# Patient Record
Sex: Female | Born: 1960 | Race: Black or African American | Hispanic: No | Marital: Married | Smoking: Never smoker
Health system: Southern US, Community
[De-identification: ages and names within clinical notes are randomized; demographics above are authoritative.]

## PROBLEM LIST (undated history)

## (undated) DIAGNOSIS — E559 Vitamin D deficiency, unspecified: Secondary | ICD-10-CM

## (undated) DIAGNOSIS — R0602 Shortness of breath: Secondary | ICD-10-CM

## (undated) DIAGNOSIS — K829 Disease of gallbladder, unspecified: Secondary | ICD-10-CM

## (undated) DIAGNOSIS — M549 Dorsalgia, unspecified: Secondary | ICD-10-CM

## (undated) DIAGNOSIS — R739 Hyperglycemia, unspecified: Secondary | ICD-10-CM

## (undated) DIAGNOSIS — E785 Hyperlipidemia, unspecified: Secondary | ICD-10-CM

## (undated) DIAGNOSIS — R7303 Prediabetes: Secondary | ICD-10-CM

## (undated) DIAGNOSIS — D259 Leiomyoma of uterus, unspecified: Secondary | ICD-10-CM

## (undated) DIAGNOSIS — D649 Anemia, unspecified: Secondary | ICD-10-CM

## (undated) DIAGNOSIS — D509 Iron deficiency anemia, unspecified: Secondary | ICD-10-CM

## (undated) DIAGNOSIS — N920 Excessive and frequent menstruation with regular cycle: Secondary | ICD-10-CM

## (undated) HISTORY — DX: Shortness of breath: R06.02

## (undated) HISTORY — DX: Excessive and frequent menstruation with regular cycle: N92.0

## (undated) HISTORY — DX: Disease of gallbladder, unspecified: K82.9

## (undated) HISTORY — DX: Leiomyoma of uterus, unspecified: D25.9

## (undated) HISTORY — DX: Dorsalgia, unspecified: M54.9

## (undated) HISTORY — DX: Anemia, unspecified: D64.9

## (undated) HISTORY — DX: Iron deficiency anemia, unspecified: D50.9

## (undated) HISTORY — PX: TUBAL LIGATION: SHX77

## (undated) HISTORY — DX: Hyperglycemia, unspecified: R73.9

---
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
## 1997-09-22 ENCOUNTER — Other Ambulatory Visit: Admission: RE | Admit: 1997-09-22 | Discharge: 1997-09-22 | Payer: Self-pay | Admitting: Obstetrics and Gynecology

## 1998-04-01 ENCOUNTER — Other Ambulatory Visit: Admission: RE | Admit: 1998-04-01 | Discharge: 1998-04-01 | Payer: Self-pay | Admitting: *Deleted

## 1998-06-16 HISTORY — PX: OTHER SURGICAL HISTORY: SHX169

## 1998-12-10 ENCOUNTER — Other Ambulatory Visit: Admission: RE | Admit: 1998-12-10 | Discharge: 1998-12-10 | Payer: Self-pay | Admitting: Obstetrics and Gynecology

## 1998-12-11 ENCOUNTER — Other Ambulatory Visit: Admission: RE | Admit: 1998-12-11 | Discharge: 1998-12-11 | Payer: Self-pay | Admitting: Obstetrics and Gynecology

## 1999-09-13 ENCOUNTER — Other Ambulatory Visit: Admission: RE | Admit: 1999-09-13 | Discharge: 1999-09-13 | Payer: Self-pay | Admitting: Family Medicine

## 1999-12-03 ENCOUNTER — Other Ambulatory Visit: Admission: RE | Admit: 1999-12-03 | Discharge: 1999-12-03 | Payer: Self-pay | Admitting: Obstetrics and Gynecology

## 2001-08-08 ENCOUNTER — Ambulatory Visit (HOSPITAL_COMMUNITY): Admission: RE | Admit: 2001-08-08 | Discharge: 2001-08-08 | Payer: Self-pay | Admitting: Obstetrics and Gynecology

## 2001-08-08 ENCOUNTER — Encounter: Payer: Self-pay | Admitting: Obstetrics and Gynecology

## 2001-09-03 ENCOUNTER — Other Ambulatory Visit: Admission: RE | Admit: 2001-09-03 | Discharge: 2001-09-03 | Payer: Self-pay | Admitting: Obstetrics and Gynecology

## 2003-01-14 ENCOUNTER — Other Ambulatory Visit: Admission: RE | Admit: 2003-01-14 | Discharge: 2003-01-14 | Payer: Self-pay | Admitting: Obstetrics and Gynecology

## 2003-01-21 ENCOUNTER — Ambulatory Visit (HOSPITAL_COMMUNITY): Admission: RE | Admit: 2003-01-21 | Discharge: 2003-01-21 | Payer: Self-pay | Admitting: Obstetrics and Gynecology

## 2003-01-21 ENCOUNTER — Encounter: Payer: Self-pay | Admitting: Obstetrics and Gynecology

## 2004-05-19 ENCOUNTER — Ambulatory Visit: Payer: Self-pay | Admitting: Family Medicine

## 2004-09-22 ENCOUNTER — Ambulatory Visit: Payer: Self-pay | Admitting: Family Medicine

## 2004-11-22 ENCOUNTER — Other Ambulatory Visit: Admission: RE | Admit: 2004-11-22 | Discharge: 2004-11-22 | Payer: Self-pay | Admitting: Family Medicine

## 2004-11-22 ENCOUNTER — Encounter: Payer: Self-pay | Admitting: Family Medicine

## 2004-11-22 ENCOUNTER — Ambulatory Visit: Payer: Self-pay | Admitting: Family Medicine

## 2005-07-19 ENCOUNTER — Ambulatory Visit (HOSPITAL_COMMUNITY): Admission: RE | Admit: 2005-07-19 | Discharge: 2005-07-19 | Payer: Self-pay | Admitting: *Deleted

## 2006-03-06 ENCOUNTER — Ambulatory Visit: Payer: Self-pay | Admitting: Family Medicine

## 2006-03-14 ENCOUNTER — Ambulatory Visit: Payer: Self-pay | Admitting: Family Medicine

## 2006-03-20 ENCOUNTER — Ambulatory Visit (HOSPITAL_COMMUNITY): Admission: RE | Admit: 2006-03-20 | Discharge: 2006-03-20 | Payer: Self-pay | Admitting: *Deleted

## 2007-02-27 ENCOUNTER — Encounter: Payer: Self-pay | Admitting: Family Medicine

## 2007-02-27 DIAGNOSIS — N920 Excessive and frequent menstruation with regular cycle: Secondary | ICD-10-CM

## 2007-02-27 DIAGNOSIS — J309 Allergic rhinitis, unspecified: Secondary | ICD-10-CM | POA: Insufficient documentation

## 2007-02-27 DIAGNOSIS — D509 Iron deficiency anemia, unspecified: Secondary | ICD-10-CM | POA: Insufficient documentation

## 2007-03-19 ENCOUNTER — Ambulatory Visit: Payer: Self-pay | Admitting: Family Medicine

## 2007-03-19 ENCOUNTER — Other Ambulatory Visit: Admission: RE | Admit: 2007-03-19 | Discharge: 2007-03-19 | Payer: Self-pay | Admitting: Family Medicine

## 2007-03-19 ENCOUNTER — Encounter (INDEPENDENT_AMBULATORY_CARE_PROVIDER_SITE_OTHER): Payer: Self-pay | Admitting: Internal Medicine

## 2007-03-19 DIAGNOSIS — E669 Obesity, unspecified: Secondary | ICD-10-CM

## 2007-03-19 DIAGNOSIS — N926 Irregular menstruation, unspecified: Secondary | ICD-10-CM

## 2007-03-21 LAB — CONVERTED CEMR LAB
ALT: 21 units/L (ref 0–35)
BUN: 5 mg/dL — ABNORMAL LOW (ref 6–23)
Basophils Absolute: 0 10*3/uL (ref 0.0–0.1)
CO2: 29 meq/L (ref 19–32)
Chloride: 103 meq/L (ref 96–112)
Eosinophils Absolute: 0.1 10*3/uL (ref 0.0–0.6)
Eosinophils Relative: 0.9 % (ref 0.0–5.0)
GFR calc Af Amer: 116 mL/min
Glucose, Bld: 104 mg/dL — ABNORMAL HIGH (ref 70–99)
HCT: 38.2 % (ref 36.0–46.0)
Hemoglobin: 13.2 g/dL (ref 12.0–15.0)
MCHC: 34.5 g/dL (ref 30.0–36.0)
MCV: 79.3 fL (ref 78.0–100.0)
Neutro Abs: 5.4 10*3/uL (ref 1.4–7.7)
Potassium: 3.7 meq/L (ref 3.5–5.1)
RBC: 4.82 M/uL (ref 3.87–5.11)
Sodium: 138 meq/L (ref 135–145)
Total CHOL/HDL Ratio: 4.8
Triglycerides: 71 mg/dL (ref 0–149)

## 2007-03-23 ENCOUNTER — Encounter (INDEPENDENT_AMBULATORY_CARE_PROVIDER_SITE_OTHER): Payer: Self-pay | Admitting: *Deleted

## 2007-03-26 ENCOUNTER — Encounter: Admission: RE | Admit: 2007-03-26 | Discharge: 2007-03-26 | Payer: Self-pay | Admitting: Family Medicine

## 2007-04-02 ENCOUNTER — Encounter: Admission: RE | Admit: 2007-04-02 | Discharge: 2007-04-02 | Payer: Self-pay | Admitting: Family Medicine

## 2007-04-04 ENCOUNTER — Encounter (INDEPENDENT_AMBULATORY_CARE_PROVIDER_SITE_OTHER): Payer: Self-pay | Admitting: *Deleted

## 2007-12-17 ENCOUNTER — Ambulatory Visit: Payer: Self-pay | Admitting: Family Medicine

## 2007-12-17 DIAGNOSIS — N926 Irregular menstruation, unspecified: Secondary | ICD-10-CM | POA: Insufficient documentation

## 2007-12-17 DIAGNOSIS — N939 Abnormal uterine and vaginal bleeding, unspecified: Secondary | ICD-10-CM

## 2007-12-18 LAB — CONVERTED CEMR LAB
Basophils Absolute: 0.1 10*3/uL (ref 0.0–0.1)
Eosinophils Absolute: 0.1 10*3/uL (ref 0.0–0.7)
Eosinophils Relative: 1.6 % (ref 0.0–5.0)
HCT: 32.2 % — ABNORMAL LOW (ref 36.0–46.0)
Lymphocytes Relative: 26.1 % (ref 12.0–46.0)
MCHC: 33.9 g/dL (ref 30.0–36.0)
MCV: 77.5 fL — ABNORMAL LOW (ref 78.0–100.0)
Monocytes Absolute: 0.4 10*3/uL (ref 0.1–1.0)
Neutrophils Relative %: 65.4 % (ref 43.0–77.0)
Platelets: 328 10*3/uL (ref 150–400)

## 2008-01-15 ENCOUNTER — Ambulatory Visit: Payer: Self-pay | Admitting: Family Medicine

## 2008-01-16 LAB — CONVERTED CEMR LAB
Basophils Relative: 1 % (ref 0.0–3.0)
Lymphocytes Relative: 29.7 % (ref 12.0–46.0)
Monocytes Absolute: 0.5 10*3/uL (ref 0.1–1.0)
Monocytes Relative: 6.8 % (ref 3.0–12.0)
Neutro Abs: 4.4 10*3/uL (ref 1.4–7.7)
Platelets: 354 10*3/uL (ref 150–400)
RDW: 14 % (ref 11.5–14.6)

## 2008-04-16 ENCOUNTER — Ambulatory Visit: Payer: Self-pay | Admitting: Internal Medicine

## 2008-04-16 LAB — CONVERTED CEMR LAB: Beta hcg, urine, semiquantitative: NEGATIVE

## 2008-05-16 HISTORY — PX: ENDOMETRIAL ABLATION: SHX621

## 2009-04-22 ENCOUNTER — Other Ambulatory Visit: Admission: RE | Admit: 2009-04-22 | Discharge: 2009-04-22 | Payer: Self-pay | Admitting: Family Medicine

## 2009-04-22 ENCOUNTER — Encounter (INDEPENDENT_AMBULATORY_CARE_PROVIDER_SITE_OTHER): Payer: Self-pay | Admitting: Internal Medicine

## 2009-04-22 ENCOUNTER — Ambulatory Visit: Payer: Self-pay | Admitting: Family Medicine

## 2009-04-22 DIAGNOSIS — N852 Hypertrophy of uterus: Secondary | ICD-10-CM | POA: Insufficient documentation

## 2009-04-22 LAB — HM PAP SMEAR

## 2009-04-27 ENCOUNTER — Encounter: Admission: RE | Admit: 2009-04-27 | Discharge: 2009-04-27 | Payer: Self-pay | Admitting: Family Medicine

## 2009-04-27 DIAGNOSIS — D259 Leiomyoma of uterus, unspecified: Secondary | ICD-10-CM | POA: Insufficient documentation

## 2009-04-27 HISTORY — PX: OTHER SURGICAL HISTORY: SHX169

## 2009-04-29 ENCOUNTER — Encounter (INDEPENDENT_AMBULATORY_CARE_PROVIDER_SITE_OTHER): Payer: Self-pay | Admitting: Internal Medicine

## 2009-04-29 ENCOUNTER — Telehealth (INDEPENDENT_AMBULATORY_CARE_PROVIDER_SITE_OTHER): Payer: Self-pay | Admitting: Internal Medicine

## 2009-04-29 ENCOUNTER — Encounter: Payer: Self-pay | Admitting: Family Medicine

## 2009-05-05 ENCOUNTER — Ambulatory Visit: Payer: Self-pay | Admitting: Family Medicine

## 2009-05-05 ENCOUNTER — Encounter (INDEPENDENT_AMBULATORY_CARE_PROVIDER_SITE_OTHER): Payer: Self-pay | Admitting: Internal Medicine

## 2009-05-06 ENCOUNTER — Telehealth (INDEPENDENT_AMBULATORY_CARE_PROVIDER_SITE_OTHER): Payer: Self-pay | Admitting: Internal Medicine

## 2009-05-06 LAB — CONVERTED CEMR LAB
Basophils Relative: 1 % (ref 0–1)
Chloride: 106 meq/L (ref 96–112)
Creatinine, Ser: 0.64 mg/dL (ref 0.40–1.20)
Eosinophils Relative: 2 % (ref 0–5)
HCT: 27.7 % — ABNORMAL LOW (ref 36.0–46.0)
HDL: 38 mg/dL — ABNORMAL LOW (ref >39)
MCHC: 31 g/dL (ref 30.0–36.0)
MCV: 72.9 fL — ABNORMAL LOW (ref 78.0–100.0)
Monocytes Relative: 6 % (ref 3–12)
Platelets: 327 10*3/uL (ref 150–400)
Potassium: 4 meq/L (ref 3.5–5.3)
TSH: 2.924 microintl units/mL (ref 0.350–4.500)
Total CHOL/HDL Ratio: 4
VLDL: 14 mg/dL (ref 0–40)
WBC: 7 10*3/uL (ref 4.0–10.5)

## 2009-05-07 ENCOUNTER — Encounter (INDEPENDENT_AMBULATORY_CARE_PROVIDER_SITE_OTHER): Payer: Self-pay | Admitting: Internal Medicine

## 2009-06-09 ENCOUNTER — Ambulatory Visit: Payer: Self-pay | Admitting: Family Medicine

## 2009-06-09 DIAGNOSIS — D6489 Other specified anemias: Secondary | ICD-10-CM | POA: Insufficient documentation

## 2009-06-12 ENCOUNTER — Telehealth: Payer: Self-pay | Admitting: Family Medicine

## 2009-06-12 LAB — CONVERTED CEMR LAB
Eosinophils Relative: 1.3 % (ref 0.0–5.0)
HCT: 34.1 % — ABNORMAL LOW (ref 36.0–46.0)
Hemoglobin: 10.8 g/dL — ABNORMAL LOW (ref 12.0–15.0)
Lymphs Abs: 1.7 10*3/uL (ref 0.7–4.0)
MCV: 78.7 fL (ref 78.0–100.0)
Neutro Abs: 4.3 10*3/uL (ref 1.4–7.7)
Platelets: 325 10*3/uL (ref 150.0–400.0)
RBC: 4.34 M/uL (ref 3.87–5.11)
WBC: 6.4 10*3/uL (ref 4.5–10.5)

## 2009-09-10 ENCOUNTER — Ambulatory Visit: Payer: Self-pay | Admitting: Family Medicine

## 2009-09-10 DIAGNOSIS — N390 Urinary tract infection, site not specified: Secondary | ICD-10-CM

## 2009-09-10 LAB — CONVERTED CEMR LAB
Casts: 0 /lpf
Nitrite: NEGATIVE
Specific Gravity, Urine: 1.01
Urine crystals, microscopic: 0 /hpf
Yeast, UA: 0

## 2009-09-14 LAB — CONVERTED CEMR LAB
Basophils Absolute: 0 10*3/uL (ref 0.0–0.1)
Basophils Relative: 1 % (ref 0–1)
Eosinophils Absolute: 0.1 10*3/uL (ref 0.0–0.7)
Hemoglobin: 10.8 g/dL — ABNORMAL LOW (ref 12.0–15.0)
Lymphs Abs: 1.8 10*3/uL (ref 0.7–4.0)
MCHC: 31.9 g/dL (ref 30.0–36.0)
Neutrophils Relative %: 63 % (ref 43–77)
RDW: 14.4 % (ref 11.5–15.5)
WBC: 6.3 10*3/uL (ref 4.0–10.5)

## 2009-12-30 ENCOUNTER — Ambulatory Visit: Payer: Self-pay | Admitting: Family Medicine

## 2009-12-30 DIAGNOSIS — R233 Spontaneous ecchymoses: Secondary | ICD-10-CM | POA: Insufficient documentation

## 2009-12-30 DIAGNOSIS — D179 Benign lipomatous neoplasm, unspecified: Secondary | ICD-10-CM | POA: Insufficient documentation

## 2009-12-31 ENCOUNTER — Encounter: Payer: Self-pay | Admitting: Family Medicine

## 2009-12-31 LAB — CONVERTED CEMR LAB
Basophils Absolute: 0 10*3/uL (ref 0.0–0.1)
Hemoglobin: 12.5 g/dL (ref 12.0–15.0)
INR: 1.01 (ref <1.50)
Lymphocytes Relative: 30 % (ref 12–46)
Lymphs Abs: 2.2 10*3/uL (ref 0.7–4.0)
MCHC: 32.6 g/dL (ref 30.0–36.0)
MCV: 79.1 fL (ref 78.0–100.0)
Monocytes Relative: 6 % (ref 3–12)
Neutro Abs: 4.6 10*3/uL (ref 1.7–7.7)
Platelets: 301 10*3/uL (ref 150–400)
RDW: 14.8 % (ref 11.5–15.5)
WBC: 7.3 10*3/uL (ref 4.0–10.5)
aPTT: 28 s (ref 24–37)

## 2010-01-12 ENCOUNTER — Ambulatory Visit: Payer: Self-pay | Admitting: Family Medicine

## 2010-02-08 ENCOUNTER — Ambulatory Visit (HOSPITAL_COMMUNITY): Admission: RE | Admit: 2010-02-08 | Discharge: 2010-02-08 | Payer: Self-pay | Admitting: Family Medicine

## 2010-02-08 ENCOUNTER — Ambulatory Visit: Payer: Self-pay | Admitting: Family Medicine

## 2010-02-24 ENCOUNTER — Ambulatory Visit: Payer: Self-pay | Admitting: Family Medicine

## 2010-03-23 ENCOUNTER — Ambulatory Visit: Payer: Self-pay | Admitting: Family Medicine

## 2010-04-28 ENCOUNTER — Encounter
Admission: RE | Admit: 2010-04-28 | Discharge: 2010-04-28 | Payer: Self-pay | Source: Home / Self Care | Attending: Family Medicine | Admitting: Family Medicine

## 2010-05-03 ENCOUNTER — Encounter: Payer: Self-pay | Admitting: Family Medicine

## 2010-05-06 ENCOUNTER — Emergency Department (HOSPITAL_COMMUNITY)
Admission: EM | Admit: 2010-05-06 | Discharge: 2010-05-06 | Payer: Self-pay | Source: Home / Self Care | Admitting: Family Medicine

## 2010-05-13 ENCOUNTER — Encounter: Payer: Self-pay | Admitting: Family Medicine

## 2010-05-31 ENCOUNTER — Ambulatory Visit
Admission: RE | Admit: 2010-05-31 | Discharge: 2010-05-31 | Payer: Self-pay | Source: Home / Self Care | Attending: Family Medicine | Admitting: Family Medicine

## 2010-05-31 ENCOUNTER — Encounter: Payer: Self-pay | Admitting: Family Medicine

## 2010-05-31 DIAGNOSIS — R21 Rash and other nonspecific skin eruption: Secondary | ICD-10-CM | POA: Insufficient documentation

## 2010-06-02 LAB — CONVERTED CEMR LAB
Hemoglobin: 13 g/dL (ref 12.0–15.0)
MCHC: 33.3 g/dL (ref 30.0–36.0)
Neutrophils Relative %: 67 % (ref 43–77)
RBC: 4.86 M/uL (ref 3.87–5.11)

## 2010-06-17 NOTE — Letter (Signed)
Summary: [facility]   Imported By: Lanelle Bal 05/18/2009 12:23:18  _____________________________________________________________________  External Attachment:    Type:   Image     Comment:   External Document

## 2010-06-17 NOTE — Assessment & Plan Note (Signed)
Summary: CHECK KNOT ON ARM,BRUISING/CLE   Vital Signs:  Patient profile:   50 year old female Height:      62.25 inches Weight:      202 pounds BMI:     36.78 Temp:     98.5 degrees F oral Pulse rate:   80 / minute Pulse rhythm:   regular BP sitting:   132 / 80  (left arm) Cuff size:   large  Vitals Entered By: Lewanda Rife LPN (December 30, 2009 3:43 PM) CC: ck knot on left upper arm and generalized bruising   History of Present Illness: has been finding bruises all over her in the past 2 weeks different sizes and ages   went to the beach a month ago  no asa  takes ibuprofen for her menses fairly frequently   after last visit went to gyn  loestrin helped for a while  but this last menses - lasted 2 weeks for the past 2 months  bleediing is lighter but longer and cramps are severe   bruising is just on arms   is not taking iron regularly -- due to abd pain and nausea when she takes it    has a knot on her L arm- does not remember hurting it     Allergies: 1)  ! * Oc 2)  ! * Mirina Iud 3)  Codeine 4)  * Progesterone  Past History:  Past Surgical History: Last updated: 04/29/2009 Caesarean section Tubal ligation Tonsillectomy Uterine US and endo biopsy- neg (06-1998) Endo biopsy- neg (03/1998) pelvic U/S and transvaginal --04/27/2009--fibroids x2  Family History: Last updated: 03/23/2007 Father:  died of MI age 70, DM, ETOH Mother:  living  at 2, DM, uterine cancer, HBP, elevated lipids Siblings: 4 brothers with alcohol issues, 1 with HBP               1 br died at 61mo--pneumonia               2 L&W          3 sisters             1 with HBP, boarderline DM             1--ETOH abuse, mental health issues             1--severe DM  DM-MGM,  MI-0 CVA- 0 Prostate Cancer-0 Breast Cancer-0 Ovarian Cancer-0 Uterine Cancer-0 Colon Cancer-0 Drug/ ETOH Abuse-PGF Depression- 0  Social History: Last updated: 02/27/2007 Marital Status:  Married Children: 3 Occupation: cosmotologist  Risk Factors: Alcohol Use: 0 (04/22/2009) Caffeine Use: 1 (04/22/2009) Exercise: yes (04/22/2009)  Risk Factors: Smoking Status: never (04/22/2009) Passive Smoke Exposure: no (03/23/07)  Past Medical History: Allergic rhinitis Anemia-iron deficiency heavy menses  uterine fibroids  lipoma on L arm   Review of Systems General:  Complains of fatigue; denies chills, fever, loss of appetite, and malaise. Eyes:  Denies blurring and eye irritation. CV:  Denies chest pain or discomfort, palpitations, and shortness of breath with exertion. Resp:  Denies cough, shortness of breath, and wheezing. GI:  Denies abdominal pain, nausea, and vomiting. GU:  Complains of abnormal vaginal bleeding; denies hematuria. Derm:  Denies itching, lesion(s), poor wound healing, and rash. Neuro:  Denies numbness and tingling. Endo:  Denies cold intolerance, excessive thirst, excessive urination, and heat intolerance. Heme:  Denies abnormal bruising and enlarge lymph nodes.  Physical Exam  General:  overweight but generally well appearing  Head:  normocephalic, atraumatic,  and no abnormalities observed.   Eyes:  vision grossly intact, pupils equal, pupils round, and pupils reactive to light.  no conjunctival pallor, injection or icterus  Mouth:  pharynx pink and moist.   Neck:  supple with full rom and no masses or thyromegally, no JVD or carotid bruit  Chest Wall:  No deformities, masses, or tenderness noted. Lungs:  Normal respiratory effort, chest expands symmetrically. Lungs are clear to auscultation, no crackles or wheezes. Heart:  Normal rate and regular rhythm. S1 and S2 normal without gallop, murmur, click, rub or other extra sounds. Abdomen:  mild suprapubic tenderness without rebound or gaurding Msk:  no CVA tenderness  Extremities:  No clubbing, cyanosis, edema, or deformity noted with normal full range of motion of all joints.   Skin:  soft  rubbery 2-3 cm mass on ext upper L arm- nontender  few old ecchymoses on upper arms of different ages no other bruising Cervical Nodes:  No lymphadenopathy noted Inguinal Nodes:  No significant adenopathy Psych:  normal affect, talkative and pleasant    Impression & Recommendations:  Problem # 1:  ECCHYMOSES, SPONTANEOUS (ICD-782.7) Assessment New this is most likely from ibuprofen use for dysmenorrhea check cbc and coags today  if worse or easy bleeding -inst to let me know  Orders: Venipuncture ([address]) Specimen Handling ([address]) T-CBC w/Diff (09811-91478) T-PTT (29562-13086) T-Protime, Auto (57846-96295)  Problem # 2:  OTHER SPECIFIED ANEMIAS (ICD-285.8) Assessment: Unchanged  from heavy menses and non tol to ferrous sulfate will try nu iron to see if better tolerated  ref to gyn for menses that are still difficult to control (even with OC) The following medications were removed from the medication list:    Ferrous Sulfate 325 (65 Fe) Mg Tabs (Ferrous sulfate) .Marland Kitchen... Take one tablet by mouth three times a day. Her updated medication list for this problem includes:    Nu-iron 150 Mg Caps (Polysaccharide iron complex) .Marland Kitchen... 1 by mouth two times a day - can take with meals  Orders: Venipuncture ([address]) Specimen Handling (244[address]) T-CBC w/Diff (02725-36644) T-PTT (03474-25956) T-Protime, Auto (38756-43329)  Hgb: 10.8 (09/10/2009)   Hct: 33.9 (09/10/2009)   Platelets: 292 (09/10/2009) RBC: 4.43 (09/10/2009)   RDW: 14.4 (09/10/2009)   WBC: 6.3 (09/10/2009) MCV: 76.5 (09/10/2009)   MCHC: 31.9 (09/10/2009) TSH: 2.924 (05/05/2009)  Problem # 3:  FIBROIDS, UTERUS (ICD-218.9) Assessment: Unchanged even with OC her menses are painful and heavy pt req ref to new gyn and that was done ? perhaps candidate for ablation or other proceedure? Orders: Gynecologic Referral (Gyn)  Problem # 4:  LIPOMA (ICD-214.9) Assessment: New on L arm -- is asymptomatic  will watch for growth or  pain or other problems   Complete Medication List: 1)  Bl One Daily Plus Iron Tabs (Multiple vitamins-iron) .... Take 1 tablet by mouth once a day 2)  Ibuprofen 200 Mg Tabs (Ibuprofen) .... Otc as directed. 3)  Lo Loestrin Fe 1 Mg-10 Mcg / 10 Mcg Tabs (Norethin-eth estrad-fe biphas) .... As directed 4)  Nu-iron 150 Mg Caps (Polysaccharide iron complex) .Marland Kitchen.. 1 by mouth two times a day - can take with meals  Other Orders: Prescription Created Electronically [phone])  Patient Instructions: 1)  lump on arm feels like a lipoma  2)  if it hurts or gets bigger- please update me 3)  switch to nu iron (px ) as directed  4)  labs today for bruising and anemia  5)  we will do gyn referral at check out  Prescriptions: NU-IRON 150 MG CAPS (POLYSACCHARIDE IRON COMPLEX) 1 by mouth two times a day - can take with meals  #60 x 3   Entered and Authorized by:   Judith Part MD   Signed by:   Judith Part MD on 12/30/2009   Method used:   [facility]        [facility] [facility]       [address]       [place], [place]  [address]       Ph: [phone] or [phone]       Fax: [phone]   RxID:   [id number]   Current Allergies (reviewed today): ! * OC ! * MIRINA IUD CODEINE * PROGESTERONE

## 2010-06-17 NOTE — Progress Notes (Signed)
Summary: follow up  Phone Note Outgoing Call   Summary of Call: Advised pt of lab results, to continue iron.  She said her last period was still quite heavy.  Follow up appt scheduled. Initial call taken by: Lowella Petties CMA,  June 12, 2009 9:48 AM

## 2010-06-17 NOTE — Assessment & Plan Note (Signed)
Summary: sore on foot/alc   Vital Signs:  Patient profile:   50 year old female Height:      62.25 inches Weight:      205.25 pounds BMI:     37.37 Temp:     98.6 degrees F oral Pulse rate:   84 / minute Pulse rhythm:   regular BP sitting:   124 / 80  (left arm) Cuff size:   large  Vitals Entered By: Delilah Shan CMA Gloriajean Okun Dull) (May 31, 2010 2:13 PM) CC: Sore on foot   History of Present Illness: Went to UC for R foot pain- that was likely due to an inflammed tendon and is in PT for this now.  L foot pain started 3 days ago.  Prev with irritation between the toes.  Ibuprofen helped the pain last night.  Temp 101 last night.  No other URI symptoms.  L foot is sore between the toes.   H/o fatigue.  H/o iron def anemia.  Due for labs.   Allergies: 1)  ! * Oc 2)  ! * Mirina Iud 3)  Codeine 4)  * Progesterone  Review of Systems       See HPI.  Otherwise negative.    Physical Exam  General:  no apparent distress normocephalic atraumatic mucous membranes moist regular rate and rhythm clear to auscultation bilaterally L foot with normal perfusion but macerated and irritated tissue in the medial 3 webspaces.  No ulceration that probes to bone- all irritation is superficial.  no fluctuant mass . toes w/o erythema distally but tender on manipulation.    Impression & Recommendations:  Problem # 1:  ANEMIA-IRON DEFICIENCY (ICD-280.9) See notes on labs.  Her updated medication list for this problem includes:    Nu-iron 150 Mg Caps (Polysaccharide iron complex) .Marland Kitchen... 1 by mouth once daily - can take with meals  Orders: Specimen Handling ([address]) Venipuncture ([address]) T-CBC w/Diff (28413-24401) T-Iron (02725-36644)  Problem # 2:  SKIN RASH (ICD-782.1) She wears close toes shoes and she likely has a superficial fungal infection that was superinfected.  I don't think this was the likely source of the fever as she doesn't have any sign of a sig spreading erythema, but given the  fever and exam will cover for skin flora/superinfection along with fungal source.  follow up as needed.  she agrees.  d/w patient about routine preventive measures.  Call back or follow up if not improving, certainly in worse.  She agrees.  Her updated medication list for this problem includes:    Nystatin 100000 Unit/gm Powd (Nystatin) .Marland Kitchen... Aaa two times a day  Orders: Prescription Created Electronically [phone])  Complete Medication List: 1)  Bl One Daily Plus Iron Tabs (Multiple vitamins-iron) .... Take 1 tablet by mouth once a day 2)  Ibuprofen 200 Mg Tabs (Ibuprofen) .... Otc as directed. 3)  Nu-iron 150 Mg Caps (Polysaccharide iron complex) .Marland Kitchen.. 1 by mouth once daily - can take with meals 4)  Nystatin 100000 Unit/gm Powd (Nystatin) .... Aaa two times a day 5)  Clindamycin Hcl 300 Mg Caps (Clindamycin hcl) .Marland Kitchen.. 1 by mouth three times a day  Patient Instructions: 1)  Use the antibiotics as directed and the powder between your toes.  Let me know if you aren't getting better.  I would try to change your socks in the middle of the day to keep your feet dry.  Take care.  2)  We'll contact you with your lab report.  Prescriptions: CLINDAMYCIN HCL 300  MG CAPS (CLINDAMYCIN HCL) 1 by mouth three times a day  #21 x 0   Entered and Authorized by:   Crawford Givens MD   Signed by:   Crawford Givens MD on 05/31/2010   Method used:   [facility]        [facility] ([facility]       [address]       [place], [place]  [address]       Ph: [phone] or [phone]       Fax: [phone]   RxID:   [id number] NYSTATIN 100000 UNIT/GM POWD (NYSTATIN) AAA two times a day  #15gm x 1   Entered and Authorized by:   Crawford Givens MD   Signed by:   Crawford Givens MD on 05/31/2010   Method used:   [facility]        [id number]       [address]       [place], [place]  [address]       Ph: [phone] or [phone]       Fax: [phone]    RxID:   [id number]    Orders Added: 1)  Est. Patient Level III [99213] 2)  Prescription Created Electronically [G8553] 3)  Specimen Handling [99000] 4)  Venipuncture [36415] 5)  T-CBC w/Diff [20254-27062] 6)  T-Iron [37628-31517]    Current Allergies (reviewed today): ! * OC ! * MIRINA IUD CODEINE * PROGESTERONE

## 2010-06-17 NOTE — Letter (Signed)
Summary: Results Follow up Letter  [place] [facility]  [address] [place], [place] [address]   Phone: [phone]  Fax: [phone]    05/03/2010 MRN: [id number]  [facility] [address] [place], [place]  [address]  Dear Victoria [name],  The following are the results of your recent test(s):  Test         Result    Pap Smear:        Normal _____  Not Normal _____ Comments: ______________________________________________________ Cholesterol: LDL(Bad cholesterol):         Your goal is less than:         HDL (Good cholesterol):       Your goal is more than: Comments:  ______________________________________________________ Mammogram:        Normal __X___  Not Normal _____ Comments: Repeat in one year.  ___________________________________________________________________ Hemoccult:        Normal _____  Not normal _______ Comments:    _____________________________________________________________________ Other Tests:    We routinely do not discuss normal results over the telephone.  If you desire a copy of the results, or you have any questions about this information we can discuss them at your next office visit.   Sincerely,

## 2010-06-17 NOTE — Assessment & Plan Note (Signed)
Summary: FOLLOW UP   Vital Signs:  Patient profile:   50 year old female Height:      62.25 inches Weight:      206.50 pounds BMI:     37.60 Temp:     98.2 degrees F oral Pulse rate:   68 / minute Pulse rhythm:   regular BP sitting:   104 / 70  (left arm) Cuff size:   large  Vitals Entered By: Lewanda Rife LPN (September 10, 2009 8:10 AM) CC: follow up and check labs also burning and pain when urinate with frequency   History of Present Illness: here for f/u of anemia and also possible uti  some frequency  some bladder pain-- but not burning to urinate ua dip is pos today    period was last saturday -- really painful and heavy  ? unsure if urine symptoms are related    tried to put a mirena iud in  could not dilate her cervix enough  then got uti from that -- took cipro 30 days ago - but could tell not totally cleared up -- had to take it back out -- cramps for 2 weeks   ? what to do  is perhaps considering hysterectomy   is still taking 800 mg for menstrual cramps- does not even help anymore   is taking iron pill  fem? something      Allergies: 1)  ! * Oc 2)  ! * Mirina Iud 3)  Codeine 4)  * Progesterone  Past History:  Past Surgical History: Last updated: 04/29/2009 Caesarean section Tubal ligation Tonsillectomy Uterine US and endo biopsy- neg (06-1998) Endo biopsy- neg (03/1998) pelvic U/S and transvaginal --04/27/2009--fibroids x2  Family History: Last updated: 04/05/07 Father:  died of MI age 69, DM, ETOH Mother:  living  at 78, DM, uterine cancer, HBP, elevated lipids Siblings: 4 brothers with alcohol issues, 1 with HBP               1 br died at 5mo--pneumonia               2 L&W          3 sisters             1 with HBP, boarderline DM             1--ETOH abuse, mental health issues             1--severe DM  DM-MGM,  MI-0 CVA- 0 Prostate Cancer-0 Breast Cancer-0 Ovarian Cancer-0 Uterine Cancer-0 Colon Cancer-0 Drug/ ETOH  Abuse-PGF Depression- 0  Social History: Last updated: 02/27/2007 Marital Status: Married Children: 3 Occupation: cosmotologist  Risk Factors: Alcohol Use: 0 (04/22/2009) Caffeine Use: 1 (04/22/2009) Exercise: yes (04/22/2009)  Risk Factors: Smoking Status: never (04/22/2009) Passive Smoke Exposure: no (04/05/2007)  Past Medical History: Allergic rhinitis Anemia-iron deficiency heavy menses  uterine fibroids   Physical Exam  General:  overweight but generally well appearing  Head:  normocephalic, atraumatic, and no abnormalities observed.   Eyes:  vision grossly intact, pupils equal, pupils round, and pupils reactive to light.  no conjunctival pallor, injection or icterus  Mouth:  pharynx pink and moist.   Neck:  supple with full rom and no masses or thyromegally, no JVD or carotid bruit  Chest Wall:  No deformities, masses, or tenderness noted. Lungs:  Normal respiratory effort, chest expands symmetrically. Lungs are clear to auscultation, no crackles or wheezes. Heart:  Normal rate and regular rhythm. S1  and S2 normal without gallop, murmur, click, rub or other extra sounds. Abdomen:  suprapubic tenderness without rebound or gaurding   Msk:  no CVA tenderness  Extremities:  No clubbing, cyanosis, edema, or deformity noted with normal full range of motion of all joints.   Skin:  Intact without suspicious lesions or rashes no skin pallor   Cervical Nodes:  No lymphadenopathy noted Inguinal Nodes:  No significant adenopathy Psych:  normal affect, talkative and pleasant    Impression & Recommendations:  Problem # 1:  UTI (ICD-599.0) Assessment New pt unsure if last uti was totally clear- so did cx tx with keflex and update  enc water intake pt advised to update me if symptoms worsen or do not improve  Her updated medication list for this problem includes:    Keflex 250 Mg Caps (Cephalexin) .Marland Kitchen... 1 by mouth two times a day for 7 days  Orders: T-Culture, Urine  ([id number]) Prescription Created Electronically [phone]) UA Dipstick W/ Micro (manual) ([address])  Problem # 2:  ANEMIA-IRON DEFICIENCY (ICD-280.9) Assessment: Unchanged  from menses on iron daily-- pt does not remember name or dose lab today may be down due to heavy period last week Orders: Venipuncture (010[address]) Prescription Created Electronically [phone])  Hgb: 10.8 (06/09/2009)   Hct: 34.1 (06/09/2009)   Platelets: 325.0 (06/09/2009) RBC: 4.34 (06/09/2009)   RDW: 18.9 H % (06/09/2009)   WBC: 6.4 (06/09/2009) MCV: 78.7 (06/09/2009)   MCHC: 31.7 (06/09/2009) TSH: 2.924 (05/05/2009)  Her updated medication list for this problem includes:    Ferrous Sulfate 325 (65 Fe) Mg Tabs (Ferrous sulfate) .Marland Kitchen... Take one tablet by mouth three times a day.  Problem # 3:  Hx of MENORRHAGIA (ICD-626.2) Assessment: Deteriorated with fibroids and pain  no luck with mirina iud, and into to oc  will ref back to gyn to disc other opt for tx  Orders: Venipuncture (440[address]) Specimen Handling (74259) T-CBC w/Diff ([id number]) Gynecologic Referral (Gyn) Prescription Created Electronically [phone])  Complete Medication List: 1)  Bl One Daily Plus Iron Tabs (Multiple vitamins-iron) .... Take 1 tablet by mouth once a day 2)  Keflex 250 Mg Caps (Cephalexin) .Marland Kitchen.. 1 by mouth two times a day for 7 days 3)  Ferrous Sulfate 325 (65 Fe) Mg Tabs (Ferrous sulfate) .... Take one tablet by mouth three times a day.  Patient Instructions: 1)  continue drinking lots of water 2)  call or seek care is symptoms don't improve in 2-3 days or if you develop back pain, nausea, or vomiting  3)  take keflex as directed -- I sent to your pharmacy  4)  we will do gyn ref at check out  5)  continue iron  Prescriptions: KEFLEX 250 MG CAPS (CEPHALEXIN) 1 by mouth two times a day for 7 days  #14 x 0   Entered and Authorized by:   Judith Part MD   Signed by:   Judith Part MD on 09/10/2009   Method used:   [facility]  [facility]        [id number]       [address]       [place], [place]  [address]       Ph: [phone] or [phone]       Fax: [phone]   RxID:   [id number]   Current Allergies (reviewed today): ! * OC ! * MIRINA IUD CODEINE * PROGESTERONE  Laboratory Results   Urine Tests  Date/Time Received: September 10, 2009 8:12  AM  Date/Time Reported: September 10, 2009 8:12 AM   Routine Urinalysis   Color: lt. yellow Appearance: Hazy Glucose: negative   (Normal Range: Negative) Bilirubin: negative   (Normal Range: Negative) Ketone: negative   (Normal Range: Negative) Spec. Gravity: 1.010   (Normal Range: 1.003-1.035) Blood: large   (Normal Range: Negative) pH: 6.0   (Normal Range: 5.0-8.0) Protein: trace   (Normal Range: Negative) Urobilinogen: 0.2   (Normal Range: 0-1) Nitrite: negative   (Normal Range: Negative) Leukocyte Esterace: small   (Normal Range: Negative)  Urine Microscopic WBC/HPF: 2-3 RBC/HPF: 3-4 Bacteria/HPF: mild Mucous/HPF: few Epithelial/HPF: 1-2 Crystals/HPF: 0 Casts/LPF: 0 Yeast/HPF: 0 Other: 0

## 2010-06-17 NOTE — Letter (Signed)
Summary: [facility]   Imported By: Lanelle Bal 05/25/2010 11:04:17  _____________________________________________________________________  External Attachment:    Type:   Image     Comment:   External Document

## 2010-06-17 NOTE — Miscellaneous (Signed)
Summary: med list updated  Clinical Lists Changes  Medications: Changed medication from NU-IRON 150 MG CAPS (POLYSACCHARIDE IRON COMPLEX) 1 by mouth two times a day - can take with meals to NU-IRON 150 MG CAPS (POLYSACCHARIDE IRON COMPLEX) 1 by mouth once daily - can take with meals     Current Allergies: ! * OC ! * MIRINA IUD CODEINE * PROGESTERONE

## 2010-07-29 LAB — CBC
HCT: 37.5 % (ref 36.0–46.0)
Hemoglobin: 12.6 g/dL (ref 12.0–15.0)
MCH: 27.2 pg (ref 26.0–34.0)
MCV: 80.6 fL (ref 78.0–100.0)
Platelets: 283 10*3/uL (ref 150–400)
RBC: 4.64 MIL/uL (ref 3.87–5.11)
RDW: 15.2 % (ref 11.5–15.5)
WBC: 6.8 10*3/uL (ref 4.0–10.5)

## 2010-07-29 LAB — PREGNANCY, URINE: Preg Test, Ur: NEGATIVE

## 2010-09-28 NOTE — Assessment & Plan Note (Signed)
[name], [name]                ACCOUNT NO.:  [id number]   MEDICAL RECORD NO.:  [id number]          PATIENT TYPE:  POB   LOCATION:  [facility]         FACILITY:  [facility]   PHYSICIAN:  Tinnie Gens, MD        DATE OF BIRTH:  [birth year]   DATE OF SERVICE:  01/12/2010                                  CLINIC NOTE   CHIEF COMPLAINT:  Heavy bleeding and pelvic pain.   HISTORY OF PRESENT ILLNESS:  The patient is a 50 year old G3, P3 who  comes in today after being referred from [facility].  She has history  of abnormal uterine bleeding and pelvic pain.  She has been seen  previously by [facility], where she had  an attempted Mirena been placed, but hurt so bad and then she had it  removed within a week.  Additionally, she is on birth control pills,  which have not controlled her bleeding and in fact, her bleeding is  worse, especially for the past 14 days.  She has had a low hemoglobin in  the past, it has required iron which has helped but she really wants to  stop hurting and have some more definitive therapy done.   PAST MEDICAL HISTORY:  Negative.   PAST SURGICAL HISTORY:  Tonsillectomy, C-section x3, and tubal ligation.   MEDICATIONS:  She is on Nu-Iron, oral contraceptive, ibuprofen as  needed.   ALLERGIES:  CODEINE which causes nausea, vomiting only.   OBSTETRICAL HISTORY:  G3, P3 with 3 cesarean sections.   GYNECOLOGIC HISTORY:  No history of abnormal Pap smear.   FAMILY HISTORY:  Coronary artery disease, diabetes, hypertension.   SOCIAL HISTORY:  The patient is married.  She has three children.  She  works as a Associate Professor.  She denies alcohol, tobacco, or other drug  use.   PHYSICAL EXAMINATION:  GENERAL:  The patient is a well-developed, well-  nourished female in no acute distress.  VITAL SIGNS:  Blood pressure is 126/78, weight is 202, height 5 feet 3  inches, pulse 78.  HEENT:  Normocephalic, atraumatic.  Sclerae  anicteric.  NECK:  Supple.  Normal thyroid.  ABDOMEN:  Soft, nontender, nondistended.   Results of previous findings include a pelvic sonogram that showed  endometrial stripe of less than 4 mm with 2 fibroids.  She has not had  endometrial biopsy that I can tell since 2000.  She had negative  Gonorrhea and Chlamydia in December of last year.  She has had a normal  Pap in January 2011.   IMPRESSION:  Abnormal uterine bleeding, pelvic pain.   PLAN:  After a full discussion of all options, given that she is self-  employed and she has failed 2 conservative therapies, the patient has  elected to proceed with D and C, hysteroscopy followed by hydrothermal  ablation.  Hopefully, this will help with pain and bleeding problems.           ______________________________  Tinnie Gens, MD     TP/MEDQ  D:  01/12/2010  T:  01/13/2010  Job:  [id number]

## 2010-09-28 NOTE — Assessment & Plan Note (Signed)
NAMEJAYMARIE, [name]                ACCOUNT NO.:  [id number]   MEDICAL RECORD NO.:  [id number]          PATIENT TYPE:  POB   LOCATION:  [facility] at [facility]         FACILITY:  [facility]   PHYSICIAN:  Tinnie Gens, MD        DATE OF BIRTH:  1961/02/05   DATE OF SERVICE:                                  CLINIC NOTE   CHIEF COMPLAINT:  Postop check.   HISTORY OF PRESENT ILLNESS:  The patient is a 50 year old gravida 3,  para 3 who returns after having a ThermaChoice endometrial ablation.  She had a D and C, hysteroscopy at that time which showed submucosal  fibroids x2 and endometrial polyp.  Endometrial curettings were sent to  Pathology.  The results showed benign weekly proliferative endometrium  with no hyperplasia or malignancy identified.  She reports bleeding a  day or two after the procedure and then having a watery discharge for  couple of weeks later that was fairly foul-smelling which is indicative  of loss of endometrial lining.  The patient had a bit of a cycle on last  week that was very light and with no cramping.  She also has no cramping  now, she was having daily prior to this procedure.   PHYSICAL EXAMINATION:  GENERAL/VITAL SIGNS:  On exam today, vitals are  as noted in the chart.  She is a well-developed well-nourished female in  no acute distress.  ABDOMEN:  Soft, nontender, and nondistended.   IMPRESSION:  Postop from a ThermaChoice endometrial ablation, doing  well.   PLAN:  Resume normal activities and she will call if she needs anything  further.           ______________________________  Tinnie Gens, MD     TP/MEDQ  D:  03/23/2010  T:  03/24/2010  Job:  [id number]

## 2010-10-27 ENCOUNTER — Ambulatory Visit (INDEPENDENT_AMBULATORY_CARE_PROVIDER_SITE_OTHER): Payer: Self-pay | Admitting: Family Medicine

## 2010-10-27 ENCOUNTER — Encounter: Payer: Self-pay | Admitting: Family Medicine

## 2010-10-27 VITALS — BP 120/74 | HR 80 | Temp 99.2°F | Wt 205.2 lb

## 2010-10-27 DIAGNOSIS — R300 Dysuria: Secondary | ICD-10-CM

## 2010-10-27 DIAGNOSIS — N390 Urinary tract infection, site not specified: Secondary | ICD-10-CM

## 2010-10-27 LAB — POCT URINALYSIS DIPSTICK
Bilirubin, UA: NEGATIVE
Glucose, UA: NEGATIVE
Protein, UA: NEGATIVE

## 2010-10-27 MED ORDER — SULFAMETHOXAZOLE-TRIMETHOPRIM 800-160 MG PO TABS: 1.0000 | ORAL_TABLET | Freq: Two times a day (BID) | ORAL | Status: AC

## 2010-10-27 NOTE — Progress Notes (Signed)
She had endometrial ablation for heavy periods.   Dysuria: yes- no burning but has some frequency and lower abd pain anteriorly duration of symptoms: 3 week, some days better than others, Inc in po fluids in meantime.   abdominal pain: cramping back pain: not with new symptoms.   Vomiting: no but occ nausea other concerns: prev with ST, now resolved   Meds, vitals, and allergies reviewed.   ROS: See HPI.  Otherwise negative.    GEN: nad, alert and oriented HEENT: mucous membranes moist NECK: supple CV: rrr.  PULM: ctab, no inc wob ABD: soft, +bs, suprapubic area tender EXT: no edema SKIN: no acute rash BACK: no CVA pain

## 2010-10-27 NOTE — Patient Instructions (Signed)
Drink plenty of water, start the antibiotics, and let us know if you don't get better.  We'll contact you with your lab report.

## 2010-10-27 NOTE — Assessment & Plan Note (Signed)
Start septra, check ucx and fu prn.  Okay for outpatient fu.  Nontoxic. D/w pt and she agrees.  Inc in PO fluids in meantime.

## 2011-03-22 ENCOUNTER — Other Ambulatory Visit: Payer: Self-pay | Admitting: Family Medicine

## 2011-03-22 DIAGNOSIS — Z1231 Encounter for screening mammogram for malignant neoplasm of breast: Secondary | ICD-10-CM

## 2011-05-02 ENCOUNTER — Ambulatory Visit
Admission: RE | Admit: 2011-05-02 | Discharge: 2011-05-02 | Disposition: A | Discharge status: Home / Self Care | Payer: No Typology Code available for payment source | Source: Ambulatory Visit | Attending: Family Medicine | Admitting: Family Medicine

## 2011-05-02 DIAGNOSIS — Z1231 Encounter for screening mammogram for malignant neoplasm of breast: Secondary | ICD-10-CM

## 2011-05-05 ENCOUNTER — Other Ambulatory Visit: Payer: Self-pay | Admitting: Family Medicine

## 2011-05-05 DIAGNOSIS — R928 Other abnormal and inconclusive findings on diagnostic imaging of breast: Secondary | ICD-10-CM

## 2011-05-19 ENCOUNTER — Ambulatory Visit
Admission: RE | Admit: 2011-05-19 | Discharge: 2011-05-19 | Disposition: A | Discharge status: Home / Self Care | Payer: No Typology Code available for payment source | Source: Ambulatory Visit | Attending: Family Medicine | Admitting: Family Medicine

## 2011-05-19 DIAGNOSIS — R928 Other abnormal and inconclusive findings on diagnostic imaging of breast: Secondary | ICD-10-CM

## 2011-05-26 ENCOUNTER — Other Ambulatory Visit: Payer: Self-pay | Admitting: Family Medicine

## 2011-05-26 DIAGNOSIS — N6009 Solitary cyst of unspecified breast: Secondary | ICD-10-CM

## 2011-06-01 ENCOUNTER — Telehealth: Payer: Self-pay | Admitting: Family Medicine

## 2011-06-01 DIAGNOSIS — Z0000 Encounter for general adult medical examination without abnormal findings: Secondary | ICD-10-CM | POA: Insufficient documentation

## 2011-06-01 DIAGNOSIS — Z0001 Encounter for general adult medical examination with abnormal findings: Secondary | ICD-10-CM | POA: Insufficient documentation

## 2011-06-01 NOTE — Telephone Encounter (Signed)
Message copied by Judy Pimple on Wed Jun 01, 2011  5:40 PM ------      Message from: Baldomero Lamy      Created: Wed May 25, 2011  8:26 AM      Regarding: cpx labs Thurs1/17/13       Please order  future cpx labs for pt's upcomming lab appt.      Thanks      Rodney Booze

## 2011-06-02 ENCOUNTER — Other Ambulatory Visit (INDEPENDENT_AMBULATORY_CARE_PROVIDER_SITE_OTHER): Payer: No Typology Code available for payment source

## 2011-06-02 DIAGNOSIS — Z0000 Encounter for general adult medical examination without abnormal findings: Secondary | ICD-10-CM

## 2011-06-02 LAB — CBC WITH DIFFERENTIAL/PLATELET
Basophils Absolute: 0 10*3/uL (ref 0.0–0.1)
HCT: 39.1 % (ref 36.0–46.0)
Lymphs Abs: 1.9 10*3/uL (ref 0.7–4.0)
MCV: 84.4 fl (ref 78.0–100.0)
Monocytes Absolute: 0.4 10*3/uL (ref 0.1–1.0)
Platelets: 261 10*3/uL (ref 150.0–400.0)
RDW: 13.5 % (ref 11.5–14.6)

## 2011-06-02 LAB — COMPREHENSIVE METABOLIC PANEL
ALT: 22 U/L (ref 0–35)
AST: 21 U/L (ref 0–37)
Alkaline Phosphatase: 55 U/L (ref 39–117)
Sodium: 135 mEq/L (ref 135–145)
Total Bilirubin: 0.6 mg/dL (ref 0.3–1.2)
Total Protein: 7 g/dL (ref 6.0–8.3)

## 2011-06-02 LAB — LIPID PANEL
Cholesterol: 199 mg/dL (ref 0–200)
LDL Cholesterol: 140 mg/dL — ABNORMAL HIGH (ref 0–99)
VLDL: 18 mg/dL (ref 0.0–40.0)

## 2011-06-02 LAB — TSH: TSH: 2.45 u[IU]/mL (ref 0.35–5.50)

## 2011-06-08 ENCOUNTER — Encounter: Payer: Self-pay | Admitting: Family Medicine

## 2011-06-08 ENCOUNTER — Other Ambulatory Visit (HOSPITAL_COMMUNITY)
Admission: RE | Admit: 2011-06-08 | Discharge: 2011-06-08 | Disposition: A | Discharge status: Home / Self Care | Payer: No Typology Code available for payment source | Source: Ambulatory Visit | Attending: Family Medicine | Admitting: Family Medicine

## 2011-06-08 ENCOUNTER — Encounter: Payer: Self-pay | Admitting: Gastroenterology

## 2011-06-08 ENCOUNTER — Ambulatory Visit (INDEPENDENT_AMBULATORY_CARE_PROVIDER_SITE_OTHER): Payer: No Typology Code available for payment source | Admitting: Family Medicine

## 2011-06-08 VITALS — BP 120/72 | HR 76 | Temp 97.9°F | Ht 63.0 in | Wt 203.0 lb

## 2011-06-08 DIAGNOSIS — E785 Hyperlipidemia, unspecified: Secondary | ICD-10-CM | POA: Insufficient documentation

## 2011-06-08 DIAGNOSIS — E669 Obesity, unspecified: Secondary | ICD-10-CM

## 2011-06-08 DIAGNOSIS — Z01419 Encounter for gynecological examination (general) (routine) without abnormal findings: Secondary | ICD-10-CM | POA: Insufficient documentation

## 2011-06-08 DIAGNOSIS — Z0000 Encounter for general adult medical examination without abnormal findings: Secondary | ICD-10-CM

## 2011-06-08 DIAGNOSIS — Z1159 Encounter for screening for other viral diseases: Secondary | ICD-10-CM | POA: Insufficient documentation

## 2011-06-08 DIAGNOSIS — Z1211 Encounter for screening for malignant neoplasm of colon: Secondary | ICD-10-CM

## 2011-06-08 DIAGNOSIS — Z23 Encounter for immunization: Secondary | ICD-10-CM

## 2011-06-08 DIAGNOSIS — D259 Leiomyoma of uterus, unspecified: Secondary | ICD-10-CM

## 2011-06-08 LAB — HM PAP SMEAR: HM Pap smear: NORMAL

## 2011-06-08 NOTE — Assessment & Plan Note (Signed)
Exam done with pap  No changes Known fibroids No menses (ablation and likely menopause)

## 2011-06-08 NOTE — Assessment & Plan Note (Signed)
Overall mild with LDL in 140s- but up significantly from last check Suspect from holiday eating  Rev high sat fat foods to avoid and handout given  Will re check in a year

## 2011-06-08 NOTE — Patient Instructions (Addendum)
Flu shot today Pap smear today  We will set up colonoscopy at check out  Try to eat a healthy diet and get good exercise for weight loss Avoid red meat/ fried foods/ egg yolks/ fatty breakfast meats/ butter, cheese and high fat dairy/ and shellfish  - for cholesterol

## 2011-06-08 NOTE — Assessment & Plan Note (Signed)
Disc need for wt loss for better health Rev low fat and reduced portion diet Exercise is good -inst to gradually inc to 5 d per week if possible

## 2011-06-08 NOTE — Assessment & Plan Note (Signed)
Set up screening colonosc since pt is 50

## 2011-06-08 NOTE — Progress Notes (Signed)
Subjective:    Patient ID: Victoria [name], female    DOB: 09-Aug-1960, 51 y.o.   MRN: [id number]  HPI Here for health maintenance exam and to review chronic medical problems   Is doing well  Feels fine -- nothing new going on   bp good 120/72 Wt is down 2 lb with bmi of 33 - obese Diet-- is always working on it  Exercise- is walking on the treadmill -- between 2-4 days 45 minutes   Lipids  Lab Results  Component Value Date   CHOL 199 06/02/2011   CHOL 153 05/05/2009   CHOL 200 03/19/2007   Lab Results  Component Value Date   HDL 41.40 06/02/2011   HDL 38* 05/05/2009   HDL 41.7 03/19/2007   Lab Results  Component Value Date   LDLCALC 140* 06/02/2011   LDLCALC 101* 05/05/2009   LDLCALC 144* 03/19/2007   Lab Results  Component Value Date   TRIG 90.0 06/02/2011   TRIG 69 05/05/2009   TRIG 71 03/19/2007   Lab Results  Component Value Date   CHOLHDL 5 06/02/2011   CHOLHDL 4.0 Ratio 05/05/2009   CHOLHDL 4.8 CALC 03/19/2007   No results found for this basename: LDLDIRECT   these are up  No red meat/ no fast food/ not a lot of fried foods A lot of cakes with butter over the holidays - too many sweets  occ sausage   Flu shot-did not get  Will get today   Colon cancer screen  Needs to schedule colonoscopy   Pap 12/10- normal  Has fibroids- can feel them when she bends over  Had the ablation  -- and menses stopped in august        Lab Results  Component Value Date   WBC 6.9 06/02/2011   HGB 13.4 06/02/2011   HCT 39.1 06/02/2011   MCV 84.4 06/02/2011   PLT 261.0 06/02/2011   no hot flashes at all  BTL in past  Has had pelvic US  Mother had uterine cancer   Patient Active Problem List  Diagnoses  . LIPOMA  . FIBROIDS, UTERUS  . OBESITY  . OTHER SPECIFIED ANEMIAS  . ALLERGIC RHINITIS  . UTERINE ENLARGEMENT  . MENORRHAGIA  . ECCHYMOSES, SPONTANEOUS  . Routine general medical examination at a health care facility  . Gynecological examination  . Special screening  for malignant neoplasms, colon  . Hyperlipidemia   Past Medical History  Diagnosis Date  . Allergic rhinitis   . Anemia, iron deficiency   . Heavy menses   . Uterine fibroid   . Lipoma     on L arm   Past Surgical History  Procedure Date  . Cesarean section   . Tubal ligation   . Tonsillectomy   . Uterine ultrasound 06/1998    and endo biopsy- neg  . Pelvic u/s and transvaginal 04/27/2009    fibroids x 2  . Endometrial ablation     fibroids   History  Substance Use Topics  . Smoking status: Never Smoker   . Smokeless tobacco: Never Used  . Alcohol Use: No   Family History  Problem Relation Age of Onset  . Diabetes Mother   . Hypertension Mother   . Hyperlipidemia Mother   . Cancer Mother     uterine cancer  . Diabetes Father   . Alcohol abuse Father   . Hypertension Sister   . Diabetes Sister     boarderline  . Alcohol abuse Brother   .  Hypertension Brother   . Diabetes Maternal Grandmother   . Alcohol abuse Paternal Grandfather   . Alcohol abuse Brother   . Alcohol abuse Brother   . Alcohol abuse Brother   . Alcohol abuse Sister   . Diabetes Sister     severe   Allergies  Allergen Reactions  . Codeine     REACTION: nausea and vomiting   Current Outpatient Prescriptions on File Prior to Visit  Medication Sig Dispense Refill  . ibuprofen (ADVIL,MOTRIN) 200 MG tablet Take 200 mg by mouth as needed.              Review of Systems Review of Systems  Constitutional: Negative for fever, appetite change, fatigue and unexpected weight change.  Eyes: Negative for pain and visual disturbance.  Respiratory: Negative for cough and shortness of breath.   Cardiovascular: Negative for cp or palpitations    Gastrointestinal: Negative for nausea, diarrhea and constipation.  Genitourinary: Negative for urgency and frequency. neg for vaginal bleeding Skin: Negative for pallor or rash   Neurological: Negative for weakness, light-headedness, numbness and  headaches.  Hematological: Negative for adenopathy. Does not bruise/bleed easily.  Psychiatric/Behavioral: Negative for dysphoric mood. The patient is not nervous/anxious.          Objective:   Physical Exam  Constitutional: She appears well-developed and well-nourished. No distress.       Obese and well appearing   HENT:  Head: Normocephalic and atraumatic.  Right Ear: External ear normal.  Left Ear: External ear normal.  Nose: Nose normal.  Mouth/Throat: Oropharynx is clear and moist.  Eyes: Conjunctivae and EOM are normal. Pupils are equal, round, and reactive to light. No scleral icterus.  Neck: Normal range of motion. Neck supple. No JVD present. Carotid bruit is not present. No thyromegaly present.  Cardiovascular: Normal rate, regular rhythm and normal heart sounds.   No murmur heard. Pulmonary/Chest: Effort normal and breath sounds normal. No respiratory distress. She has no wheezes.  Abdominal: Soft. Bowel sounds are normal. She exhibits no distension and no mass. There is no tenderness.  Genitourinary: Rectum normal and vagina normal. No breast swelling, tenderness, discharge or bleeding. There is no rash or lesion on the right labia. There is no rash or lesion on the left labia. Uterus is enlarged. Uterus is not tender. Cervix exhibits no motion tenderness, no discharge and no friability. Right adnexum displays no mass, no tenderness and no fullness. Left adnexum displays no mass and no fullness. No tenderness or bleeding around the vagina. No vaginal discharge found.       Breast exam: No mass, nodules, thickening, tenderness, bulging, retraction, inflamation, nipple discharge or skin changes noted.  No axillary or clavicular LA.  Chaperoned exam.  Cyst was not felt on exam   Uterus is enl from fibroids- no change from prev exam   Musculoskeletal: Normal range of motion. She exhibits no edema and no tenderness.  Lymphadenopathy:    She has no cervical adenopathy.    Neurological: She is alert. She has normal reflexes. No cranial nerve deficit. She exhibits normal muscle tone. Coordination normal.  Skin: Skin is warm and dry. No rash noted. No erythema. No pallor.  Psychiatric: She has a normal mood and affect.          Assessment & Plan:

## 2011-06-08 NOTE — Assessment & Plan Note (Signed)
Reviewed health habits including diet and exercise and skin cancer prevention Also reviewed health mt list, fam hx and immunizations  Rev wellness labs Flu shot today Gyn exam today sched screen colonosc  Will have 6 mo re check of breast cyst in 7/13

## 2011-06-08 NOTE — Assessment & Plan Note (Signed)
No change in exam today and no further menses with endom ablation and also menopause  Will continue to follow

## 2011-06-16 ENCOUNTER — Encounter: Payer: Self-pay | Admitting: *Deleted

## 2011-06-30 ENCOUNTER — Encounter (HOSPITAL_COMMUNITY): Payer: Self-pay | Admitting: Emergency Medicine

## 2011-06-30 ENCOUNTER — Emergency Department (HOSPITAL_COMMUNITY)
Admission: EM | Admit: 2011-06-30 | Discharge: 2011-06-30 | Disposition: A | Discharge status: Home / Self Care | Payer: No Typology Code available for payment source | Source: Home / Self Care | Attending: Emergency Medicine | Admitting: Emergency Medicine

## 2011-06-30 DIAGNOSIS — J209 Acute bronchitis, unspecified: Secondary | ICD-10-CM

## 2011-06-30 DIAGNOSIS — J069 Acute upper respiratory infection, unspecified: Secondary | ICD-10-CM

## 2011-06-30 MED ORDER — DOXYCYCLINE HYCLATE 100 MG PO CAPS: 100.0000 mg | ORAL_CAPSULE | Freq: Two times a day (BID) | ORAL | Status: AC

## 2011-06-30 MED ORDER — BENZONATATE 100 MG PO CAPS: ORAL_CAPSULE | ORAL | Status: AC

## 2011-06-30 NOTE — ED Provider Notes (Signed)
History     CSN: [id number]  Arrival date & time 06/30/11  1840   First MD Initiated Contact with Patient 06/30/11 1854      Chief Complaint  Patient presents with  . URI  . Cough    (Consider location/radiation/quality/duration/timing/severity/associated sxs/prior treatment) HPI Comments: Pt states she began 3 weeks ago with nasal congestion and cough. She had fever at onset of symptoms but has resolved. The nasal congestion is improving, but cough is worsening. The cough is productive with yellow/green mucus, and worsens at night when lying flat. No dyspnea or wheezing. She has tried various otc cough and cold medications without improvement.    Past Medical History  Diagnosis Date  . Allergic rhinitis   . Anemia, iron deficiency   . Heavy menses   . Uterine fibroid   . Lipoma     on L arm    Past Surgical History  Procedure Date  . Cesarean section   . Tubal ligation   . Tonsillectomy   . Uterine ultrasound 06/1998    and endo biopsy- neg  . Pelvic u/s and transvaginal 04/27/2009    fibroids x 2  . Endometrial ablation     fibroids    Family History  Problem Relation Age of Onset  . Diabetes Mother   . Hypertension Mother   . Hyperlipidemia Mother   . Cancer Mother     uterine cancer  . Diabetes Father   . Alcohol abuse Father   . Hypertension Sister   . Diabetes Sister     boarderline  . Alcohol abuse Brother   . Hypertension Brother   . Diabetes Maternal Grandmother   . Alcohol abuse Paternal Grandfather   . Alcohol abuse Brother   . Alcohol abuse Brother   . Alcohol abuse Brother   . Alcohol abuse Sister   . Diabetes Sister     severe    History  Substance Use Topics  . Smoking status: Never Smoker   . Smokeless tobacco: Never Used  . Alcohol Use: No    OB History    Grav Para Term Preterm Abortions TAB SAB Ect Mult Living                  Review of Systems  Constitutional: Negative for fever, chills and fatigue.  HENT: Positive  for congestion, rhinorrhea, postnasal drip and sinus pressure. Negative for ear pain, sore throat and sneezing.   Respiratory: Positive for cough. Negative for shortness of breath and wheezing.   Cardiovascular: Negative for chest pain.    Allergies  Codeine  Home Medications   Current Outpatient Rx  Name Route Sig Dispense Refill  . BENZONATATE 100 MG PO CAPS  1-2 caps every 8 hrs prn cough 30 capsule 0  . DOXYCYCLINE HYCLATE 100 MG PO CAPS Oral Take 1 capsule (100 mg total) by mouth 2 (two) times daily. 20 capsule 0  . IBUPROFEN 200 MG PO TABS Oral Take 200 mg by mouth as needed.      Marland Kitchen ONE-DAILY MULTI VITAMINS PO TABS Oral Take 1 tablet by mouth daily.      BP 121/79  Pulse 83  Temp(Src) 98.8 F (37.1 C) (Oral)  Resp 20  SpO2 96%  Physical Exam  Nursing note and vitals reviewed. Constitutional: She appears well-developed and well-nourished. No distress.  HENT:  Head: Normocephalic and atraumatic.  Right Ear: Tympanic membrane, external ear and ear canal normal.  Left Ear: Tympanic membrane, external ear and ear  canal normal.  Nose: Nose normal. Right sinus exhibits no maxillary sinus tenderness. Left sinus exhibits no maxillary sinus tenderness.  Mouth/Throat: Uvula is midline, oropharynx is clear and moist and mucous membranes are normal. No oropharyngeal exudate, posterior oropharyngeal edema or posterior oropharyngeal erythema.  Neck: Neck supple.  Cardiovascular: Normal rate, regular rhythm and normal heart sounds.   Pulmonary/Chest: Effort normal and breath sounds normal. No respiratory distress.  Lymphadenopathy:    She has no cervical adenopathy.  Neurological: She is alert.  Skin: Skin is warm and dry.  Psychiatric: She has a normal mood and affect.    ED Course  Procedures (including critical care time)  Labs Reviewed - No data to display No results found.   1. Acute URI   2. Acute bronchitis       MDM   Worsening productive cough x 3 weeks, with  nasal congestion.        Melody Comas, [place] 06/30/11 1931

## 2011-06-30 NOTE — ED Provider Notes (Signed)
Medical screening examination/treatment/procedure(s) were performed by non-physician practitioner and as supervising physician I was immediately available for consultation/collaboration.  Leslee Home, M.D.   Roque Lias, MD 06/30/11 2016

## 2011-06-30 NOTE — Discharge Instructions (Signed)
Run a vaporizer or humidifier in the bedroom while sleeping. Increase fluids and rest. Take the Doxycycline antibiotic with food. Follow up with Dr Milinda Antis if symptoms persist. Return if symptoms change or worsen.

## 2011-06-30 NOTE — ED Notes (Signed)
PT HERE WITH X 3WEEKS URI/COUGH WITH YELLOW SPUTUM AND NOW SORE THROAT AND DECREASE APPETITE.DENIES SOB.VSS

## 2011-07-11 ENCOUNTER — Ambulatory Visit (AMBULATORY_SURGERY_CENTER): Payer: No Typology Code available for payment source | Admitting: *Deleted

## 2011-07-11 VITALS — Ht 63.0 in | Wt 202.0 lb

## 2011-07-11 DIAGNOSIS — Z1211 Encounter for screening for malignant neoplasm of colon: Secondary | ICD-10-CM

## 2011-07-11 MED ORDER — PEG-KCL-NACL-NASULF-NA ASC-C 100 G PO SOLR: ORAL | Status: DC

## 2011-07-25 ENCOUNTER — Ambulatory Visit (AMBULATORY_SURGERY_CENTER): Payer: No Typology Code available for payment source | Admitting: Gastroenterology

## 2011-07-25 ENCOUNTER — Encounter: Payer: Self-pay | Admitting: Gastroenterology

## 2011-07-25 VITALS — BP 146/75 | HR 72 | Temp 96.6°F | Resp 13 | Ht 63.0 in | Wt 202.0 lb

## 2011-07-25 DIAGNOSIS — Z1211 Encounter for screening for malignant neoplasm of colon: Secondary | ICD-10-CM

## 2011-07-25 DIAGNOSIS — K644 Residual hemorrhoidal skin tags: Secondary | ICD-10-CM

## 2011-07-25 MED ORDER — SODIUM CHLORIDE 0.9 % IV SOLN: 500.0000 mL | INTRAVENOUS | Status: DC

## 2011-07-25 NOTE — Op Note (Signed)
[facility] [address] [place], [place]  [address]  COLONOSCOPY PROCEDURE REPORT  PATIENT:  Victoria, [name]  MR#:  [id number] BIRTHDATE:  [birth year], 50 yrs. old  GENDER:  female ENDOSCOPIST:  Rachael Fee, MD REF. BY:  Marne A. Milinda Antis, M.D. PROCEDURE DATE:  07/25/2011 PROCEDURE:  Colonoscopy 47829 ASA CLASS:  Class II INDICATIONS:  Routine Risk Screening MEDICATIONS:   Fentanyl 75 mcg IV, These medications were titrated to patient response per physician's verbal order, Versed 6 mg IV  DESCRIPTION OF PROCEDURE:   After the risks benefits and alternatives of the procedure were thoroughly explained, informed consent was obtained.  Digital rectal exam was performed and revealed no rectal masses.   The LB [id number] endoscope was introduced through the anus and advanced to the cecum, which was identified by both the appendix and ileocecal valve, without limitations.  The quality of the prep was good..  The instrument was then slowly withdrawn as the colon was fully examined. <<PROCEDUREIMAGES>> FINDINGS:  A normal appearing cecum, ileocecal valve, and appendiceal orifice were identified. The ascending, hepatic flexure, transverse, splenic flexure, descending, sigmoid colon, and rectum appeared unremarkable (see image1, image2, and image3). External Hemorrhoids were found.   Retroflexed views in the rectum revealed no abnormalities. COMPLICATIONS:  None  ENDOSCOPIC IMPRESSION: 1) Normal colon; no polyps or cancers 2) External hemorrhoids  RECOMMENDATIONS: 1) You should continue to follow colorectal cancer screening guidelines for "routine risk" patients with a repeat colonoscopy in 10 years. There is no need for FOBT (stool) testing for at least 5 years.  REPEAT EXAM:  10 years  ______________________________ Rachael Fee, MD  n. eSIGNED:   Rachael Fee at 07/25/2011 11:17 AM  Trena Platt, [phone]

## 2011-07-25 NOTE — Progress Notes (Signed)
Patient did not experience any of the following events: a burn prior to discharge; a fall within the facility; wrong site/side/patient/procedure/implant event; or a hospital transfer or hospital admission upon discharge from the facility. (G8907) Patient did not have preoperative order for IV antibiotic SSI prophylaxis. (G8918)  

## 2011-07-25 NOTE — Patient Instructions (Signed)
Discharge instructions given with verbal understanding. Handouts on hemorrhoids and a high fiber diet given. Resume previous medications. YOU HAD AN ENDOSCOPIC PROCEDURE TODAY AT THE [facility]: Refer to the procedure report that was given to you for any specific questions about what was found during the examination.  If the procedure report does not answer your questions, please call your gastroenterologist to clarify.  If you requested that your care partner not be given the details of your procedure findings, then the procedure report has been included in a sealed envelope for you to review at your convenience later.  YOU SHOULD EXPECT: Some feelings of bloating in the abdomen. Passage of more gas than usual.  Walking can help get rid of the air that was put into your GI tract during the procedure and reduce the bloating. If you had a lower endoscopy (such as a colonoscopy or flexible sigmoidoscopy) you may notice spotting of blood in your stool or on the toilet paper. If you underwent a bowel prep for your procedure, then you may not have a normal bowel movement for a few days.  DIET: Your first meal following the procedure should be a light meal and then it is ok to progress to your normal diet.  A half-sandwich or bowl of soup is an example of a good first meal.  Heavy or fried foods are harder to digest and may make you feel nauseous or bloated.  Likewise meals heavy in dairy and vegetables can cause extra gas to form and this can also increase the bloating.  Drink plenty of fluids but you should avoid alcoholic beverages for 24 hours.  ACTIVITY: Your care partner should take you home directly after the procedure.  You should plan to take it easy, moving slowly for the rest of the day.  You can resume normal activity the day after the procedure however you should NOT DRIVE or use heavy machinery for 24 hours (because of the sedation medicines used during the test).    SYMPTOMS TO  REPORT IMMEDIATELY: A gastroenterologist can be reached at any hour.  During normal business hours, 8:30 AM to 5:00 PM Monday through Friday, call [phone].  After hours and on weekends, please call the GI answering service at [phone] who will take a message and have the physician on call contact you.   Following lower endoscopy (colonoscopy or flexible sigmoidoscopy):  Excessive amounts of blood in the stool  Significant tenderness or worsening of abdominal pains  Swelling of the abdomen that is new, acute  Fever of 100F or higher FOLLOW UP: If any biopsies were taken you will be contacted by phone or by letter within the next 1-3 weeks.  Call your gastroenterologist if you have not heard about the biopsies in 3 weeks.  Our staff will call the home number listed on your records the next business day following your procedure to check on you and address any questions or concerns that you may have at that time regarding the information given to you following your procedure. This is a courtesy call and so if there is no answer at the home number and we have not heard from you through the emergency physician on call, we will assume that you have returned to your regular daily activities without incident.  SIGNATURES/CONFIDENTIALITY: You and/or your care partner have signed paperwork which will be entered into your electronic medical record.  These signatures attest to the fact that that the information above on your   After Visit Summary has been reviewed and is understood.  Full responsibility of the confidentiality of this discharge information lies with you and/or your care-partner.  

## 2011-07-26 ENCOUNTER — Telehealth: Payer: Self-pay | Admitting: *Deleted

## 2011-07-26 NOTE — Telephone Encounter (Signed)
No answer left message to call office if questions or concerns. 

## 2011-08-19 ENCOUNTER — Encounter: Payer: Self-pay | Admitting: Family Medicine

## 2011-08-19 ENCOUNTER — Ambulatory Visit (INDEPENDENT_AMBULATORY_CARE_PROVIDER_SITE_OTHER): Payer: No Typology Code available for payment source | Admitting: Family Medicine

## 2011-08-19 VITALS — BP 132/80 | HR 76 | Temp 97.6°F | Wt 206.0 lb

## 2011-08-19 DIAGNOSIS — R350 Frequency of micturition: Secondary | ICD-10-CM

## 2011-08-19 LAB — POCT URINALYSIS DIPSTICK
Bilirubin, UA: NEGATIVE
Nitrite, UA: NEGATIVE
pH, UA: 6.5

## 2011-08-19 MED ORDER — CIPROFLOXACIN HCL 500 MG PO TABS: 500.0000 mg | ORAL_TABLET | Freq: Two times a day (BID) | ORAL | Status: DC

## 2011-08-19 NOTE — Patient Instructions (Signed)
It was nice to see you. You likely have a urinary tract infection. Keep drinking plenty of fluids. Take Cipro as directed- 500 mg twice daily x 3 days. We are sending your urine for culture to see if bacteria grows- we will call you with those results next week.

## 2011-08-19 NOTE — Progress Notes (Signed)
SUBJECTIVE: Victoria [name] is a 51 y.o. female who complains of urinary frequency, urgency and dysuria x 7 days, without flank pain, fever, chills, or abnormal vaginal discharge or bleeding.   Patient Active Problem List  Diagnoses  . LIPOMA  . FIBROIDS, UTERUS  . OBESITY  . OTHER SPECIFIED ANEMIAS  . ALLERGIC RHINITIS  . UTERINE ENLARGEMENT  . MENORRHAGIA  . ECCHYMOSES, SPONTANEOUS  . Routine general medical examination at a health care facility  . Gynecological examination  . Special screening for malignant neoplasms, colon  . Hyperlipidemia   Past Medical History  Diagnosis Date  . Allergic rhinitis   . Anemia, iron deficiency   . Heavy menses   . Uterine fibroid   . Lipoma     on L arm   Past Surgical History  Procedure Date  . Cesarean section   . Tubal ligation   . Tonsillectomy   . Uterine ultrasound 06/1998    and endo biopsy- neg  . Pelvic u/s and transvaginal 04/27/2009    fibroids x 2  . Endometrial ablation     fibroids   History  Substance Use Topics  . Smoking status: Never Smoker   . Smokeless tobacco: Never Used  . Alcohol Use: No   Family History  Problem Relation Age of Onset  . Diabetes Mother   . Hypertension Mother   . Hyperlipidemia Mother   . Cancer Mother     uterine cancer  . Diabetes Father   . Alcohol abuse Father   . Hypertension Sister   . Diabetes Sister     boarderline  . Alcohol abuse Brother   . Hypertension Brother   . Diabetes Maternal Grandmother   . Alcohol abuse Paternal Grandfather   . Alcohol abuse Brother   . Alcohol abuse Brother   . Alcohol abuse Brother   . Alcohol abuse Sister   . Diabetes Sister     severe  . Colon cancer Neg Hx    Allergies  Allergen Reactions  . Codeine     REACTION: nausea and vomiting  . Doxycycline Nausea Only   Current Outpatient Prescriptions on File Prior to Visit  Medication Sig Dispense Refill  . ibuprofen (ADVIL,MOTRIN) 200 MG tablet Take 200 mg by mouth as needed.         . Multiple Vitamin (MULTIVITAMIN) tablet Take 1 tablet by mouth daily.       The PMH, PSH, Social History, Family History, Medications, and allergies have been reviewed in [facility], and have been updated if relevant.  OBJECTIVE:  BP 132/80  Pulse 76  Temp(Src) 97.6 F (36.4 C) (Oral)  Wt 206 lb (93.441 kg)  Appears well, in no apparent distress.  Vital signs are normal. The abdomen is soft without tenderness, guarding, mass, rebound or organomegaly. No CVA tenderness or inguinal adenopathy noted. Urine dipstick shows positive for RBC's.    ASSESSMENT: UTI uncomplicated without evidence of pyelonephritis- UA pos for blood only but symptoms classic for UTI.    PLAN: Treatment empirically with cipro 500 mg twice daily x 3 days, send urine for cx - also push fluids, may use Pyridium OTC prn. Call or return to clinic prn if these symptoms worsen or fail to improve as anticipated.

## 2011-08-22 ENCOUNTER — Other Ambulatory Visit: Payer: Self-pay | Admitting: Family Medicine

## 2011-08-22 LAB — URINE CULTURE: Colony Count: 30000

## 2011-08-22 MED ORDER — CIPROFLOXACIN HCL 500 MG PO TABS: 500.0000 mg | ORAL_TABLET | Freq: Two times a day (BID) | ORAL | Status: AC

## 2011-10-18 ENCOUNTER — Ambulatory Visit (INDEPENDENT_AMBULATORY_CARE_PROVIDER_SITE_OTHER): Payer: No Typology Code available for payment source | Admitting: Family Medicine

## 2011-10-18 ENCOUNTER — Encounter: Payer: Self-pay | Admitting: Family Medicine

## 2011-10-18 VITALS — BP 110/68 | HR 64 | Temp 98.0°F | Ht 63.0 in | Wt 197.2 lb

## 2011-10-18 DIAGNOSIS — R1011 Right upper quadrant pain: Secondary | ICD-10-CM

## 2011-10-18 NOTE — Progress Notes (Signed)
Subjective:    Patient ID: Victoria [name], female    DOB: 04-01-1961, 51 y.o.   MRN: [id number]  HPI Has had 2-3 weeks of worsening stomach pain and nausea Originally after she ate  Now is more all the time   Hurts in epigastric area and to the right  Still has a gallbladder  Some ? Rad to shoulder   No vomiting - just nausea Some loose stool -no color change   Feels hot at times / no fever   Tried some maalox - helps some  Zantac no help at all  prilosec - helps some but not for a while    Takes ibuprofen sometimes   Has never been lactose intolerant   Patient Active Problem List  Diagnosis  . LIPOMA  . FIBROIDS, UTERUS  . OBESITY  . OTHER SPECIFIED ANEMIAS  . ALLERGIC RHINITIS  . UTERINE ENLARGEMENT  . MENORRHAGIA  . ECCHYMOSES, SPONTANEOUS  . Routine general medical examination at a health care facility  . Gynecological examination  . Special screening for malignant neoplasms, colon  . Hyperlipidemia  . Abdominal pain, right upper quadrant  . Gallstones   Past Medical History  Diagnosis Date  . Allergic rhinitis   . Anemia, iron deficiency   . Heavy menses   . Uterine fibroid   . Lipoma     on L arm   Past Surgical History  Procedure Date  . Cesarean section   . Tubal ligation   . Tonsillectomy   . Uterine ultrasound 06/1998    and endo biopsy- neg  . Pelvic u/s and transvaginal 04/27/2009    fibroids x 2  . Endometrial ablation     fibroids   History  Substance Use Topics  . Smoking status: Never Smoker   . Smokeless tobacco: Never Used  . Alcohol Use: No   Family History  Problem Relation Age of Onset  . Diabetes Mother   . Hypertension Mother   . Hyperlipidemia Mother   . Cancer Mother     uterine cancer  . Diabetes Father   . Alcohol abuse Father   . Hypertension Sister   . Diabetes Sister     boarderline  . Alcohol abuse Brother   . Hypertension Brother   . Diabetes Maternal Grandmother   . Alcohol abuse Paternal  Grandfather   . Alcohol abuse Brother   . Alcohol abuse Brother   . Alcohol abuse Brother   . Alcohol abuse Sister   . Diabetes Sister     severe  . Colon cancer Neg Hx    Allergies  Allergen Reactions  . Codeine     REACTION: nausea and vomiting  . Doxycycline Nausea Only   Current Outpatient Prescriptions on File Prior to Visit  Medication Sig Dispense Refill  . ibuprofen (ADVIL,MOTRIN) 200 MG tablet Take 200 mg by mouth as needed.        . Multiple Vitamin (MULTIVITAMIN) tablet Take 1 tablet by mouth daily.          Review of Systems Review of Systems  Constitutional: Negative for fever, appetite change, fatigue and unexpected weight change.  Eyes: Negative for pain and visual disturbance.  Respiratory: Negative for cough and shortness of breath.   Cardiovascular: Negative for cp or palpitations    Gastrointestinal: Negative for constipation/ dark stool/ blood in stool .  Genitourinary: Negative for urgency and frequency.  Skin: Negative for pallor or rash  neg for jaundice  Neurological: Negative for weakness,  light-headedness, numbness and headaches.  Hematological: Negative for adenopathy. Does not bruise/bleed easily.  Psychiatric/Behavioral: Negative for dysphoric mood. The patient is not nervous/anxious.         Objective:   Physical Exam  Constitutional: She appears well-developed and well-nourished. No distress.  HENT:  Head: Normocephalic and atraumatic.  Mouth/Throat: Oropharynx is clear and moist.  Eyes: Conjunctivae and EOM are normal. Pupils are equal, round, and reactive to light. No scleral icterus.  Neck: Normal range of motion. Neck supple. No thyromegaly present.  Cardiovascular: Normal rate, regular rhythm and normal heart sounds.  Exam reveals no gallop.   Pulmonary/Chest: Effort normal and breath sounds normal. No respiratory distress. She has no wheezes.  Abdominal: Soft. Bowel sounds are normal. She exhibits no distension and no mass. There is  tenderness in the right upper quadrant and epigastric area. There is no rebound and no guarding.  Musculoskeletal: Normal range of motion.  Lymphadenopathy:    She has no cervical adenopathy.  Neurological: She is alert. She has normal reflexes. No cranial nerve deficit. She exhibits normal muscle tone. Coordination normal.  Skin: Skin is warm and dry. No rash noted. No erythema. No pallor.       No jaundice   Psychiatric: She has a normal mood and affect.          Assessment & Plan:

## 2011-10-18 NOTE — Assessment & Plan Note (Signed)
Worse with eating - both epigastric and RUQ Pos murphy sign  Suspect gallbladder etiology vs gastritis Told to stop nsaids Bland low fat diet prilosec otc daily sched abd [name] Update if worse or other symptoms

## 2011-10-18 NOTE — Patient Instructions (Addendum)
We will refer you for abdominal ultrasound at check out  Eat bland diet- low fat/ small portions - make sure to drink fluids Take otc prilosec one pill daily in the am  Will update you with a result

## 2011-10-19 ENCOUNTER — Telehealth: Payer: Self-pay | Admitting: Family Medicine

## 2011-10-19 ENCOUNTER — Ambulatory Visit
Admission: RE | Admit: 2011-10-19 | Discharge: 2011-10-19 | Disposition: A | Discharge status: Home / Self Care | Payer: No Typology Code available for payment source | Source: Ambulatory Visit | Attending: Family Medicine | Admitting: Family Medicine

## 2011-10-19 DIAGNOSIS — R1011 Right upper quadrant pain: Secondary | ICD-10-CM

## 2011-10-19 DIAGNOSIS — K8020 Calculus of gallbladder without cholecystitis without obstruction: Secondary | ICD-10-CM | POA: Insufficient documentation

## 2011-10-19 NOTE — Telephone Encounter (Signed)
Message copied by Judy Pimple on Wed Oct 19, 2011  2:10 PM ------      Message from: LAWS, REGINA C      Created: Wed Oct 19, 2011 11:40 AM       Patient notified as instructed by telephone. Patient stated that she would like to see a Careers adviser in [place]. Advised patient that Shirlee Limerick will be in touch with her to get this scheduled.

## 2011-10-25 ENCOUNTER — Telehealth: Payer: Self-pay

## 2011-10-25 DIAGNOSIS — R1011 Right upper quadrant pain: Secondary | ICD-10-CM

## 2011-10-25 DIAGNOSIS — K8020 Calculus of gallbladder without cholecystitis without obstruction: Secondary | ICD-10-CM

## 2011-10-25 NOTE — Telephone Encounter (Signed)
Pt left v/m requesting surgical referral to surgeon in [place]. Pt cannot see surgeon in [place] for 3 weeks and pt in pain.Please advise.

## 2011-10-25 NOTE — Telephone Encounter (Signed)
Lets do some labs on her -if abn liver function or other labs they may see her sooner Please sched lab appt I ordered labs

## 2011-10-26 NOTE — Telephone Encounter (Signed)
Please forward to correct office employee, as I am unaware of when I will return to your office. Thanks/SLS

## 2011-10-26 NOTE — Telephone Encounter (Signed)
Patient notified as instructed by telephone. Lab appointment scheduled. 

## 2011-10-26 NOTE — Telephone Encounter (Signed)
Left message on machine to call back  

## 2011-10-27 ENCOUNTER — Other Ambulatory Visit (INDEPENDENT_AMBULATORY_CARE_PROVIDER_SITE_OTHER): Payer: No Typology Code available for payment source

## 2011-10-27 DIAGNOSIS — R1011 Right upper quadrant pain: Secondary | ICD-10-CM

## 2011-10-27 DIAGNOSIS — K8020 Calculus of gallbladder without cholecystitis without obstruction: Secondary | ICD-10-CM

## 2011-10-27 LAB — HEPATIC FUNCTION PANEL
Alkaline Phosphatase: 60 U/L (ref 39–117)
Bilirubin, Direct: 0.1 mg/dL (ref 0.0–0.3)
Total Bilirubin: 0.6 mg/dL (ref 0.3–1.2)

## 2011-10-27 LAB — CBC WITH DIFFERENTIAL/PLATELET
Basophils Relative: 0.8 % (ref 0.0–3.0)
Eosinophils Absolute: 0.1 10*3/uL (ref 0.0–0.7)
Lymphocytes Relative: 35 % (ref 12.0–46.0)
MCHC: 33 g/dL (ref 30.0–36.0)
MCV: 85 fl (ref 78.0–100.0)
Monocytes Absolute: 0.4 10*3/uL (ref 0.1–1.0)
Neutrophils Relative %: 57 % (ref 43.0–77.0)
Platelets: 249 10*3/uL (ref 150.0–400.0)
RBC: 4.72 Mil/uL (ref 3.87–5.11)
WBC: 6.1 10*3/uL (ref 4.5–10.5)

## 2011-10-27 LAB — BASIC METABOLIC PANEL
BUN: 6 mg/dL (ref 6–23)
CO2: 26 mEq/L (ref 19–32)
Chloride: 107 mEq/L (ref 96–112)
Creatinine, Ser: 0.7 mg/dL (ref 0.4–1.2)
Glucose, Bld: 92 mg/dL (ref 70–99)
Potassium: 3.9 mEq/L (ref 3.5–5.1)

## 2011-10-27 LAB — LIPASE: Lipase: 30 U/L (ref 11.0–59.0)

## 2011-10-27 LAB — AMYLASE: Amylase: 62 U/L (ref 27–131)

## 2011-11-07 ENCOUNTER — Ambulatory Visit: Payer: Self-pay | Admitting: General Surgery

## 2011-11-07 HISTORY — PX: CHOLECYSTECTOMY: SHX55

## 2011-11-08 ENCOUNTER — Ambulatory Visit (INDEPENDENT_AMBULATORY_CARE_PROVIDER_SITE_OTHER): Payer: No Typology Code available for payment source | Admitting: Surgery

## 2011-11-19 ENCOUNTER — Observation Stay (HOSPITAL_COMMUNITY)
Admission: EM | Admit: 2011-11-19 | Discharge: 2011-11-20 | Disposition: A | Discharge status: Home / Self Care | Payer: No Typology Code available for payment source | Attending: Internal Medicine | Admitting: Internal Medicine

## 2011-11-19 ENCOUNTER — Encounter (HOSPITAL_COMMUNITY): Payer: Self-pay | Admitting: *Deleted

## 2011-11-19 ENCOUNTER — Emergency Department (INDEPENDENT_AMBULATORY_CARE_PROVIDER_SITE_OTHER)
Admission: EM | Admit: 2011-11-19 | Discharge: 2011-11-19 | Disposition: A | Discharge status: Nursing Home (Includes ICF) | Payer: No Typology Code available for payment source | Source: Home / Self Care | Attending: Emergency Medicine | Admitting: Emergency Medicine

## 2011-11-19 DIAGNOSIS — R079 Chest pain, unspecified: Secondary | ICD-10-CM

## 2011-11-19 DIAGNOSIS — Z9889 Other specified postprocedural states: Secondary | ICD-10-CM | POA: Insufficient documentation

## 2011-11-19 DIAGNOSIS — R1011 Right upper quadrant pain: Secondary | ICD-10-CM | POA: Diagnosis present

## 2011-11-19 DIAGNOSIS — R0789 Other chest pain: Principal | ICD-10-CM | POA: Insufficient documentation

## 2011-11-19 DIAGNOSIS — R0602 Shortness of breath: Secondary | ICD-10-CM | POA: Insufficient documentation

## 2011-11-19 MED ORDER — SODIUM CHLORIDE 0.9 % IV SOLN
INTRAVENOUS | Status: DC
  Administered 2011-11-20 (×2): via INTRAVENOUS

## 2011-11-19 MED ORDER — ACETAMINOPHEN 325 MG PO TABS
650.0000 mg | ORAL_TABLET | Freq: Once | ORAL | Status: AC
  Administered 2011-11-19: 650 mg via ORAL
  Filled 2011-11-19: qty 2

## 2011-11-19 NOTE — ED Provider Notes (Signed)
History     CSN: [id number]  Arrival date & time 11/19/11  2035   First MD Initiated Contact with Patient 11/19/11 2307      Chief Complaint  Patient presents with  . Chest Pain    (Consider location/radiation/quality/duration/timing/severity/associated sxs/prior treatment) HPI History provided by patient. Had gallbladder removed about 2 weeks ago. Has been having epigastric pain and told by her surgeon that she did have a lot of stones and may have a retained stone. She is presenting for evaluation and developed left-sided chest pain that is sharp in quality and does not seem to be related to her epigastric pain. No associated shortness of breath. No diaphoresis. No nausea or vomiting. She went to the urgent care and had an EKG performed and now presents here for further evaluation. In her chest as not radiating. It is not affected by movement or breathing. No leg pain or leg swelling. No history of DVT or PE. He feels like she's been having fever and chills for the last week but no recorded temperature. No family history of PE or blood clots. No history of diabetes, hypertension or hyperlipidemia. Father died in his 49s of heart related issues but was previously not diagnosed with coronary artery disease. Moderate severity. Aspirin given and patient declines any pain medications for her chest pain. Past Medical History  Diagnosis Date  . Allergic rhinitis   . Anemia, iron deficiency   . Heavy menses   . Uterine fibroid   . Lipoma     on L arm    Past Surgical History  Procedure Date  . Cesarean section   . Tubal ligation   . Tonsillectomy   . Uterine ultrasound 06/1998    and endo biopsy- neg  . Pelvic u/s and transvaginal 04/27/2009    fibroids x 2  . Endometrial ablation     fibroids  . Cholecystectomy     11/07/11    Family History  Problem Relation Age of Onset  . Diabetes Mother   . Hypertension Mother   . Hyperlipidemia Mother   . Cancer Mother     uterine cancer   . Diabetes Father   . Alcohol abuse Father   . Hypertension Sister   . Diabetes Sister     boarderline  . Alcohol abuse Brother   . Hypertension Brother   . Diabetes Maternal Grandmother   . Alcohol abuse Paternal Grandfather   . Alcohol abuse Brother   . Alcohol abuse Brother   . Alcohol abuse Brother   . Alcohol abuse Sister   . Diabetes Sister     severe  . Colon cancer Neg Hx     History  Substance Use Topics  . Smoking status: Never Smoker   . Smokeless tobacco: Never Used  . Alcohol Use: No    OB History    Grav Para Term Preterm Abortions TAB SAB Ect Mult Living                  Review of Systems  Constitutional: Negative for fever and chills.  HENT: Negative for neck pain and neck stiffness.   Eyes: Negative for pain.  Respiratory: Negative for shortness of breath.   Cardiovascular: Positive for chest pain.  Gastrointestinal: Positive for abdominal pain.  Genitourinary: Negative for dysuria.  Musculoskeletal: Negative for back pain.  Skin: Negative for rash.  Neurological: Negative for headaches.  All other systems reviewed and are negative.    Allergies  Codeine and Doxycycline  Home Medications  No current outpatient prescriptions on file.  BP 116/64  Pulse 78  Temp 98.6 F (37 C) (Oral)  Resp 16  SpO2 98%  Physical Exam  Constitutional: She is oriented to person, place, and time. She appears well-developed and well-nourished.  HENT:  Head: Normocephalic and atraumatic.  Eyes: Conjunctivae and EOM are normal. Pupils are equal, round, and reactive to light.  Neck: Trachea normal. Neck supple. No thyromegaly present.  Cardiovascular: Normal rate, regular rhythm, S1 normal, S2 normal and normal pulses.     No systolic murmur is present   No diastolic murmur is present  Pulses:      Radial pulses are 2+ on the right side, and 2+ on the left side.  Pulmonary/Chest: Effort normal and breath sounds normal. She has no wheezes. She has no  rhonchi. She has no rales. She exhibits no tenderness.  Abdominal: Soft. Normal appearance and bowel sounds are normal. There is no CVA tenderness and negative Murphy's sign.       Tender over epigastric region. Laparoscopy scars appear to be well healing and nontender without drainage or discharge  Musculoskeletal:       BLE:s Calves nontender, no cords or erythema, negative Homans sign  Neurological: She is alert and oriented to person, place, and time. She has normal strength. No cranial nerve deficit or sensory deficit. GCS eye subscore is 4. GCS verbal subscore is 5. GCS motor subscore is 6.  Skin: Skin is warm and dry. No rash noted. She is not diaphoretic.  Psychiatric: Her speech is normal.       Cooperative and appropriate    ED Course  Procedures (including critical care time)  Results for orders placed during the hospital encounter of 11/19/11  CBC      Component Value Range   WBC 9.4  4.0 - 10.5 K/uL   RBC 4.60  3.87 - 5.11 MIL/uL   Hemoglobin 13.0  12.0 - 15.0 g/dL   HCT 56.2  13.0 - 86.5 %   MCV 81.5  78.0 - 100.0 fL   MCH 28.3  26.0 - 34.0 pg   MCHC 34.7  30.0 - 36.0 g/dL   RDW 78.4  69.6 - 29.5 %   Platelets 287  150 - 400 K/uL  COMPREHENSIVE METABOLIC PANEL      Component Value Range   Sodium 137  135 - 145 mEq/L   Potassium 3.3 (*) 3.5 - 5.1 mEq/L   Chloride 100  96 - 112 mEq/L   CO2 26  19 - 32 mEq/L   Glucose, Bld 113 (*) 70 - 99 mg/dL   BUN 7  6 - 23 mg/dL   Creatinine, Ser 2.84  0.50 - 1.10 mg/dL   Calcium 9.5  8.4 - 13.2 mg/dL   Total Protein 7.9  6.0 - 8.3 g/dL   Albumin 4.0  3.5 - 5.2 g/dL   AST 19  0 - 37 U/L   ALT 21  0 - 35 U/L   Alkaline Phosphatase 81  39 - 117 U/L   Total Bilirubin 0.2 (*) 0.3 - 1.2 mg/dL   GFR calc non Af Amer >90  >90 mL/min   GFR calc Af Amer >90  >90 mL/min  LIPASE, BLOOD      Component Value Range   Lipase 44  11 - 59 U/L  URINALYSIS, ROUTINE W REFLEX MICROSCOPIC      Component Value Range   Color, Urine YELLOW   YELLOW  APPearance CLOUDY (*) CLEAR   Specific Gravity, Urine 1.019  1.005 - 1.030   pH 6.5  5.0 - 8.0   Glucose, UA NEGATIVE  NEGATIVE mg/dL   Hgb urine dipstick SMALL (*) NEGATIVE   Bilirubin Urine NEGATIVE  NEGATIVE   Ketones, ur NEGATIVE  NEGATIVE mg/dL   Protein, ur NEGATIVE  NEGATIVE mg/dL   Urobilinogen, UA 0.2  0.0 - 1.0 mg/dL   Nitrite NEGATIVE  NEGATIVE   Leukocytes, UA LARGE (*) NEGATIVE  POCT I-STAT TROPONIN I      Component Value Range   Troponin i, poc 0.00  0.00 - 0.08 ng/mL   Comment 3           URINE MICROSCOPIC-ADD ON      Component Value Range   Squamous Epithelial / LPF FEW (*) RARE   WBC, UA 11-20  <3 WBC/hpf   RBC / HPF 3-6  <3 RBC/hpf   Bacteria, UA FEW (*) RARE   Urine-Other MUCOUS PRESENT     Ct Angio Chest W/cm &/or Wo Cm  11/20/2011  *RADIOLOGY REPORT*  Clinical Data:  Short of breath, abdominal pain.  Concern pulmonary embolism.  Left chest pain.  CT ANGIOGRAPHY CHEST CT ABDOMEN AND PELVIS WITH CONTRAST  Technique:  Multidetector CT imaging of the chest was performed using the standard protocol during bolus administration of intravenous contrast.  Multiplanar CT image reconstructions including MIPs were obtained to evaluate the vascular anatomy. Multidetector CT imaging of the abdomen and pelvis was performed using the standard protocol during bolus administration of intravenous contrast.  Contrast: OMNIPAQUE IOHEXOL 350 MG/ML SOLN,  Comparison:   None.  CTA CHEST  Findings:  There are  no filling defects of the pulmonary suggest acute pulmonary embolism.  No acute findings of the aorta or great vessels.  No pericardial fluid.  Esophagus is normal.  No mediastinal or hilar lymphadenopathy.  Review of the lung parenchyma demonstrates no pleural fluid or pulmonary edema.  No pneumothorax.  There is no chest wall abnormality.   Review of the MIP images confirms the above findings.  IMPRESSION: No evidence of pulmonary embolism.   No chest wall abnormality.  CT  ABDOMEN AND PELVIS  Findings: Small low density lesion in the left hepatic lobe measures 8 mm has simple fluid attenuation consistent with a hepatic cyst.  Prior cholecystectomy.  The pancreas, spleen, adrenal glands, and kidneys are normal.  The stomach, small bowel, and cecum are normal.  Colon and rectosigmoid colon are normal.  Abdominal aorta normal caliber.  No retroperitoneal or periportal lymphadenopathy.  No peritoneal disease.  No free fluid the pelvis.  Bladder and ovaries are normal. Review of  bone windows demonstrates no aggressive osseous lesions.  Review of the MIP images confirms the above findings.  IMPRESSION:  1.  No acute abdominal or pelvic findings. 2.  Cholecystectomy.  Original Report Authenticated By: Genevive Bi, M.D.   Ct Abdomen Pelvis W Contrast  11/20/2011  *RADIOLOGY REPORT*  Clinical Data:  Short of breath, abdominal pain.  Concern pulmonary embolism.  Left chest pain.  CT ANGIOGRAPHY CHEST CT ABDOMEN AND PELVIS WITH CONTRAST  Technique:  Multidetector CT imaging of the chest was performed using the standard protocol during bolus administration of intravenous contrast.  Multiplanar CT image reconstructions including MIPs were obtained to evaluate the vascular anatomy. Multidetector CT imaging of the abdomen and pelvis was performed using the standard protocol during bolus administration of intravenous contrast.  Contrast: OMNIPAQUE IOHEXOL 350 MG/ML  SOLN,  Comparison:   None.  CTA CHEST  Findings:  There are  no filling defects of the pulmonary suggest acute pulmonary embolism.  No acute findings of the aorta or great vessels.  No pericardial fluid.  Esophagus is normal.  No mediastinal or hilar lymphadenopathy.  Review of the lung parenchyma demonstrates no pleural fluid or pulmonary edema.  No pneumothorax.  There is no chest wall abnormality.   Review of the MIP images confirms the above findings.  IMPRESSION: No evidence of pulmonary embolism.   No chest wall  abnormality.  CT ABDOMEN AND PELVIS  Findings: Small low density lesion in the left hepatic lobe measures 8 mm has simple fluid attenuation consistent with a hepatic cyst.  Prior cholecystectomy.  The pancreas, spleen, adrenal glands, and kidneys are normal.  The stomach, small bowel, and cecum are normal.  Colon and rectosigmoid colon are normal.  Abdominal aorta normal caliber.  No retroperitoneal or periportal lymphadenopathy.  No peritoneal disease.  No free fluid the pelvis.  Bladder and ovaries are normal. Review of  bone windows demonstrates no aggressive osseous lesions.  Review of the MIP images confirms the above findings.  IMPRESSION:  1.  No acute abdominal or pelvic findings. 2.  Cholecystectomy.  Original Report Authenticated By: Genevive Bi, M.D.     Date: 11/20/2011  Rate: 78  Rhythm: normal sinus rhythm  QRS Axis: normal  Intervals: normal  ST/T Wave abnormalities: nonspecific ST/T changes  Conduction Disutrbances:none  Narrative Interpretation:   Old EKG Reviewed: none available  Patient declines pain medications. Aspirin provided.  3:49 AM d/w triad hospitalist, will admit.   MDM   Chest pain status post gallbladder surgery 2 weeks ago. Evaluated with CT angiogram which was negative for PE. Also evaluated with CT abdomen which does not show any abscess or concerning postsurgical changes. Plan medical admission for further evaluation of chest pain.        Sunnie Nielsen, MD 11/20/11 [phone]

## 2011-11-19 NOTE — ED Notes (Addendum)
Reports left chest pains, worse with deep breathing and movement, radiating through to back since last night.  At times, has had episodes of sweating, nausea.  Denies any cough, congestion, fevers, but did have a rattling cough the week she got out of surgery which has since resolved.  "Feels like there's something choking me under the ribcage."  Pt had cholecystectomy 11/07/11 - spouse was told by surgeon that she "had a lot of stones, and that he may have missed one in the duct that could continue to cause problems".  Skin warm, dry.  Has taken Zantac for sxs without any change or relief.

## 2011-11-19 NOTE — ED Notes (Signed)
Pt sent from [facility]. Pt having CP. Pt states she had her gallbladder removed last Monday. Pt states she is having right sided CP under her right breast and last night she has a had sharp pain in the center of her chest. Pt denies SOB, N/V

## 2011-11-19 NOTE — ED Provider Notes (Signed)
History     CSN: [id number]  Arrival date & time 11/19/11  1901   First MD Initiated Contact with Patient 11/19/11 1914      Chief Complaint  Patient presents with  . Chest Pain    (Consider location/radiation/quality/duration/timing/severity/associated sxs/prior treatment) HPI Comments: Patient presents with 2 weeks of chest pain that started after having her gallbladder removed. She describes it as substernal pressure, which becomes worse with exertion, and better with rest and lying down. She states that it radiates through to her back. No radiation up her neck or down her arm. States it is becoming more intense and frequent, and yesterday she reported some diaphoresis and nausea when the pain was particularly severe. It is not affected with eating. She has been taking Zantac on a regular basis without improvement. She stopped all NSAIDs over a month ago. She reports sharp, pleuritic chest pain when taking a deep breath as well, and some sharp, shoulder pain, which is present with movement and use of her arm. No palpitations, presyncope, or syncope No abdominal pain, dyspepsia, increased belching, changes in bowel habit. Family history significant for father with MI at age 57.   ROS as noted in HPI. All other ROS negative.   Patient is a 51 y.o. female presenting with chest pain. No language interpreter was used.  Chest Pain The chest pain began 1 - 2 weeks ago. Chest pain occurs intermittently. The chest pain is worsening. The pain is associated with exertion and breathing. The quality of the pain is described as pressure-like and sharp. The pain radiates to the upper back and left shoulder. Chest pain is worsened by exertion and deep breathing. Primary symptoms include shortness of breath and nausea. Pertinent negatives for primary symptoms include no fever, no syncope, no cough, no wheezing, no palpitations, no abdominal pain and no vomiting.  Pertinent negatives for associated symptoms  include no near-syncope. She tried antacids and histamine-2 blockers for the symptoms. Risk factors include being elderly, obesity and post-menopausal.  Pertinent negatives for past medical history include no CAD, no diabetes, no DVT, no hyperlipidemia, no hypertension, no MI and no PE.  Her family medical history is significant for early MI in family.     Past Medical History  Diagnosis Date  . Allergic rhinitis   . Anemia, iron deficiency   . Heavy menses   . Uterine fibroid   . Lipoma     on L arm    Past Surgical History  Procedure Date  . Cesarean section   . Tubal ligation   . Tonsillectomy   . Uterine ultrasound 06/1998    and endo biopsy- neg  . Pelvic u/s and transvaginal 04/27/2009    fibroids x 2  . Endometrial ablation     fibroids  . Cholecystectomy     11/07/11    Family History  Problem Relation Age of Onset  . Diabetes Mother   . Hypertension Mother   . Hyperlipidemia Mother   . Cancer Mother     uterine cancer  . Diabetes Father   . Alcohol abuse Father   . Hypertension Sister   . Diabetes Sister     boarderline  . Alcohol abuse Brother   . Hypertension Brother   . Diabetes Maternal Grandmother   . Alcohol abuse Paternal Grandfather   . Alcohol abuse Brother   . Alcohol abuse Brother   . Alcohol abuse Brother   . Alcohol abuse Sister   . Diabetes Sister  severe  . Colon cancer Neg Hx     History  Substance Use Topics  . Smoking status: Never Smoker   . Smokeless tobacco: Never Used  . Alcohol Use: No    OB History    Grav Para Term Preterm Abortions TAB SAB Ect Mult Living                  Review of Systems  Constitutional: Negative for fever.  Respiratory: Positive for shortness of breath. Negative for cough and wheezing.   Cardiovascular: Positive for chest pain. Negative for palpitations, syncope and near-syncope.  Gastrointestinal: Positive for nausea. Negative for vomiting and abdominal pain.    Allergies  Codeine  and Doxycycline  Home Medications   Current Outpatient Rx  Name Route Sig Dispense Refill  . RANITIDINE HCL 300 MG PO TABS Oral Take 300 mg by mouth at bedtime.    . IBUPROFEN 200 MG PO TABS Oral Take 200 mg by mouth as needed.      Marland Kitchen ONE-DAILY MULTI VITAMINS PO TABS Oral Take 1 tablet by mouth daily.      BP 110/65  Pulse 90  Temp 99.2 F (37.3 C) (Oral)  Resp 16  SpO2 100%  Physical Exam  Nursing note and vitals reviewed. Constitutional: She is oriented to person, place, and time. She appears well-developed and well-nourished.  HENT:  Head: Normocephalic and atraumatic.  Eyes: Conjunctivae and EOM are normal.  Neck: Normal range of motion.  Cardiovascular: Normal rate, regular rhythm, normal heart sounds and intact distal pulses.   No murmur heard. Pulmonary/Chest: Effort normal and breath sounds normal. She exhibits no tenderness.  Abdominal: Soft. Bowel sounds are normal. She exhibits no distension. There is no tenderness. There is no rebound and no guarding.         Multiple healed laparoscopic scars, appropriately tender. No signs of infection  Musculoskeletal: Normal range of motion. She exhibits no edema and no tenderness.       Left shoulder: Normal. She exhibits no tenderness, no bony tenderness and normal pulse.  Neurological: She is alert and oriented to person, place, and time.  Skin: Skin is warm and dry.  Psychiatric: She has a normal mood and affect. Her behavior is normal. Judgment and thought content normal.    ED Course  Procedures (including critical care time)  Labs Reviewed - No data to display No results found.   1. Chest pain       MDM  EKG: Normal sinus rhythm, rate 86. Normal axis, normal intervals. No hypertrophy. Nonspecific T-wave changes. No previous EKG for comparison    She has several risk factors concerning for atypical chest pain. PE also in the differential as she did have recent surgery, but she is not tachycardic or hypoxic.  Her EKG has nonspecific changes, and there is no previous EKG for comparison. Vitals are acceptable, and patient currently asymptomatic. She is stable to go by shuttle. Transferring to the ED for her serial troponins and further workup.  Luiz Blare, MD 11/19/11 2104

## 2011-11-20 ENCOUNTER — Emergency Department (HOSPITAL_COMMUNITY): Payer: No Typology Code available for payment source

## 2011-11-20 DIAGNOSIS — R079 Chest pain, unspecified: Secondary | ICD-10-CM

## 2011-11-20 DIAGNOSIS — R1011 Right upper quadrant pain: Secondary | ICD-10-CM

## 2011-11-20 LAB — URINE MICROSCOPIC-ADD ON

## 2011-11-20 LAB — URINALYSIS, ROUTINE W REFLEX MICROSCOPIC
Glucose, UA: NEGATIVE mg/dL
Ketones, ur: NEGATIVE mg/dL
Protein, ur: NEGATIVE mg/dL
pH: 6.5 (ref 5.0–8.0)

## 2011-11-20 LAB — COMPREHENSIVE METABOLIC PANEL
ALT: 21 U/L (ref 0–35)
AST: 19 U/L (ref 0–37)
Albumin: 4 g/dL (ref 3.5–5.2)
CO2: 26 mEq/L (ref 19–32)
Chloride: 100 mEq/L (ref 96–112)
GFR calc non Af Amer: >90 mL/min (ref >90)
Sodium: 137 mEq/L (ref 135–145)
Total Bilirubin: 0.2 mg/dL — ABNORMAL LOW (ref 0.3–1.2)

## 2011-11-20 LAB — POCT I-STAT TROPONIN I: Troponin i, poc: 0 ng/mL (ref 0.00–0.08)

## 2011-11-20 LAB — CBC
Platelets: 287 10*3/uL (ref 150–400)
RBC: 4.6 MIL/uL (ref 3.87–5.11)
RDW: 12.4 % (ref 11.5–15.5)
WBC: 9.4 10*3/uL (ref 4.0–10.5)

## 2011-11-20 LAB — LIPID PANEL
HDL: 37 mg/dL — ABNORMAL LOW (ref >39)
LDL Cholesterol: 108 mg/dL — ABNORMAL HIGH (ref 0–99)
Total CHOL/HDL Ratio: 4.3 RATIO
VLDL: 14 mg/dL (ref 0–40)

## 2011-11-20 LAB — TROPONIN I: Troponin I: <0.3 ng/mL (ref <0.30)

## 2011-11-20 LAB — CARDIAC PANEL(CRET KIN+CKTOT+MB+TROPI)
Relative Index: 1.7 (ref 0.0–2.5)
Total CK: 103 U/L (ref 7–177)

## 2011-11-20 MED ORDER — ASPIRIN EC 81 MG PO TBEC: 81.0000 mg | DELAYED_RELEASE_TABLET | Freq: Every day | ORAL | Status: DC

## 2011-11-20 MED ORDER — ACETAMINOPHEN 650 MG RE SUPP: 650.0000 mg | Freq: Four times a day (QID) | RECTAL | Status: DC | PRN

## 2011-11-20 MED ORDER — ASPIRIN 81 MG PO TBEC: 81.0000 mg | DELAYED_RELEASE_TABLET | Freq: Every day | ORAL | Status: AC

## 2011-11-20 MED ORDER — ONDANSETRON HCL 4 MG PO TABS: 4.0000 mg | ORAL_TABLET | Freq: Four times a day (QID) | ORAL | Status: DC | PRN

## 2011-11-20 MED ORDER — IOHEXOL 300 MG/ML  SOLN
20.0000 mL | INTRAMUSCULAR | Status: AC
Start: 2011-11-20 — End: 2011-11-20
  Administered 2011-11-20 (×2): 20 mL via ORAL

## 2011-11-20 MED ORDER — ASPIRIN 81 MG PO CHEW
324.0000 mg | CHEWABLE_TABLET | Freq: Once | ORAL | Status: AC
  Administered 2011-11-20: 324 mg via ORAL
  Filled 2011-11-20: qty 4
  Filled 2011-11-20: qty 1

## 2011-11-20 MED ORDER — ACETAMINOPHEN 325 MG PO TABS: 650.0000 mg | ORAL_TABLET | Freq: Four times a day (QID) | ORAL | Status: DC | PRN

## 2011-11-20 MED ORDER — ALUM & MAG HYDROXIDE-SIMETH 200-200-20 MG/5ML PO SUSP: 30.0000 mL | Freq: Four times a day (QID) | ORAL | Status: DC | PRN

## 2011-11-20 MED ORDER — ONDANSETRON HCL 4 MG/2ML IJ SOLN: 4.0000 mg | Freq: Four times a day (QID) | INTRAMUSCULAR | Status: DC | PRN

## 2011-11-20 MED ORDER — IOHEXOL 350 MG/ML SOLN
100.0000 mL | Freq: Once | INTRAVENOUS | Status: AC | PRN
  Administered 2011-11-20: 100 mL via INTRAVENOUS

## 2011-11-20 MED ORDER — HEPARIN SODIUM (PORCINE) 5000 UNIT/ML IJ SOLN
5000.0000 [IU] | Freq: Three times a day (TID) | INTRAMUSCULAR | Status: DC
  Filled 2011-11-20 (×3): qty 1

## 2011-11-20 NOTE — ED Notes (Signed)
Admitting MD at bedside.

## 2011-11-20 NOTE — Discharge Summary (Signed)
Physician Discharge Summary  Victoria [name] ZOX:[id number] DOB: 1961/05/15 DOA: 11/19/2011  PCP: Roxy Manns, MD  Admit date: 11/19/2011 Discharge date: 11/20/2011  Recommendations for Outpatient Follow-up:  1. The patient will have a stress test arranged at [facility] cardiology office (include homehealth, outpatient follow-up instructions, specific recommendations for PCP to follow-up on, etc.)  Discharge Diagnoses:  Chest pain -atypical angina-probably musculoskeletal in nature  Abdominal pain, right upper quadrant - status post recent laparoscopic cholecystectomy  Discharge Condition: Good  Diet recommendation: Regular  History of present illness:  51 year old woman admitted with chest and abdominal pain  Hospital Course:  1. left-sided chest pain-patient complained of an episode of left-sided chest pain that was worse when moving her arm. Pain was also reproducible after pressing on the on the chest. She she arrived to the hospital and her EKG showed negative T waves in the inferior leads. Repeat EKG did not show any dynamic changes. The chest pain improved during the hospitalization. Cardiac enzymes were within normal limits. Patient was discharged home on aspirin and and she will followup with cardiology office for a stress test. Of note patient also had a CT scan of the chest with IV contrast which effectively ruled out pulmonary emboli 2. abdominal pain. The patient had normal liver function tests and CT scan of the abdomen did not show any biliary pathology in the post cholecystectomy bed. Nature of the abdominal pain is unclear but I suspect is normal postoperative healing  Procedures:  CT scan abdomen and pelvis  CT scan chest  Consultations:  None  Discharge Exam: Filed Vitals:   11/20/11 1320  BP: 124/76  Pulse: 74  Temp: 98.5 F (36.9 C)  Resp: 18   Filed Vitals:   11/20/11 0045 11/20/11 0230 11/20/11 0541 11/20/11 1320  BP: 119/61 107/64 110/68 124/76  Pulse: 70  74 59 74  Temp:  98.3 F (36.8 C) 98.5 F (36.9 C) 98.5 F (36.9 C)  TempSrc:  Oral Oral   Resp: 20 14 18 18   Weight:   88.814 kg (195 lb 12.8 oz)   SpO2: 97% 96% 98% 97%   General: Alert oriented x3 Cardiovascular: Regular rate and rhythm without murmur  Respiratory: Clear to auscultation bilaterally  Discharge Instructions  Discharge Orders    Future Appointments: Provider: Department: Dept Phone: Center:   11/21/2011 8:00 AM [facility] [name] [facility] [phone] [facility]     Future Orders Please Complete By Expires   Diet general      Increase activity slowly        Medication List  As of 11/20/2011  4:53 PM   TAKE these medications         aspirin 81 MG EC tablet   Take 1 tablet (81 mg total) by mouth daily.           Follow-up Information    Follow up with [facility]. (We will arrange outpatient exercise stress test.)    Contact information:   [address] [place] [phone]      Schedule an appointment as soon as possible for a visit with Roxy Manns, MD.   Contact information:   [address] [place] [address] [place] [address] [phone]           The results of significant diagnostics from this hospitalization (including imaging, microbiology, ancillary and laboratory) are listed below for reference.    Significant Diagnostic Studies: Ct Angio Chest W/cm &/or  Wo Cm  11/20/2011  *RADIOLOGY REPORT*  Clinical Data:  Short of breath, abdominal pain.  Concern pulmonary embolism.  Left chest pain.  CT ANGIOGRAPHY CHEST CT ABDOMEN AND PELVIS WITH CONTRAST  Technique:  Multidetector CT imaging of the chest was performed using the standard protocol during bolus administration of intravenous contrast.  Multiplanar CT image reconstructions including MIPs were obtained to evaluate the vascular anatomy. Multidetector CT imaging of the abdomen and pelvis was performed using the standard protocol during bolus  administration of intravenous contrast.  Contrast: OMNIPAQUE IOHEXOL 350 MG/ML SOLN,  Comparison:   None.  CTA CHEST  Findings:  There are  no filling defects of the pulmonary suggest acute pulmonary embolism.  No acute findings of the aorta or great vessels.  No pericardial fluid.  Esophagus is normal.  No mediastinal or hilar lymphadenopathy.  Review of the lung parenchyma demonstrates no pleural fluid or pulmonary edema.  No pneumothorax.  There is no chest wall abnormality.   Review of the MIP images confirms the above findings.  IMPRESSION: No evidence of pulmonary embolism.   No chest wall abnormality.  CT ABDOMEN AND PELVIS  Findings: Small low density lesion in the left hepatic lobe measures 8 mm has simple fluid attenuation consistent with a hepatic cyst.  Prior cholecystectomy.  The pancreas, spleen, adrenal glands, and kidneys are normal.  The stomach, small bowel, and cecum are normal.  Colon and rectosigmoid colon are normal.  Abdominal aorta normal caliber.  No retroperitoneal or periportal lymphadenopathy.  No peritoneal disease.  No free fluid the pelvis.  Bladder and ovaries are normal. Review of  bone windows demonstrates no aggressive osseous lesions.  Review of the MIP images confirms the above findings.  IMPRESSION:  1.  No acute abdominal or pelvic findings. 2.  Cholecystectomy.  Original Report Authenticated By: Genevive Bi, M.D.   Ct Abdomen Pelvis W Contrast  11/20/2011  *RADIOLOGY REPORT*  Clinical Data:  Short of breath, abdominal pain.  Concern pulmonary embolism.  Left chest pain.  CT ANGIOGRAPHY CHEST CT ABDOMEN AND PELVIS WITH CONTRAST  Technique:  Multidetector CT imaging of the chest was performed using the standard protocol during bolus administration of intravenous contrast.  Multiplanar CT image reconstructions including MIPs were obtained to evaluate the vascular anatomy. Multidetector CT imaging of the abdomen and pelvis was performed using the standard protocol  during bolus administration of intravenous contrast.  Contrast: OMNIPAQUE IOHEXOL 350 MG/ML SOLN,  Comparison:   None.  CTA CHEST  Findings:  There are  no filling defects of the pulmonary suggest acute pulmonary embolism.  No acute findings of the aorta or great vessels.  No pericardial fluid.  Esophagus is normal.  No mediastinal or hilar lymphadenopathy.  Review of the lung parenchyma demonstrates no pleural fluid or pulmonary edema.  No pneumothorax.  There is no chest wall abnormality.   Review of the MIP images confirms the above findings.  IMPRESSION: No evidence of pulmonary embolism.   No chest wall abnormality.  CT ABDOMEN AND PELVIS  Findings: Small low density lesion in the left hepatic lobe measures 8 mm has simple fluid attenuation consistent with a hepatic cyst.  Prior cholecystectomy.  The pancreas, spleen, adrenal glands, and kidneys are normal.  The stomach, small bowel, and cecum are normal.  Colon and rectosigmoid colon are normal.  Abdominal aorta normal caliber.  No retroperitoneal or periportal lymphadenopathy.  No peritoneal disease.  No free fluid the pelvis.  Bladder and ovaries are  normal. Review of  bone windows demonstrates no aggressive osseous lesions.  Review of the MIP images confirms the above findings.  IMPRESSION:  1.  No acute abdominal or pelvic findings. 2.  Cholecystectomy.  Original Report Authenticated By: Genevive Bi, M.D.    Microbiology: No results found for this or any previous visit (from the past 240 hour(s)).   Labs: Basic Metabolic Panel:  Lab 11/19/11 1610  NA 137  K 3.3*  CL 100  CO2 26  GLUCOSE 113*  BUN 7  CREATININE 0.65  CALCIUM 9.5  MG --  PHOS --   Liver Function Tests:  Lab 11/19/11 2334  AST 19  ALT 21  ALKPHOS 81  BILITOT 0.2*  PROT 7.9  ALBUMIN 4.0    Lab 11/19/11 2334  LIPASE 44  AMYLASE --   No results found for this basename: AMMONIA:5 in the last 168 hours CBC:  Lab 11/19/11 2334  WBC 9.4  NEUTROABS  --  HGB 13.0  HCT 37.5  MCV 81.5  PLT 287   Cardiac Enzymes:  Lab 11/20/11 0938 11/20/11 0518  CKTOTAL 103 --  CKMB 1.7 --  CKMBINDEX -- --  TROPONINI <0.30 <0.30   BNP: BNP (last 3 results) No results found for this basename: PROBNP:3 in the last 8760 hours CBG: No results found for this basename: GLUCAP:5 in the last 168 hours  Time coordinating discharge: 35 minutes  Signed:  Lonia Blood, MD  [facility] 11/20/2011, 4:53 PM

## 2011-11-20 NOTE — H&P (Signed)
[facility]                                                                                    Patient Demographics  Victoria [name], is a 51 y.o. female  CSN: [id number]  MRN: [id number]  DOB - [birth year]  Admit Date - 11/19/2011  Outpatient Primary MD for the patient is Roxy Manns, MD   With History of -  Past Medical History  Diagnosis Date  . Allergic rhinitis   . Anemia, iron deficiency   . Heavy menses   . Uterine fibroid   . Lipoma     on L arm      Past Surgical History  Procedure Date  . Cesarean section   . Tubal ligation   . Tonsillectomy   . Uterine ultrasound 06/1998    and endo biopsy- neg  . Pelvic u/s and transvaginal 04/27/2009    fibroids x 2  . Endometrial ablation     fibroids  . Cholecystectomy     11/07/11    in for   Chief Complaint  Patient presents with  . Chest Pain     HPI  Victoria [name]  is a 51 y.o. female, without significant past medical history, presents with complaints of chest pain, patient has recent history of laparoscopic cholecystectomy done at [facility] before 2 weeks, patient reports surgery went without complication, patient started to complaints of chest pain around epigastric area radiating to the left, started at rest, started on Friday, patient denies any lightheadedness palpitation shortness of breath diaphoresis nausea accompanying this pain, patient EKG only show T-wave inversion in one lead, no other ST changes, negative troponin, and giving the fact patient had recent surgery, CT chest and abdomen with IV contrast were done in ED to rule out any PE or complication of abdominal surgery, and they came back negative for any abnormality.    Review of Systems    In addition to the HPI above,  No Fever-chills, No Headache, No changes with Vision or hearing, No problems swallowing food or Liquids, Complaints of Chest pain, denies Cough or Shortness of Breath, No Abdominal pain, No Nausea or  Vommitting, Bowel movements are regular, No Blood in stool or Urine, No dysuria, No new skin rashes or bruises, No new joints pains-aches,  No new weakness, tingling, numbness in any extremity, No recent weight gain or loss, No polyuria, polydypsia or polyphagia, No significant Mental Stressors.  A full 10 point Review of Systems was done, except as stated above, all other Review of Systems were negative.   Social History History  Substance Use Topics  . Smoking status: Never Smoker   . Smokeless tobacco: Never Used  . Alcohol Use: No     Family History Family History  Problem Relation Age of Onset  . Diabetes Mother   . Hypertension Mother   . Hyperlipidemia Mother   . Cancer Mother     uterine cancer  . Diabetes Father   . Alcohol abuse Father   . Hypertension Sister   . Diabetes Sister     boarderline  . Alcohol abuse Brother   . Hypertension Brother   .  Diabetes Maternal Grandmother   . Alcohol abuse Paternal Grandfather   . Alcohol abuse Brother   . Alcohol abuse Brother   . Alcohol abuse Brother   . Alcohol abuse Sister   . Diabetes Sister     severe  . Colon cancer Neg Hx      Prior to Admission medications   Not on File    Allergies  Allergen Reactions  . Codeine     REACTION: nausea and vomiting  . Doxycycline Nausea Only    Physical Exam  Vitals  Blood pressure 107/64, pulse 74, temperature 98.3 F (36.8 C), temperature source Oral, resp. rate 14, SpO2 96.00%.   1. General well-nourished female lying in bed in NAD,    2. Normal affect and insight, Not Suicidal or Homicidal, Awake Alert, Oriented *3.  3. No F.N deficits, ALL C.Nerves Intact, Strength 5/5 all 4 extremities, Sensation intact all 4 extremities, Plantars down going.  4. Ears and Eyes appear Normal, Conjunctivae clear, PERRLA. Moist Oral Mucosa.  5. Supple Neck, No JVD, No cervical lymphadenopathy appriciated, No Carotid Bruits.  6. Symmetrical Chest wall movement,  Good air movement bilaterally, CTAB. Chest pain reproduced by deep inspiration  7. RRR, No Gallops, Rubs or Murmurs, No Parasternal Heave.  8. Positive Bowel Sounds, Abdomen Soft, Non tender, No organomegaly appriciated,       No rebound -guarding or rigidity.  9.  No Cyanosis, Normal Skin Turgor, No Skin Rash or Bruise.  10. Good muscle tone,  joints appear normal , no effusions, Normal ROM.  11. No Palpable Lymph Nodes in Neck or Axillae    Data Review  CBC  Lab 11/19/11 2334  WBC 9.4  HGB 13.0  HCT 37.5  PLT 287  MCV 81.5  MCH 28.3  MCHC 34.7  RDW 12.4  LYMPHSABS --  MONOABS --  EOSABS --  BASOSABS --  BANDABS --   ------------------------------------------------------------------------------------------------------------------  Chemistries   Lab 11/19/11 2334  NA 137  K 3.3*  CL 100  CO2 26  GLUCOSE 113*  BUN 7  CREATININE 0.65  CALCIUM 9.5  MG --  AST 19  ALT 21  ALKPHOS 81  BILITOT 0.2*   ------------------------------------------------------------------------------------------------------------------ CrCl is unknown because both a height and weight (above a minimum accepted value) are required for this calculation. ------------------------------------------------------------------------------------------------------------------ No results found for this basename: TSH,T4TOTAL,FREET3,T3FREE,THYROIDAB in the last 72 hours   Coagulation profile No results found for this basename: INR:5,PROTIME:5 in the last 168 hours ------------------------------------------------------------------------------------------------------------------- No results found for this basename: DDIMER:2 in the last 72 hours -------------------------------------------------------------------------------------------------------------------  Cardiac Enzymes No results found for this basename: CK:3,CKMB:3,TROPONINI:3,MYOGLOBIN:3 in the last 168  hours ------------------------------------------------------------------------------------------------------------------ No components found with this basename: POCBNP:3   ---------------------------------------------------------------------------------------------------------------  Urinalysis    Component Value Date/Time   COLORURINE YELLOW 11/20/2011 0015   APPEARANCEUR CLOUDY* 11/20/2011 0015   LABSPEC 1.019 11/20/2011 0015   PHURINE 6.5 11/20/2011 0015   GLUCOSEU NEGATIVE 11/20/2011 0015   HGBUR SMALL* 11/20/2011 0015   HGBUR large 09/10/2009 0800   BILIRUBINUR NEGATIVE 11/20/2011 0015   BILIRUBINUR negative 08/19/2011 1330   KETONESUR NEGATIVE 11/20/2011 0015   PROTEINUR NEGATIVE 11/20/2011 0015   UROBILINOGEN 0.2 11/20/2011 0015   UROBILINOGEN negative 08/19/2011 1330   NITRITE NEGATIVE 11/20/2011 0015   NITRITE negative 08/19/2011 1330   LEUKOCYTESUR LARGE* 11/20/2011 0015    ----------------------------------------------------------------------------------------------------------------  Imaging results:   Ct Angio Chest W/cm &/or Wo Cm  11/20/2011  *RADIOLOGY REPORT*  Clinical Data:  Short of breath, abdominal  pain.  Concern pulmonary embolism.  Left chest pain.  CT ANGIOGRAPHY CHEST CT ABDOMEN AND PELVIS WITH CONTRAST  Technique:  Multidetector CT imaging of the chest was performed using the standard protocol during bolus administration of intravenous contrast.  Multiplanar CT image reconstructions including MIPs were obtained to evaluate the vascular anatomy. Multidetector CT imaging of the abdomen and pelvis was performed using the standard protocol during bolus administration of intravenous contrast.  Contrast: OMNIPAQUE IOHEXOL 350 MG/ML SOLN,  Comparison:   None.  CTA CHEST  Findings:  There are  no filling defects of the pulmonary suggest acute pulmonary embolism.  No acute findings of the aorta or great vessels.  No pericardial fluid.  Esophagus is normal.  No mediastinal or hilar  lymphadenopathy.  Review of the lung parenchyma demonstrates no pleural fluid or pulmonary edema.  No pneumothorax.  There is no chest wall abnormality.   Review of the MIP images confirms the above findings.  IMPRESSION: No evidence of pulmonary embolism.   No chest wall abnormality.  CT ABDOMEN AND PELVIS  Findings: Small low density lesion in the left hepatic lobe measures 8 mm has simple fluid attenuation consistent with a hepatic cyst.  Prior cholecystectomy.  The pancreas, spleen, adrenal glands, and kidneys are normal.  The stomach, small bowel, and cecum are normal.  Colon and rectosigmoid colon are normal.  Abdominal aorta normal caliber.  No retroperitoneal or periportal lymphadenopathy.  No peritoneal disease.  No free fluid the pelvis.  Bladder and ovaries are normal. Review of  bone windows demonstrates no aggressive osseous lesions.  Review of the MIP images confirms the above findings.  IMPRESSION:  1.  No acute abdominal or pelvic findings. 2.  Cholecystectomy.  Original Report Authenticated By: Genevive Bi, M.D.   Ct Abdomen Pelvis W Contrast  11/20/2011  *RADIOLOGY REPORT*  Clinical Data:  Short of breath, abdominal pain.  Concern pulmonary embolism.  Left chest pain.  CT ANGIOGRAPHY CHEST CT ABDOMEN AND PELVIS WITH CONTRAST  Technique:  Multidetector CT imaging of the chest was performed using the standard protocol during bolus administration of intravenous contrast.  Multiplanar CT image reconstructions including MIPs were obtained to evaluate the vascular anatomy. Multidetector CT imaging of the abdomen and pelvis was performed using the standard protocol during bolus administration of intravenous contrast.  Contrast: OMNIPAQUE IOHEXOL 350 MG/ML SOLN,  Comparison:   None.  CTA CHEST  Findings:  There are  no filling defects of the pulmonary suggest acute pulmonary embolism.  No acute findings of the aorta or great vessels.  No pericardial fluid.  Esophagus is normal.  No mediastinal  or hilar lymphadenopathy.  Review of the lung parenchyma demonstrates no pleural fluid or pulmonary edema.  No pneumothorax.  There is no chest wall abnormality.   Review of the MIP images confirms the above findings.  IMPRESSION: No evidence of pulmonary embolism.   No chest wall abnormality.  CT ABDOMEN AND PELVIS  Findings: Small low density lesion in the left hepatic lobe measures 8 mm has simple fluid attenuation consistent with a hepatic cyst.  Prior cholecystectomy.  The pancreas, spleen, adrenal glands, and kidneys are normal.  The stomach, small bowel, and cecum are normal.  Colon and rectosigmoid colon are normal.  Abdominal aorta normal caliber.  No retroperitoneal or periportal lymphadenopathy.  No peritoneal disease.  No free fluid the pelvis.  Bladder and ovaries are normal. Review of  bone windows demonstrates no aggressive osseous lesions.  Review of the  MIP images confirms the above findings.  IMPRESSION:  1.  No acute abdominal or pelvic findings. 2.  Cholecystectomy.  Original Report Authenticated By: Genevive Bi, M.D.    My personal review of EKG: Rhythm NSR, T-wave inversion in V3   Assessment & Plan  Principal Problem:  *Chest pain    1. chest pain. Patient has negative troponins and no significant EKG changes, pain is reproducible by deep inspiration, unlikely of cardiac origin, will admit for observation, we'll cycle 3 sets total of cardiac enzyme, will check lipid panels in a.m. given 325 of aspirin in ED, with her cardiac enzymes are negative may be discharged and follow with her PCP as an outpatient.   DVT Prophylaxis Heparin  AM Labs Ordered, also please review Full Orders  Admission, patients condition and plan of care including tests being ordered have been discussed with the patient who indicate understanding and agree with the plan and Code Status.  Code Status full  Condition GUARDED  Dymond Gutt M.D on 11/20/2011 at 4:05 AM

## 2011-11-20 NOTE — Progress Notes (Signed)
Pt. Discharged 11/20/2011  1:38 PM Discharge instructions reviewed with patient/family. Patient/family verbalized understanding. All Rx's given. Questions answered as needed. Pt. Discharged to home with family/self.  Noah Charon

## 2011-11-21 ENCOUNTER — Ambulatory Visit
Admission: RE | Admit: 2011-11-21 | Discharge: 2011-11-21 | Disposition: A | Discharge status: Home / Self Care | Payer: No Typology Code available for payment source | Source: Ambulatory Visit | Attending: Family Medicine | Admitting: Family Medicine

## 2011-11-21 DIAGNOSIS — N6009 Solitary cyst of unspecified breast: Secondary | ICD-10-CM

## 2012-04-19 ENCOUNTER — Other Ambulatory Visit: Payer: Self-pay | Admitting: Family Medicine

## 2012-04-19 DIAGNOSIS — N6009 Solitary cyst of unspecified breast: Secondary | ICD-10-CM

## 2012-05-28 ENCOUNTER — Ambulatory Visit
Admission: RE | Admit: 2012-05-28 | Discharge: 2012-05-28 | Disposition: A | Discharge status: Home / Self Care | Payer: No Typology Code available for payment source | Source: Ambulatory Visit | Attending: Family Medicine | Admitting: Family Medicine

## 2012-05-28 DIAGNOSIS — N6009 Solitary cyst of unspecified breast: Secondary | ICD-10-CM

## 2012-05-29 ENCOUNTER — Encounter: Payer: Self-pay | Admitting: *Deleted

## 2012-06-12 ENCOUNTER — Encounter: Payer: Self-pay | Admitting: Family Medicine

## 2012-06-12 ENCOUNTER — Ambulatory Visit: Payer: No Typology Code available for payment source | Admitting: Family Medicine

## 2012-06-12 ENCOUNTER — Ambulatory Visit (INDEPENDENT_AMBULATORY_CARE_PROVIDER_SITE_OTHER): Payer: No Typology Code available for payment source | Admitting: Family Medicine

## 2012-06-12 VITALS — BP 114/72 | HR 74 | Temp 98.3°F | Ht 63.0 in | Wt 205.5 lb

## 2012-06-12 DIAGNOSIS — J069 Acute upper respiratory infection, unspecified: Secondary | ICD-10-CM | POA: Insufficient documentation

## 2012-06-12 MED ORDER — TRAMADOL HCL 50 MG PO TABS: 50.0000 mg | ORAL_TABLET | Freq: Every evening | ORAL | Status: DC | PRN

## 2012-06-12 MED ORDER — BENZONATATE 200 MG PO CAPS: 200.0000 mg | ORAL_CAPSULE | Freq: Three times a day (TID) | ORAL | Status: DC | PRN

## 2012-06-12 NOTE — Assessment & Plan Note (Signed)
Cyclic post viral cough  Pt does not tolerate narcotics due to nausea Disc symptomatic care - see instructions on AVS  Trial of tessalon  Also tramadol at night for cough Update if not starting to improve in a week or if worsening

## 2012-06-12 NOTE — Patient Instructions (Addendum)
Drink lots of fluids and try to get rest  mucinex DM is ok as directed  Tessalon pills- can swallow one up to three times daily for cough At night - try tramadol also 50 mg for cough  Update if not starting to improve in a week or if worsening

## 2012-06-12 NOTE — Progress Notes (Signed)
Subjective:    Patient ID: Victoria [name], female    DOB: November 19, 1960, 52 y.o.   MRN: [id number]  HPI Here for uri symptoms for 2 weeks  Cannot shake the cold  Eve worst- coughing all night - productive cough - still has color to mucous  Taking mucinex DM otc   Nasal cong and running  Some pain in R cheek that was worse last week ST Ear R was hurting -that is improved   First few days/ nights had a fever - that is better now  Cough is the most bothersome symptom  Patient Active Problem List  Diagnosis  . LIPOMA  . FIBROIDS, UTERUS  . OBESITY  . OTHER SPECIFIED ANEMIAS  . ALLERGIC RHINITIS  . UTERINE ENLARGEMENT  . MENORRHAGIA  . ECCHYMOSES, SPONTANEOUS  . Routine general medical examination at a health care facility  . Gynecological examination  . Special screening for malignant neoplasms, colon  . Hyperlipidemia  . Abdominal pain, right upper quadrant  . Gallstones  . Chest pain   Past Medical History  Diagnosis Date  . Allergic rhinitis   . Anemia, iron deficiency   . Heavy menses   . Uterine fibroid   . Lipoma     on L arm   Past Surgical History  Procedure Date  . Cesarean section   . Tubal ligation   . Tonsillectomy   . Uterine ultrasound 06/1998    and endo biopsy- neg  . Pelvic u/s and transvaginal 04/27/2009    fibroids x 2  . Endometrial ablation     fibroids  . Cholecystectomy     11/07/11   History  Substance Use Topics  . Smoking status: Never Smoker   . Smokeless tobacco: Never Used  . Alcohol Use: No   Family History  Problem Relation Age of Onset  . Diabetes Mother   . Hypertension Mother   . Hyperlipidemia Mother   . Cancer Mother     uterine cancer  . Diabetes Father   . Alcohol abuse Father   . Hypertension Sister   . Diabetes Sister     boarderline  . Alcohol abuse Brother   . Hypertension Brother   . Diabetes Maternal Grandmother   . Alcohol abuse Paternal Grandfather   . Alcohol abuse Brother   . Alcohol abuse  Brother   . Alcohol abuse Brother   . Alcohol abuse Sister   . Diabetes Sister     severe  . Colon cancer Neg Hx    Allergies  Allergen Reactions  . Codeine     REACTION: nausea and vomiting  . Doxycycline Nausea Only   Current Outpatient Prescriptions on File Prior to Visit  Medication Sig Dispense Refill  . aspirin EC 81 MG EC tablet Take 1 tablet (81 mg total) by mouth daily.         Review of Systems Review of Systems  Constitutional: Negative for fever, appetite change, and unexpected weight change.  ENT pos for rhinorrhea and mild cong and ST, neg for sinus pain  Eyes: Negative for pain and visual disturbance.  Respiratory: Negative for wheeze and shortness of breath.   Cardiovascular: Negative for cp or palpitations    Gastrointestinal: Negative for nausea, diarrhea and constipation.  Genitourinary: Negative for urgency and frequency.  Skin: Negative for pallor or rash   Neurological: Negative for weakness, light-headedness, numbness and headaches.  Hematological: Negative for adenopathy. Does not bruise/bleed easily.  Psychiatric/Behavioral: Negative for dysphoric mood. The patient  is not nervous/anxious.         Objective:   Physical Exam  Constitutional: She appears well-developed and well-nourished. No distress.  HENT:  Head: Normocephalic and atraumatic.  Right Ear: External ear normal.  Left Ear: External ear normal.  Mouth/Throat: Oropharynx is clear and moist. No oropharyngeal exudate.       Nares are injected and congested  No sinus tenderness Throat clear   Eyes: Conjunctivae normal and EOM are normal. Pupils are equal, round, and reactive to light. Right eye exhibits no discharge. Left eye exhibits no discharge.  Neck: Normal range of motion. Neck supple. No thyromegaly present.  Cardiovascular: Normal rate and regular rhythm.   Pulmonary/Chest: Effort normal and breath sounds normal. No respiratory distress. She has no wheezes. She has no rales. She  exhibits no tenderness.       Dry hacking cough  Lymphadenopathy:    She has no cervical adenopathy.  Neurological: She is alert. She has normal reflexes.  Skin: Skin is warm and dry. No rash noted. No erythema. No pallor.  Psychiatric: She has a normal mood and affect.          Assessment & Plan:

## 2012-08-06 ENCOUNTER — Encounter: Payer: Self-pay | Admitting: Family Medicine

## 2012-08-06 ENCOUNTER — Ambulatory Visit (INDEPENDENT_AMBULATORY_CARE_PROVIDER_SITE_OTHER): Payer: No Typology Code available for payment source | Admitting: Family Medicine

## 2012-08-06 VITALS — BP 124/72 | HR 95 | Temp 98.7°F | Ht 63.0 in | Wt 205.2 lb

## 2012-08-06 DIAGNOSIS — R110 Nausea: Secondary | ICD-10-CM

## 2012-08-06 DIAGNOSIS — R350 Frequency of micturition: Secondary | ICD-10-CM

## 2012-08-06 LAB — POCT URINALYSIS DIPSTICK
Bilirubin, UA: NEGATIVE
Glucose, UA: NEGATIVE
Ketones, UA: NEGATIVE
Nitrite, UA: NEGATIVE
pH, UA: 7

## 2012-08-06 LAB — POCT UA - MICROSCOPIC ONLY
Bacteria, U Microscopic: 0
Crystals, Ur, HPF, POC: 0
RBC, urine, microscopic: 0

## 2012-08-06 MED ORDER — RANITIDINE HCL 150 MG PO TABS: 150.0000 mg | ORAL_TABLET | Freq: Two times a day (BID) | ORAL | Status: DC

## 2012-08-06 NOTE — Patient Instructions (Addendum)
Avoid carbonation and caffeine and spicy foods  Try the zantac 150 mg twice daily  Let me know within 2 weeks if not better  We will also do urine culture

## 2012-08-06 NOTE — Assessment & Plan Note (Signed)
Some suprapubic tenderness- but pt does have fibroids also cx urine (blood on dip but not micro)_ Enc good water intake

## 2012-08-06 NOTE — Progress Notes (Signed)
Subjective:    Patient ID: Victoria [name], female    DOB: January 13, 1961, 52 y.o.   MRN: [id number]  HPI Here with nausea -ongoing all the time  For about 2 mo  ccy was in the summer - had some symptoms afterwards - and zantac helped - stopped it when she no longer needed it  No pain  Episodes of nausea - last 10-15 min and has to sit down  No chance pregnant - she is in menopause - having hot flashes (last peroid was in sept )   No abd pain  Does not have heartburn regularly - occ when she eats the wrong thing - takes zantac   Takes baby asa qd No other nsaids or other meds   Not really stressed   Some frequency of urination/ no blood in urine or dysuria or fever   Patient Active Problem List  Diagnosis  . LIPOMA  . FIBROIDS, UTERUS  . OBESITY  . OTHER SPECIFIED ANEMIAS  . ALLERGIC RHINITIS  . UTERINE ENLARGEMENT  . MENORRHAGIA  . ECCHYMOSES, SPONTANEOUS  . Routine general medical examination at a health care facility  . Gynecological examination  . Special screening for malignant neoplasms, colon  . Hyperlipidemia  . Abdominal pain, right upper quadrant  . Gallstones  . Chest pain   Past Medical History  Diagnosis Date  . Allergic rhinitis   . Anemia, iron deficiency   . Heavy menses   . Uterine fibroid   . Lipoma     on L arm   Past Surgical History  Procedure Laterality Date  . Cesarean section    . Tubal ligation    . Tonsillectomy    . Uterine ultrasound  06/1998    and endo biopsy- neg  . Pelvic u/s and transvaginal  04/27/2009    fibroids x 2  . Endometrial ablation      fibroids  . Cholecystectomy      11/07/11   History  Substance Use Topics  . Smoking status: Never Smoker   . Smokeless tobacco: Never Used  . Alcohol Use: No   Family History  Problem Relation Age of Onset  . Diabetes Mother   . Hypertension Mother   . Hyperlipidemia Mother   . Cancer Mother     uterine cancer  . Diabetes Father   . Alcohol abuse Father   .  Hypertension Sister   . Diabetes Sister     boarderline  . Alcohol abuse Brother   . Hypertension Brother   . Diabetes Maternal Grandmother   . Alcohol abuse Paternal Grandfather   . Alcohol abuse Brother   . Alcohol abuse Brother   . Alcohol abuse Brother   . Alcohol abuse Sister   . Diabetes Sister     severe  . Colon cancer Neg Hx    Allergies  Allergen Reactions  . Codeine     REACTION: nausea and vomiting  . Doxycycline Nausea Only   Current Outpatient Prescriptions on File Prior to Visit  Medication Sig Dispense Refill  . aspirin EC 81 MG EC tablet Take 1 tablet (81 mg total) by mouth daily.       No current facility-administered medications on file prior to visit.       Review of Systems Review of Systems  Constitutional: Negative for fever, appetite change, fatigue and unexpected weight change.  Eyes: Negative for pain and visual disturbance.  Respiratory: Negative for cough and shortness of breath.   Cardiovascular: Negative  for cp or palpitations    Gastrointestinal: Negative for nausea, diarrhea and constipation. pos for occ heartburn/ neg for abd pain or blood in stool Genitourinary: Negative for urgency and hematuria and dysuria and flank pain  Skin: Negative for pallor or rash   Neurological: Negative for weakness, light-headedness, numbness and headaches.  Hematological: Negative for adenopathy. Does not bruise/bleed easily.  Psychiatric/Behavioral: Negative for dysphoric mood. The patient is not nervous/anxious.         Objective:   Physical Exam  Constitutional: She appears well-developed and well-nourished. No distress.  obese and well appearing   HENT:  Head: Normocephalic and atraumatic.  Mouth/Throat: Oropharynx is clear and moist.  Eyes: Conjunctivae and EOM are normal. Pupils are equal, round, and reactive to light. No scleral icterus.  Neck: Normal range of motion. Neck supple. No thyromegaly present.  Cardiovascular: Normal rate and  regular rhythm.  Exam reveals no gallop.   Rate 90 at rest   Pulmonary/Chest: Effort normal and breath sounds normal. No respiratory distress. She has no wheezes.  Abdominal: Soft. Bowel sounds are normal. She exhibits no distension and no mass. There is tenderness. There is no rebound and no guarding.  Mild suprapubic tenderness Scars from ccy are present No HSM  Musculoskeletal: She exhibits no edema.  Lymphadenopathy:    She has no cervical adenopathy.  Neurological: She is alert. She has normal reflexes.  Skin: Skin is warm and dry. No rash noted. No erythema. No pallor.  Brisk cap refil and nl turgor   Psychiatric: She has a normal mood and affect.          Assessment & Plan:

## 2012-08-06 NOTE — Assessment & Plan Note (Signed)
ua - had some blood on dip but not micro  Will cx that  Suspect gastritis  Will try zantac-that has helped in the past/ also disc dietary change  If not eff- will check labs and consider poss of CBD stone  Will update

## 2012-08-08 LAB — URINE CULTURE

## 2012-12-03 ENCOUNTER — Other Ambulatory Visit: Payer: No Typology Code available for payment source

## 2012-12-10 ENCOUNTER — Encounter: Payer: No Typology Code available for payment source | Admitting: Family Medicine

## 2013-02-10 ENCOUNTER — Telehealth: Payer: Self-pay | Admitting: Family Medicine

## 2013-02-10 DIAGNOSIS — E785 Hyperlipidemia, unspecified: Secondary | ICD-10-CM

## 2013-02-10 DIAGNOSIS — Z0000 Encounter for general adult medical examination without abnormal findings: Secondary | ICD-10-CM

## 2013-02-10 NOTE — Telephone Encounter (Signed)
Message copied by Judy Pimple on Sun Feb 10, 2013  6:31 PM ------      Message from: Baldomero Lamy      Created: Tue Feb 05, 2013  1:43 PM      Regarding: Cpx labs Mon 9/29       Please order  future cpx labs for pt's upcoming lab appt.      Thanks      Tasha       ------

## 2013-02-11 ENCOUNTER — Other Ambulatory Visit (INDEPENDENT_AMBULATORY_CARE_PROVIDER_SITE_OTHER): Payer: No Typology Code available for payment source

## 2013-02-11 DIAGNOSIS — E785 Hyperlipidemia, unspecified: Secondary | ICD-10-CM

## 2013-02-11 DIAGNOSIS — D6489 Other specified anemias: Secondary | ICD-10-CM

## 2013-02-11 DIAGNOSIS — E669 Obesity, unspecified: Secondary | ICD-10-CM

## 2013-02-11 DIAGNOSIS — Z0000 Encounter for general adult medical examination without abnormal findings: Secondary | ICD-10-CM

## 2013-02-11 LAB — CBC WITH DIFFERENTIAL/PLATELET
Eosinophils Relative: 1.1 % (ref 0.0–5.0)
HCT: 38.3 % (ref 36.0–46.0)
Hemoglobin: 13.1 g/dL (ref 12.0–15.0)
Lymphs Abs: 2 10*3/uL (ref 0.7–4.0)
MCV: 80.8 fl (ref 78.0–100.0)
Monocytes Absolute: 0.3 10*3/uL (ref 0.1–1.0)
Monocytes Relative: 5.1 % (ref 3.0–12.0)
Neutro Abs: 3.9 10*3/uL (ref 1.4–7.7)
WBC: 6.4 10*3/uL (ref 4.5–10.5)

## 2013-02-11 LAB — COMPREHENSIVE METABOLIC PANEL
BUN: 10 mg/dL (ref 6–23)
CO2: 28 mEq/L (ref 19–32)
Creatinine, Ser: 0.7 mg/dL (ref 0.4–1.2)
GFR: 106.02 mL/min (ref >60.00)
Glucose, Bld: 94 mg/dL (ref 70–99)
Total Bilirubin: 0.4 mg/dL (ref 0.3–1.2)

## 2013-02-11 LAB — LIPID PANEL
Cholesterol: 167 mg/dL (ref 0–200)
HDL: 40 mg/dL (ref >39.00)
Triglycerides: 65 mg/dL (ref 0.0–149.0)
VLDL: 13 mg/dL (ref 0.0–40.0)

## 2013-02-18 ENCOUNTER — Ambulatory Visit (INDEPENDENT_AMBULATORY_CARE_PROVIDER_SITE_OTHER): Payer: No Typology Code available for payment source | Admitting: Family Medicine

## 2013-02-18 ENCOUNTER — Encounter: Payer: Self-pay | Admitting: Family Medicine

## 2013-02-18 VITALS — BP 128/74 | HR 74 | Temp 98.9°F | Ht 63.0 in | Wt 206.0 lb

## 2013-02-18 DIAGNOSIS — Z0000 Encounter for general adult medical examination without abnormal findings: Secondary | ICD-10-CM

## 2013-02-18 DIAGNOSIS — E669 Obesity, unspecified: Secondary | ICD-10-CM

## 2013-02-18 DIAGNOSIS — E785 Hyperlipidemia, unspecified: Secondary | ICD-10-CM

## 2013-02-18 NOTE — Progress Notes (Signed)
Subjective:    Patient ID: Victoria [name], female    DOB: 1960/12/10, 52 y.o.   MRN: [id number]  HPI Here for health maintenance exam and to review chronic medical problems    Is doing ok overall  Having some neck issues and also carpal tunnel-   Wt is up 1 lb with bmi of 36 Diet- trying hard to eat better - sweets are her downfall  Exercise walking every day and trying to do better   Flu vaccine- she declines these   Mammogram was 1/14 - [facility] (will send her a reminder)- had 6 mo f/u once for a cyst  Nl  Self exam-no lumps or changes   Td 7/06  colonosc 3/13 with 10 year recall   Labs Lab Results  Component Value Date   CHOL 167 02/11/2013   CHOL 159 11/20/2011   CHOL 199 06/02/2011   Lab Results  Component Value Date   HDL 40.00 02/11/2013   HDL 37* 11/20/2011   HDL 41.40 06/02/2011   Lab Results  Component Value Date   LDLCALC 114* 02/11/2013   LDLCALC 108* 11/20/2011   LDLCALC 140* 06/02/2011   Lab Results  Component Value Date   TRIG 65.0 02/11/2013   TRIG 70 11/20/2011   TRIG 90.0 06/02/2011   Lab Results  Component Value Date   CHOLHDL 4 02/11/2013   CHOLHDL 4.3 11/20/2011   CHOLHDL 5 06/02/2011   Her diet is very good for the most part    No results found for this basename: LDLDIRECT     Chemistry      Component Value Date/Time   NA 137 02/11/2013 0905   K 3.9 02/11/2013 0905   CL 103 02/11/2013 0905   CO2 28 02/11/2013 0905   BUN 10 02/11/2013 0905   CREATININE 0.7 02/11/2013 0905      Component Value Date/Time   CALCIUM 9.1 02/11/2013 0905   ALKPHOS 61 02/11/2013 0905   AST 22 02/11/2013 0905   ALT 26 02/11/2013 0905   BILITOT 0.4 02/11/2013 0905     Lab Results  Component Value Date   WBC 6.4 02/11/2013   HGB 13.1 02/11/2013   HCT 38.3 02/11/2013   MCV 80.8 02/11/2013   PLT 289.0 02/11/2013   Lab Results  Component Value Date   TSH 1.80 02/11/2013   She is menopausal  Having hot flashes and night sweats - has to get up frequently  No  more periods  Does not feel her fibroids  No hx of abn pap    Patient Active Problem List   Diagnosis Date Noted  . Nausea alone 08/06/2012  . Urine frequency 08/06/2012  . Abdominal pain, right upper quadrant 10/18/2011  . Gynecological examination 06/08/2011  . Special screening for malignant neoplasms, colon 06/08/2011  . Hyperlipidemia 06/08/2011  . Routine general medical examination at a health care facility 06/01/2011  . LIPOMA 12/30/2009  . ECCHYMOSES, SPONTANEOUS 12/30/2009  . OTHER SPECIFIED ANEMIAS 06/09/2009  . FIBROIDS, UTERUS 04/27/2009  . UTERINE ENLARGEMENT 04/22/2009  . OBESITY 03/19/2007  . ALLERGIC RHINITIS 02/27/2007   Past Medical History  Diagnosis Date  . Allergic rhinitis   . Anemia, iron deficiency   . Heavy menses   . Uterine fibroid   . Lipoma     on L arm   Past Surgical History  Procedure Laterality Date  . Cesarean section    . Tubal ligation    . Tonsillectomy    . Uterine  ultrasound  06/1998    and endo biopsy- neg  . Pelvic u/s and transvaginal  04/27/2009    fibroids x 2  . Endometrial ablation      fibroids  . Cholecystectomy      11/07/11   History  Substance Use Topics  . Smoking status: Never Smoker   . Smokeless tobacco: Never Used  . Alcohol Use: No   Family History  Problem Relation Age of Onset  . Diabetes Mother   . Hypertension Mother   . Hyperlipidemia Mother   . Cancer Mother     uterine cancer  . Diabetes Father   . Alcohol abuse Father   . Hypertension Sister   . Diabetes Sister     boarderline  . Alcohol abuse Brother   . Hypertension Brother   . Diabetes Maternal Grandmother   . Alcohol abuse Paternal Grandfather   . Alcohol abuse Brother   . Alcohol abuse Brother   . Alcohol abuse Brother   . Alcohol abuse Sister   . Diabetes Sister     severe  . Colon cancer Neg Hx    Allergies  Allergen Reactions  . Codeine     REACTION: nausea and vomiting  . Doxycycline Nausea Only   No current  outpatient prescriptions on file prior to visit.   No current facility-administered medications on file prior to visit.    Review of Systems Review of Systems  Constitutional: Negative for fever, appetite change, fatigue and unexpected weight change.  Eyes: Negative for pain and visual disturbance.  Respiratory: Negative for cough and shortness of breath.   Cardiovascular: Negative for cp or palpitations    Gastrointestinal: Negative for nausea, diarrhea and constipation.  Genitourinary: Negative for urgency and frequency.  Skin: Negative for pallor or rash   Neurological: Negative for weakness, light-headedness, numbness and headaches.  Hematological: Negative for adenopathy. Does not bruise/bleed easily.  Psychiatric/Behavioral: Negative for dysphoric mood. The patient is not nervous/anxious.         Objective:   Physical Exam  Constitutional: She appears well-developed and well-nourished. No distress.  obese and well appearing   HENT:  Head: Normocephalic and atraumatic.  Right Ear: External ear normal.  Left Ear: External ear normal.  Nose: Nose normal.  Mouth/Throat: Oropharynx is clear and moist.  Eyes: Conjunctivae and EOM are normal. Pupils are equal, round, and reactive to light. Right eye exhibits no discharge. Left eye exhibits no discharge. No scleral icterus.  Neck: Normal range of motion. Neck supple. No JVD present. Carotid bruit is not present. No thyromegaly present.  Cardiovascular: Normal rate, regular rhythm, normal heart sounds and intact distal pulses.  Exam reveals no gallop.   Pulmonary/Chest: Effort normal and breath sounds normal. No respiratory distress. She has no wheezes. She has no rales.  Abdominal: Soft. Bowel sounds are normal. She exhibits no distension, no abdominal bruit and no mass. There is no tenderness.  Genitourinary: No breast swelling, tenderness, discharge or bleeding.  Breast exam: No mass, nodules, thickening, tenderness, bulging,  retraction, inflamation, nipple discharge or skin changes noted.  No axillary or clavicular LA.    Musculoskeletal: She exhibits no edema and no tenderness.  Lymphadenopathy:    She has no cervical adenopathy.  Neurological: She is alert. She has normal reflexes. No cranial nerve deficit. She exhibits normal muscle tone. Coordination normal.  Skin: Skin is warm and dry. No rash noted. No erythema. No pallor.  Psychiatric: She has a normal mood and affect.  Assessment & Plan:

## 2013-02-18 NOTE — Assessment & Plan Note (Signed)
Disc goals for lipids and reasons to control them Rev labs with pt Rev low sat fat diet in detail   

## 2013-02-18 NOTE — Assessment & Plan Note (Signed)
Discussed how this problem influences overall health and the risks it imposes  Reviewed plan for weight loss with lower calorie diet (via better food choices and also portion control or program like weight watchers) and exercise building up to or more than 30 minutes 5 days per week including some aerobic activity    

## 2013-02-18 NOTE — Patient Instructions (Addendum)
I'm glad you are doing well  Try to watch out for sweets and fats in diet Avoid red meat/ fried foods/ egg yolks/ fatty breakfast meats/ butter, cheese and high fat dairy/ and shellfish   If you change your mind about a flu shot - we have them and most pharmacies and groceries have them also

## 2013-02-18 NOTE — Assessment & Plan Note (Signed)
Reviewed health habits including diet and exercise and skin cancer prevention Also reviewed health mt list, fam hx and immunizations   Wellness labs reviewed  

## 2013-05-14 ENCOUNTER — Other Ambulatory Visit: Payer: Self-pay

## 2013-05-14 DIAGNOSIS — Z1231 Encounter for screening mammogram for malignant neoplasm of breast: Secondary | ICD-10-CM

## 2013-05-27 ENCOUNTER — Ambulatory Visit: Payer: Self-pay | Admitting: Podiatry

## 2013-06-03 ENCOUNTER — Ambulatory Visit (INDEPENDENT_AMBULATORY_CARE_PROVIDER_SITE_OTHER): Payer: PRIVATE HEALTH INSURANCE | Admitting: Podiatry

## 2013-06-03 ENCOUNTER — Ambulatory Visit: Payer: PRIVATE HEALTH INSURANCE

## 2013-06-03 ENCOUNTER — Ambulatory Visit (INDEPENDENT_AMBULATORY_CARE_PROVIDER_SITE_OTHER): Payer: PRIVATE HEALTH INSURANCE

## 2013-06-03 ENCOUNTER — Encounter: Payer: Self-pay | Admitting: Podiatry

## 2013-06-03 VITALS — BP 113/76 | HR 84 | Resp 18

## 2013-06-03 DIAGNOSIS — M722 Plantar fascial fibromatosis: Secondary | ICD-10-CM

## 2013-06-03 DIAGNOSIS — M79609 Pain in unspecified limb: Secondary | ICD-10-CM

## 2013-06-03 MED ORDER — TRIAMCINOLONE ACETONIDE 10 MG/ML IJ SUSP
10.0000 mg | Freq: Once | INTRAMUSCULAR | Status: AC
  Administered 2013-06-03: 10 mg

## 2013-06-03 NOTE — Progress Notes (Signed)
   Subjective:    Patient ID: Victoria [name], female    DOB: Jul 22, 1960, 53 y.o.   MRN: [id number]  HPI It hurts on the bottom of both feet and throbbing and pains on the sides of both feet and been going on for about 6 months and right foot has been worked on before and I got an injection and they wrapped it Patient relates an injection for right heel pain which improved some her symptoms. The left heel/arch seems to be the most symptomatic at this time.   Review of Systems  Constitutional: Negative.   HENT: Negative.   Eyes: Negative.   Respiratory: Negative.   Endocrine: Negative.   Genitourinary: Negative.   Musculoskeletal:       Joint pain and difficulty walking  Skin: Negative.   Allergic/Immunologic: Negative.   Neurological: Negative.   Hematological: Negative.   Psychiatric/Behavioral: Negative.        Objective:   Physical Exam Orientated x3 female  Vascular: DP and PT pulses two over four bilaterally  Neurological: Knee and ankle reflexes equal and reactive bilaterally  Musculoskeletal: Low arch contour noted bilaterally. Mild palpable tenderness proximal one third medial fascial band right without a palpable lesions. Moderate palpable tenderness inferior heel left and mid fascial band left without a palpable lesions.       Assessment & Plan:   Assessment: Bilateral fasciitis left more symptomatic than right  Plan skin is prepped with alcohol and Betadine and 10 mg of Kenalog mixed with 10 mg of plain Xylocaine and 2.5 mg of plain Marcaine injected inferior heel left for Kenalog injection #1.  Digital scan obtained of feet bilaterally for custom foot orthotics for the indication of bilateral fasciitis. Shoeing and stretching discussed. Notify patient on receipt of custom orthotics.

## 2013-06-03 NOTE — Patient Instructions (Signed)

## 2013-06-10 ENCOUNTER — Ambulatory Visit
Admission: RE | Admit: 2013-06-10 | Discharge: 2013-06-10 | Disposition: A | Discharge status: Home / Self Care | Payer: No Typology Code available for payment source | Source: Ambulatory Visit

## 2013-06-10 DIAGNOSIS — Z1231 Encounter for screening mammogram for malignant neoplasm of breast: Secondary | ICD-10-CM

## 2013-06-11 ENCOUNTER — Encounter: Payer: Self-pay | Admitting: Family Medicine

## 2013-06-11 ENCOUNTER — Ambulatory Visit (INDEPENDENT_AMBULATORY_CARE_PROVIDER_SITE_OTHER): Payer: No Typology Code available for payment source | Admitting: Family Medicine

## 2013-06-11 VITALS — BP 122/62 | HR 69 | Temp 97.8°F | Wt 203.8 lb

## 2013-06-11 DIAGNOSIS — M6530 Trigger finger, unspecified finger: Secondary | ICD-10-CM

## 2013-06-11 DIAGNOSIS — M65311 Trigger thumb, right thumb: Secondary | ICD-10-CM | POA: Insufficient documentation

## 2013-06-11 DIAGNOSIS — G245 Blepharospasm: Secondary | ICD-10-CM

## 2013-06-11 NOTE — Progress Notes (Signed)
Pre-visit discussion using our clinic review tool. No additional management support is needed unless otherwise documented below in the visit note.  

## 2013-06-11 NOTE — Progress Notes (Signed)
Subjective:    Patient ID: Victoria [name], female    DOB: 09-29-1960, 53 y.o.   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 Trigger thumb of right hand 06/11/2013  . Eye twitch 06/11/2013  . Nausea alone 08/06/2012  . Urine frequency 08/06/2012  . Abdominal pain, right upper quadrant 10/18/2011  . Gynecological examination 06/08/2011  . Special screening for malignant neoplasms, colon 06/08/2011  . Hyperlipidemia 06/08/2011  . Routine general medical examination at a health care facility 06/01/2011  . LIPOMA 12/30/2009  . ECCHYMOSES, SPONTANEOUS 12/30/2009  . OTHER SPECIFIED ANEMIAS 06/09/2009  . FIBROIDS, UTERUS 04/27/2009  . UTERINE ENLARGEMENT 04/22/2009  . OBESITY 03/19/2007  . ALLERGIC RHINITIS 02/27/2007   Past Medical History  Diagnosis Date  . Allergic rhinitis   . Anemia, iron deficiency   . Heavy menses   . Uterine fibroid   . Lipoma     on L arm   Past Surgical History  Procedure Laterality Date  . Cesarean section    . Tubal ligation    . Tonsillectomy    . Uterine ultrasound  06/1998    and endo biopsy- neg  . Pelvic u/s and transvaginal  04/27/2009    fibroids x 2  . Endometrial ablation      fibroids  . Cholecystectomy      11/07/11   History  Substance Use Topics  . Smoking status: Never Smoker   . Smokeless tobacco: Never Used  . Alcohol Use: No   Family History  Problem Relation Age of Onset  . Diabetes Mother   . Hypertension Mother   . Hyperlipidemia Mother   . Cancer Mother      uterine cancer  . Diabetes Father   . Alcohol abuse Father   . Hypertension Sister   . Diabetes Sister     boarderline  . Alcohol abuse Brother   . Hypertension Brother   . Diabetes Maternal Grandmother   . Alcohol abuse Paternal Grandfather   . Alcohol abuse Brother   . Alcohol abuse Brother   . Alcohol abuse Brother   . Alcohol abuse Sister   . Diabetes Sister     severe  . Colon cancer Neg Hx    Allergies  Allergen Reactions  . Codeine     REACTION: nausea and vomiting  . Doxycycline Nausea Only   Current Outpatient Prescriptions on File Prior to Visit  Medication Sig Dispense Refill  . ranitidine (ZANTAC) 150 MG tablet Take 150 mg by mouth as needed.       No current facility-administered medications on file prior to visit.     Review of Systems Review of Systems  Constitutional: Negative for fever, appetite change, fatigue and unexpected weight change.  Eyes: Negative for pain and visual disturbance.  Respiratory: Negative for cough and shortness of breath.   Cardiovascular: Negative for cp or palpitations    Gastrointestinal: Negative for  nausea, diarrhea and constipation.  Genitourinary: Negative for urgency and frequency.  Skin: Negative for pallor or rash   MSK pos for R thumb pain  Neurological: Negative for weakness, light-headedness, numbness and headaches.  Hematological: Negative for adenopathy. Does not bruise/bleed easily.  Psychiatric/Behavioral: Negative for dysphoric mood. The patient is not nervous/anxious.         Objective:   Physical Exam  Constitutional: She appears well-developed and well-nourished. No distress.  obese and well appearing   HENT:  Head: Normocephalic and atraumatic.  Mouth/Throat: Oropharynx is clear and moist.  Eyes: Conjunctivae and EOM are normal. Pupils are equal, round, and reactive to light. Right eye exhibits no discharge. Left eye exhibits no discharge. No scleral icterus.  No eye twitch noted today No sinus  or temporal tenderness  Neck: Normal range of motion. Neck supple. No JVD present. Carotid bruit is not present.  Musculoskeletal: She exhibits tenderness. She exhibits no edema.       Right hand: She exhibits tenderness. She exhibits normal range of motion, no bony tenderness, no deformity and no laceration.  Some tenderness over dorsal first MCP joint without swelling  Notable triggering with flexion of thumb  No redness or warmth  Lymphadenopathy:    She has no cervical adenopathy.  Neurological: She is alert. She has normal strength and normal reflexes. She displays no atrophy and no tremor. No sensory deficit. She exhibits normal muscle tone.  Skin: Skin is warm and dry. No rash noted. No erythema.  Psychiatric: She has a normal mood and affect.          Assessment & Plan:

## 2013-06-11 NOTE — Patient Instructions (Signed)
I think you have trigger thumb  Try to rest it as much as you can / avoid repetitive movements  Cold compress whenever you can for 10 minutes at a time Ibuprofen over the counter - 400 mg up to every 6 hours with food Make appt with DR Copland at check out Eye twitch will likely go away on its own- if not or worse or vision change - let me know

## 2013-06-11 NOTE — Assessment & Plan Note (Signed)
Suspect this could be stress related Reassuring exam  Will likely be self limiting but pt will update if worse/ or vision change or new symptoms

## 2013-06-11 NOTE — Assessment & Plan Note (Signed)
In a hairdresser  Recommended relative rest when able / ice intermittently and nsaid otc Ref to sport med for eval

## 2013-06-13 ENCOUNTER — Encounter: Payer: Self-pay | Admitting: *Deleted

## 2013-06-26 ENCOUNTER — Ambulatory Visit (INDEPENDENT_AMBULATORY_CARE_PROVIDER_SITE_OTHER): Payer: No Typology Code available for payment source | Admitting: Family Medicine

## 2013-06-26 ENCOUNTER — Encounter: Payer: Self-pay | Admitting: Family Medicine

## 2013-06-26 VITALS — BP 124/78 | HR 67 | Temp 98.5°F | Ht 63.0 in | Wt 204.8 lb

## 2013-06-26 DIAGNOSIS — M659 Synovitis and tenosynovitis, unspecified: Secondary | ICD-10-CM

## 2013-06-26 DIAGNOSIS — M65839 Other synovitis and tenosynovitis, unspecified forearm: Secondary | ICD-10-CM

## 2013-06-26 DIAGNOSIS — M65311 Trigger thumb, right thumb: Secondary | ICD-10-CM

## 2013-06-26 DIAGNOSIS — M65849 Other synovitis and tenosynovitis, unspecified hand: Secondary | ICD-10-CM

## 2013-06-26 DIAGNOSIS — M6530 Trigger finger, unspecified finger: Secondary | ICD-10-CM

## 2013-06-26 NOTE — Progress Notes (Signed)
Pre-visit discussion using our clinic review tool. No additional management support is needed unless otherwise documented below in the visit note.  

## 2013-06-26 NOTE — Progress Notes (Signed)
Date:  06/26/2013   Name:  Victoria [name]   DOB:  [birth year]   MRN:  [id number] Gender: female Age: 53 y.o.  Primary Physician: Loura Pardon, MD  Dear Dr. Glori Bickers,  Thank you for having me see Victoria [name] in consultation today at [facility] at [facility] for her problem with R thumb pain.  As you may recall, she is a 53 y.o. year old female with a history of thumb pain for 2-3 weeks with some intermittent triggering. She works as a Theme park manager, but has had no other significant prior trauma or hand injury.  She uses her thumb all the time with work.  Past Medical History  Diagnosis Date  . Allergic rhinitis   . Anemia, iron deficiency   . Heavy menses   . Uterine fibroid   . Lipoma     on L arm    Past Surgical History  Procedure Laterality Date  . Cesarean section    . Tubal ligation    . Tonsillectomy    . Uterine ultrasound  06/1998    and endo biopsy- neg  . Pelvic u/s and transvaginal  04/27/2009    fibroids x 2  . Endometrial ablation      fibroids  . Cholecystectomy      11/07/11    History   Social History  . Marital Status: Married    Spouse Name: N/A    Number of Children: 3  . Years of Education: N/A   Occupational History  . Cosmotologist    Social History Main Topics  . Smoking status: Never Smoker   . Smokeless tobacco: Never Used  . Alcohol Use: No  . Drug Use: No  . Sexual Activity: None   Other Topics Concern  . None   Social History Narrative  . None    Family History  Problem Relation Age of Onset  . Diabetes Mother   . Hypertension Mother   . Hyperlipidemia Mother   . Cancer Mother     uterine cancer  . Diabetes Father   . Alcohol abuse Father   . Hypertension Sister   . Diabetes Sister     boarderline  . Alcohol abuse Brother   . Hypertension Brother   . Diabetes Maternal Grandmother   . Alcohol abuse Paternal Grandfather   . Alcohol abuse Brother   . Alcohol abuse Brother   . Alcohol abuse Brother   .  Alcohol abuse Sister   . Diabetes Sister     severe  . Colon cancer Neg Hx     Medications and Allergies reviewed  Review of Systems:    GEN: No fevers, chills. Nontoxic. Primarily MSK c/o today. MSK: Detailed in the HPI GI: tolerating PO intake without difficulty Neuro: No numbness, parasthesias, or tingling associated. Otherwise the pertinent positives of the ROS are noted above.   Physical Examination: BP 124/78  Pulse 67  Temp(Src) 98.5 F (36.9 C) (Oral)  Ht 5\' 3"  (1.6 m)  Wt 204 lb 12 oz (92.874 kg)  BMI 36.28 kg/m2  SpO2 98%    GEN: WDWN, NAD, Non-toxic, Alert & Oriented x 3 HEENT: Atraumatic, Normocephalic.  Ears and Nose: No external deformity. EXTR: No clubbing/cyanosis/edema NEURO: Normal gait.  PSYCH: Normally interactive. Conversant. Not depressed or anxious appearing.  Calm demeanor.   R hand Ecchymosis or edema: neg ROM wrist/hand/digits: full  Carpals, MCP's, digits: NT Distal Ulna and Radius: NT Ecchymosis or edema: neg No instability Cysts/nodules: neg Digit  triggering: Thumb, small nodule Finkelstein's test: neg Snuffbox tenderness: neg Scaphoid tubercle: NT Resisted supination: NT Full composite fist, no malrotation Grip, all digits: 5/5 str DIPJT: NT PIP JT: NT MCP JT: NT  tenosynovitis at the volar thumb along sheath Axial load test: neg Phalen's: neg Tinel's: neg Atrophy: neg  Hand sensation: intact    Assessment and Plan: 1. Tenosynovitis R volar thumb sheath 2. Trigger thumb, small nodule  Recommendations: Inject sheath with corticosteroid and relative rest. If this does not work, I would put in thumb spica for 1-2 weeks, because I think most of symptoms from overuse.  Trigger Finger Injection, R Verbal consent was obtained. Risks (including rare risk of infection, potential risk for skin lightening and potential atrophy), benefits and alternatives were discussed. Prepped with Chloraprep and Ethyl Chloride used for  anesthesia. Under sterile conditions, patient injected at palmar crease aiming distally with 45 degree angle towards nodule; injected directly into tendon sheath. Medication flowed freely without resistance.  Needle size: 22 gauge 1 1/2 inch Injection: 1/2 cc of Lidocaine 1% and 1/2 cc of Depo-Medrol 40 mg   We will see the patient back in prn.  Thank you for having [name] see Victoria [name] in consultation.  Feel free to contact me with any questions.  Updated Medication List:   Medication List    Notice As of 06/26/2013 10:32 AM   You have not been prescribed any medications.      Signed,  Maud Deed. Less Woolsey, MD, [place] at [facility] [address] [place], [place] [address] Phone: [phone] Fax: [phone]

## 2013-07-01 ENCOUNTER — Ambulatory Visit (INDEPENDENT_AMBULATORY_CARE_PROVIDER_SITE_OTHER): Payer: PRIVATE HEALTH INSURANCE | Admitting: Podiatry

## 2013-07-01 ENCOUNTER — Encounter: Payer: Self-pay | Admitting: Podiatry

## 2013-07-01 VITALS — BP 119/65 | HR 78 | Resp 18

## 2013-07-01 DIAGNOSIS — M722 Plantar fascial fibromatosis: Secondary | ICD-10-CM

## 2013-07-01 NOTE — Progress Notes (Signed)
   Subjective:    Patient ID: Victoria [name], female    DOB: 03-22-1961, 53 y.o.   MRN: [id number]  HPI my feet are ok today and I am here to pick up my inserts. Patient presents for followup care for fasciitis and dispensing of orthotics. She said 1 Kenalog Injection into the left heel on the visit of 05/24/2013.    Review of Systems     Objective:   Physical Exam  There is mild palpable tenderness in the medial plantar right and left heels bilaterally. Custom foot orthotics contour satisfactorily.      Assessment & Plan:  Assessment: Improving bilateral fasciitis  Plan: Orthotics dispensed with wearing structure provided. Reappoint x6 weeks.

## 2013-07-01 NOTE — Patient Instructions (Signed)

## 2013-07-17 ENCOUNTER — Encounter: Payer: Self-pay | Admitting: *Deleted

## 2013-07-17 ENCOUNTER — Encounter: Payer: Self-pay | Admitting: Family Medicine

## 2013-07-17 ENCOUNTER — Ambulatory Visit (INDEPENDENT_AMBULATORY_CARE_PROVIDER_SITE_OTHER): Payer: No Typology Code available for payment source | Admitting: Family Medicine

## 2013-07-17 VITALS — BP 118/72 | HR 79 | Temp 98.3°F | Wt 207.2 lb

## 2013-07-17 DIAGNOSIS — M659 Synovitis and tenosynovitis, unspecified: Secondary | ICD-10-CM

## 2013-07-17 DIAGNOSIS — M65839 Other synovitis and tenosynovitis, unspecified forearm: Secondary | ICD-10-CM

## 2013-07-17 DIAGNOSIS — M65849 Other synovitis and tenosynovitis, unspecified hand: Secondary | ICD-10-CM

## 2013-07-17 MED ORDER — PREDNISONE 20 MG PO TABS: ORAL_TABLET | ORAL | Status: DC

## 2013-07-17 NOTE — Progress Notes (Signed)
Date:  07/17/2013   Name:  Victoria [name]   DOB:  03-25-1961   MRN:  [id number] Gender: female Age: 53 y.o.  Primary Physician: Loura Pardon, MD  Today returns with increase in pain along flexor tendon, but decreased triggering. 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 No other recent trauma.   06/26/2013 Last OV with Owens Loffler, MD  Thank you for having me see Victoria [name] in consultation today at [facility] at [facility] for her problem with R thumb pain.  As you may recall, she is a 53 y.o. year old female with a history of thumb pain for 2-3 weeks with some intermittent triggering. She works as a Theme park manager, but has had no other significant prior trauma or hand injury.  She uses her thumb all the time with work.  Past Medical History  Diagnosis Date  . Allergic rhinitis   . Anemia, iron deficiency   . Heavy menses   . Uterine fibroid   . Lipoma     on L arm    Past Surgical History  Procedure Laterality Date  . Cesarean section    . Tubal ligation    . Tonsillectomy    . Uterine ultrasound  06/1998    and endo biopsy- neg  . Pelvic u/s and transvaginal  04/27/2009    fibroids x 2  . Endometrial ablation      fibroids  . Cholecystectomy      11/07/11    History   Social History  . Marital Status: Married    Spouse Name: N/A    Number of Children: 3  . Years of Education: N/A   Occupational History  . Cosmotologist    Social History Main Topics  . Smoking status: Never Smoker   . Smokeless tobacco: Never Used  . Alcohol Use: No  . Drug Use: No  . Sexual Activity: None   Other Topics Concern  . None   Social History Narrative  . None    Family History  Problem Relation Age of Onset  . Diabetes Mother   . Hypertension Mother   . Hyperlipidemia Mother   . Cancer Mother     uterine cancer  . Diabetes Father   . Alcohol abuse Father   . Hypertension Sister   . Diabetes Sister     boarderline  . Alcohol abuse Brother   .  Hypertension Brother   . Diabetes Maternal Grandmother   . Alcohol abuse Paternal Grandfather   . Alcohol abuse Brother   . Alcohol abuse Brother   . Alcohol abuse Brother   . Alcohol abuse Sister   . Diabetes Sister     severe  . Colon cancer Neg Hx     Medications and Allergies reviewed  Review of Systems:    GEN: No fevers, chills. Nontoxic. Primarily MSK c/o today. MSK: Detailed in the HPI GI: tolerating PO intake without difficulty Neuro: No numbness, parasthesias, or tingling associated. Otherwise the pertinent positives of the ROS are noted above.   Physical Examination: BP 118/72  Pulse 79  Temp(Src) 98.3 F (36.8 C) (Oral)  Wt 207 lb 4 oz (94.008 kg)  SpO2 95%    GEN: WDWN, NAD, Non-toxic, Alert & Oriented x 3 HEENT: Atraumatic, Normocephalic.  Ears and Nose: No external deformity. EXTR: No clubbing/cyanosis/edema NEURO: Normal gait.  PSYCH: Normally interactive. Conversant. Not depressed or anxious appearing.  Calm demeanor.   R hand Ecchymosis or edema: neg ROM wrist/hand/digits:  full  Carpals, MCP's, digits: NT Distal Ulna and Radius: NT Ecchymosis or edema: neg No instability Cysts/nodules: neg Digit triggering: none at this point. Finkelstein's test: neg Snuffbox tenderness: neg Scaphoid tubercle: NT Resisted supination: NT Full composite fist, no malrotation Grip, all digits: 5/5 str DIPJT: NT PIP JT: NT MCP JT: NT  tenosynovitis at the volar thumb along sheath Axial load test: neg Phalen's: neg Tinel's: neg Atrophy: neg  Hand sensation: intact    Assessment and Plan: 1. Tenosynovitis R volar thumb sheath 2. Trigger thumb, small nodule   Put in thumb spica for 2-3 weeks, because I think most of symptoms from overuse. Occupation of hairdresser. No sign of triggering now  F/u 3-4 weeks  Thank you for having Victoria [name] see Victoria [name] in consultation.  Feel free to contact me with any questions.  Updated Medication List:    Medication List       This list is accurate as of: 07/17/13 11:59 PM.  Always use your most recent med list.               predniSONE 20 MG tablet  Commonly known as:  DELTASONE  2 po daily for 5 days, then 1 po daily for 4 days        Signed,  Draper Gallon T. Everado Pillsbury, MD, [place] at [facility] [address] [place], [place] [address] Phone: [phone] Fax: [phone]

## 2013-07-17 NOTE — Progress Notes (Signed)
Pre visit review using our clinic review tool, if applicable. No additional management support is needed unless otherwise documented below in the visit note. 

## 2013-07-24 ENCOUNTER — Telehealth: Payer: Self-pay

## 2013-07-24 DIAGNOSIS — M659 Synovitis and tenosynovitis, unspecified: Secondary | ICD-10-CM

## 2013-07-24 DIAGNOSIS — M65319 Trigger thumb, unspecified thumb: Secondary | ICD-10-CM

## 2013-07-24 NOTE — Telephone Encounter (Signed)
Patient notified as instructed by telephone. 

## 2013-07-24 NOTE — Telephone Encounter (Signed)
Pt left vm; pt said prednisone making pt very sick and pt request to stop prednisone.pt has been taking prednisone for 6 days and pt is taking prednisone 20 mg daily and has 3 more days to take med; pt said prednisone causing pt to have a feeling of a knot in mid chest similar to having heart burn with nausea and pt feels weak; pt said when she moves around too fast she sees blue dots for short period of time.  Pt said not having CP,SOB,dizziness or H/A. This is the first time pt has taken prednisone. Pt wants to know if has to take for 3 more days or can she stop Prednisone today. Pt request cb. Pt said thumb is not any better; still very sore and hurts worse to bend thumb. [facility]. Pt request cb.

## 2013-07-24 NOTE — Telephone Encounter (Signed)
Please have her stop the prednisone.   i would have anticipated her improving.   I am going to consult one of the hand surgeons for their opinion.  Owens Loffler, MD

## 2013-08-12 ENCOUNTER — Ambulatory Visit: Payer: PRIVATE HEALTH INSURANCE | Admitting: Podiatry

## 2014-04-14 ENCOUNTER — Encounter: Payer: Self-pay | Admitting: Internal Medicine

## 2014-04-14 ENCOUNTER — Ambulatory Visit (INDEPENDENT_AMBULATORY_CARE_PROVIDER_SITE_OTHER): Payer: No Typology Code available for payment source | Admitting: Internal Medicine

## 2014-04-14 VITALS — BP 146/72 | HR 77 | Temp 97.9°F | Wt 206.0 lb

## 2014-04-14 DIAGNOSIS — R300 Dysuria: Secondary | ICD-10-CM

## 2014-04-14 DIAGNOSIS — N390 Urinary tract infection, site not specified: Secondary | ICD-10-CM

## 2014-04-14 LAB — POCT URINALYSIS DIPSTICK
Bilirubin, UA: NEGATIVE
GLUCOSE UA: NEGATIVE
Ketones, UA: NEGATIVE
NITRITE UA: POSITIVE
PROTEIN UA: NEGATIVE
Spec Grav, UA: >=1.03
UROBILINOGEN UA: 4
pH, UA: 6

## 2014-04-14 MED ORDER — SULFAMETHOXAZOLE-TRIMETHOPRIM 800-160 MG PO TABS: 1.0000 | ORAL_TABLET | Freq: Two times a day (BID) | ORAL | Status: DC

## 2014-04-14 NOTE — Patient Instructions (Signed)

## 2014-04-14 NOTE — Assessment & Plan Note (Signed)
Symptoms for 4-5 days with some systemic symptoms (fever) Will treat with septra for full course

## 2014-04-14 NOTE — Progress Notes (Signed)
   Subjective:    Patient ID: Victoria [name], female    DOB: Sep 06, 1960, 53 y.o.   MRN: [id number]  HPI Started with urinary urgency about 4-5 days ago Bad dysuria Still with urgency but not increased frequency  No hematuria Did have fever the first 2 nights Got the azo and got short relief Tylenol for fever and ibuprofen for pain  No current outpatient prescriptions on file prior to visit.   No current facility-administered medications on file prior to visit.    Allergies  Allergen Reactions  . Codeine     REACTION: nausea and vomiting  . Doxycycline Nausea Only    Past Medical History  Diagnosis Date  . Allergic rhinitis   . Anemia, iron deficiency   . Heavy menses   . Uterine fibroid   . Lipoma     on L arm    Past Surgical History  Procedure Laterality Date  . Cesarean section    . Tubal ligation    . Tonsillectomy    . Uterine ultrasound  06/1998    and endo biopsy- neg  . Pelvic u/s and transvaginal  04/27/2009    fibroids x 2  . Endometrial ablation      fibroids  . Cholecystectomy      11/07/11    Family History  Problem Relation Age of Onset  . Diabetes Mother   . Hypertension Mother   . Hyperlipidemia Mother   . Cancer Mother     uterine cancer  . Diabetes Father   . Alcohol abuse Father   . Hypertension Sister   . Diabetes Sister     boarderline  . Alcohol abuse Brother   . Hypertension Brother   . Diabetes Maternal Grandmother   . Alcohol abuse Paternal Grandfather   . Alcohol abuse Brother   . Alcohol abuse Brother   . Alcohol abuse Brother   . Alcohol abuse Sister   . Diabetes Sister     severe  . Colon cancer Neg Hx     History   Social History  . Marital Status: Married    Spouse Name: N/A    Number of Children: 3  . Years of Education: N/A   Occupational History  . Cosmotologist    Social History Main Topics  . Smoking status: Never Smoker   . Smokeless tobacco: Never Used  . Alcohol Use: No  . Drug Use: No  .  Sexual Activity: Not on file   Other Topics Concern  . Not on file   Social History Narrative   Review of Systems No nausea or vomiting  No back pain No recent UTI--and this is worse    Objective:   Physical Exam  Constitutional: She appears well-developed and well-nourished. No distress.  Abdominal:  Mild generalized tenderness  Musculoskeletal:  No CVA tenderness          Assessment & Plan:

## 2014-04-14 NOTE — Progress Notes (Signed)
Pre visit review using our clinic review tool, if applicable. No additional management support is needed unless otherwise documented below in the visit note. 

## 2014-05-19 ENCOUNTER — Other Ambulatory Visit: Payer: Self-pay

## 2014-05-19 DIAGNOSIS — Z1231 Encounter for screening mammogram for malignant neoplasm of breast: Secondary | ICD-10-CM

## 2014-06-16 ENCOUNTER — Ambulatory Visit
Admission: RE | Admit: 2014-06-16 | Discharge: 2014-06-16 | Disposition: A | Discharge status: Home / Self Care | Payer: No Typology Code available for payment source | Source: Ambulatory Visit

## 2014-06-16 DIAGNOSIS — Z1231 Encounter for screening mammogram for malignant neoplasm of breast: Secondary | ICD-10-CM

## 2014-09-02 ENCOUNTER — Encounter: Payer: Self-pay | Admitting: Family Medicine

## 2014-09-02 ENCOUNTER — Ambulatory Visit (INDEPENDENT_AMBULATORY_CARE_PROVIDER_SITE_OTHER): Payer: No Typology Code available for payment source | Admitting: Family Medicine

## 2014-09-02 ENCOUNTER — Other Ambulatory Visit (HOSPITAL_COMMUNITY)
Admission: RE | Admit: 2014-09-02 | Discharge: 2014-09-02 | Disposition: A | Discharge status: Home / Self Care | Payer: No Typology Code available for payment source | Source: Ambulatory Visit | Attending: Family Medicine | Admitting: Family Medicine

## 2014-09-02 VITALS — BP 135/70 | HR 76 | Temp 98.6°F | Ht 63.5 in | Wt 206.5 lb

## 2014-09-02 DIAGNOSIS — Z1151 Encounter for screening for human papillomavirus (HPV): Secondary | ICD-10-CM | POA: Diagnosis present

## 2014-09-02 DIAGNOSIS — Z23 Encounter for immunization: Secondary | ICD-10-CM

## 2014-09-02 DIAGNOSIS — Z01411 Encounter for gynecological examination (general) (routine) with abnormal findings: Secondary | ICD-10-CM | POA: Diagnosis not present

## 2014-09-02 DIAGNOSIS — E669 Obesity, unspecified: Secondary | ICD-10-CM

## 2014-09-02 DIAGNOSIS — Z0000 Encounter for general adult medical examination without abnormal findings: Secondary | ICD-10-CM

## 2014-09-02 DIAGNOSIS — E785 Hyperlipidemia, unspecified: Secondary | ICD-10-CM

## 2014-09-02 DIAGNOSIS — Z01419 Encounter for gynecological examination (general) (routine) without abnormal findings: Secondary | ICD-10-CM

## 2014-09-02 LAB — TSH: TSH: 2.23 u[IU]/mL (ref 0.35–4.50)

## 2014-09-02 LAB — CBC WITH DIFFERENTIAL/PLATELET
BASOS PCT: 0.5 % (ref 0.0–3.0)
Basophils Absolute: 0 10*3/uL (ref 0.0–0.1)
Eosinophils Absolute: 0.1 10*3/uL (ref 0.0–0.7)
Eosinophils Relative: 0.9 % (ref 0.0–5.0)
HEMATOCRIT: 40.6 % (ref 36.0–46.0)
Hemoglobin: 13.8 g/dL (ref 12.0–15.0)
Lymphocytes Relative: 27 % (ref 12.0–46.0)
Lymphs Abs: 1.7 10*3/uL (ref 0.7–4.0)
MCHC: 34 g/dL (ref 30.0–36.0)
MCV: 81.6 fl (ref 78.0–100.0)
MONO ABS: 0.4 10*3/uL (ref 0.1–1.0)
MONOS PCT: 6 % (ref 3.0–12.0)
Neutro Abs: 4.1 10*3/uL (ref 1.4–7.7)
Neutrophils Relative %: 65.6 % (ref 43.0–77.0)
Platelets: 267 10*3/uL (ref 150.0–400.0)
RBC: 4.97 Mil/uL (ref 3.87–5.11)
RDW: 13.6 % (ref 11.5–15.5)
WBC: 6.3 10*3/uL (ref 4.0–10.5)

## 2014-09-02 LAB — COMPREHENSIVE METABOLIC PANEL
ALBUMIN: 4.3 g/dL (ref 3.5–5.2)
ALT: 31 U/L (ref 0–35)
AST: 27 U/L (ref 0–37)
Alkaline Phosphatase: 75 U/L (ref 39–117)
BUN: 8 mg/dL (ref 6–23)
CO2: 31 mEq/L (ref 19–32)
CREATININE: 0.69 mg/dL (ref 0.40–1.20)
Calcium: 9.3 mg/dL (ref 8.4–10.5)
Chloride: 103 mEq/L (ref 96–112)
GFR: 114.25 mL/min (ref >60.00)
Glucose, Bld: 96 mg/dL (ref 70–99)
Potassium: 3.7 mEq/L (ref 3.5–5.1)
Sodium: 138 mEq/L (ref 135–145)
TOTAL PROTEIN: 7.2 g/dL (ref 6.0–8.3)
Total Bilirubin: 0.4 mg/dL (ref 0.2–1.2)

## 2014-09-02 LAB — LIPID PANEL
CHOL/HDL RATIO: 5
Cholesterol: 210 mg/dL — ABNORMAL HIGH (ref 0–200)
HDL: 44.7 mg/dL (ref >39.00)
LDL CALC: 144 mg/dL — AB (ref 0–99)
NonHDL: 165.3
TRIGLYCERIDES: 108 mg/dL (ref 0.0–149.0)
VLDL: 21.6 mg/dL (ref 0.0–40.0)

## 2014-09-02 NOTE — Progress Notes (Signed)
Pre visit review using our clinic review tool, if applicable. No additional management support is needed unless otherwise documented below in the visit note. 

## 2014-09-02 NOTE — Progress Notes (Signed)
Subjective:    Patient ID: Victoria [name], female    DOB: 05-18-1960, 53 y.o.   MRN: [id number]  HPI Here for health maintenance exam and to review chronic medical problems    Doing ok overall   SIL died - lung cancer  Mother died - also lung cancer  Doing fine though with grief - her mother's birthday comes up soon  Gets corresp from hospice  Wt is stable bmi is 95 [facility]  Trying to do better with diet   bp is up today  Declines screening for Hep C and HIV   Not seeing gyn  Periods are over  Menopause- tolerating it - is hot  Endometrial ablations in the past - that stopped bleeding  Last pap 1/13 - due for 3 year pap   Mammogram 2/16 Normal  Self exam - no lumps   Flu vaccine = declines   Td 7/06  Will get that today   colonosc 3/13 - 10 year recall   Will get labs today   Trying to watch diet for hx of high cholesterol   Patient Active Problem List   Diagnosis Date Noted  . Encounter for routine gynecological examination 09/02/2014  . UTI (urinary tract infection) 04/14/2014  . Trigger thumb of right hand 06/11/2013  . Eye twitch 06/11/2013  . Nausea alone 08/06/2012  . Urine frequency 08/06/2012  . Abdominal pain, right upper quadrant 10/18/2011  . Special screening for malignant neoplasms, colon 06/08/2011  . Hyperlipidemia 06/08/2011  . Routine general medical examination at a health care facility 06/01/2011  . LIPOMA 12/30/2009  . ECCHYMOSES, SPONTANEOUS 12/30/2009  . OTHER SPECIFIED ANEMIAS 06/09/2009  . FIBROIDS, UTERUS 04/27/2009  . UTERINE ENLARGEMENT 04/22/2009  . Obesity 03/19/2007  . ALLERGIC RHINITIS 02/27/2007   Past Medical History  Diagnosis Date  . Allergic rhinitis   . Anemia, iron deficiency   . Heavy menses   . Uterine fibroid   . Lipoma     on L arm   Past Surgical History  Procedure Laterality Date  . Cesarean section    . Tubal ligation    . Tonsillectomy    . Uterine ultrasound  06/1998    and endo  biopsy- neg  . Pelvic u/s and transvaginal  04/27/2009    fibroids x 2  . Endometrial ablation      fibroids  . Cholecystectomy      11/07/11   History  Substance Use Topics  . Smoking status: Never Smoker   . Smokeless tobacco: Never Used  . Alcohol Use: No   Family History  Problem Relation Age of Onset  . Diabetes Mother   . Hypertension Mother   . Hyperlipidemia Mother   . Cancer Mother     uterine cancer  . Diabetes Father   . Alcohol abuse Father   . Hypertension Sister   . Diabetes Sister     boarderline  . Alcohol abuse Brother   . Hypertension Brother   . Diabetes Maternal Grandmother   . Alcohol abuse Paternal Grandfather   . Alcohol abuse Brother   . Alcohol abuse Brother   . Alcohol abuse Brother   . Alcohol abuse Sister   . Diabetes Sister     severe  . Colon cancer Neg Hx   . Lung cancer Mother     died 33   Allergies  Allergen Reactions  . Codeine     REACTION: nausea and vomiting  . Doxycycline  Nausea Only   No current outpatient prescriptions on file prior to visit.   No current facility-administered medications on file prior to visit.    Review of Systems Review of Systems  Constitutional: Negative for fever, appetite change, fatigue and unexpected weight change.  Eyes: Negative for pain and visual disturbance.  Respiratory: Negative for cough and shortness of breath.   Cardiovascular: Negative for cp or palpitations    Gastrointestinal: Negative for nausea, diarrhea and constipation.  Genitourinary: Negative for urgency and frequency.  Skin: Negative for pallor or rash   Neurological: Negative for weakness, light-headedness, numbness and headaches.  Hematological: Negative for adenopathy. Does not bruise/bleed easily.  Psychiatric/Behavioral: Negative for dysphoric mood. The patient is not nervous/anxious. Pos for stressors and grief         Objective:   Physical Exam  Constitutional: She appears well-developed and well-nourished.  No distress.  obese and well appearing   HENT:  Head: Normocephalic and atraumatic.  Right Ear: External ear normal.  Left Ear: External ear normal.  Mouth/Throat: Oropharynx is clear and moist.  Eyes: Conjunctivae and EOM are normal. Pupils are equal, round, and reactive to light. No scleral icterus.  Neck: Normal range of motion. Neck supple. No JVD present. Carotid bruit is not present. No thyromegaly present.  Cardiovascular: Normal rate, regular rhythm, normal heart sounds and intact distal pulses.  Exam reveals no gallop.   Pulmonary/Chest: Effort normal and breath sounds normal. No respiratory distress. She has no wheezes. She exhibits no tenderness.  Abdominal: Soft. Bowel sounds are normal. She exhibits no distension, no abdominal bruit and no mass. There is no tenderness.  Genitourinary: No breast swelling, tenderness, discharge or bleeding. There is no rash, tenderness or lesion on the right labia. There is no rash, tenderness or lesion on the left labia. Uterus is not enlarged and not tender. Cervix exhibits no motion tenderness and no friability. Right adnexum displays no mass, no tenderness and no fullness. Left adnexum displays no mass, no tenderness and no fullness. No bleeding in the vagina. No vaginal discharge found.  Breast exam: No mass, nodules, thickening, tenderness, bulging, retraction, inflamation, nipple discharge or skin changes noted.  No axillary or clavicular LA.      Musculoskeletal: Normal range of motion. She exhibits no edema or tenderness.  Lymphadenopathy:    She has no cervical adenopathy.  Neurological: She is alert. She has normal reflexes. No cranial nerve deficit. She exhibits normal muscle tone. Coordination normal.  Skin: Skin is warm and dry. No rash noted. No erythema. No pallor.  Psychiatric: She has a normal mood and affect.          Assessment & Plan:   Problem List Items Addressed This Visit      Other   Encounter for routine  gynecological examination    Exam and pap done  Menopausal  No bleeding since endometrial ablation       Relevant Orders   Cytology - PAP   Hyperlipidemia   Obesity    Discussed how this problem influences overall health and the risks it imposes  Reviewed plan for weight loss with lower calorie diet (via better food choices and also portion control or program like weight watchers) and exercise building up to or more than 30 minutes 5 days per week including some aerobic activity         Routine general medical examination at a health care facility - Primary    Reviewed health habits including diet and exercise  and skin cancer prevention Reviewed appropriate screening tests for age  Also reviewed health mt list, fam hx and immunization status , as well as social and family history   Labs today Tdap today        Relevant Orders   CBC with Differential/Platelet (Completed)   Comprehensive metabolic panel (Completed)   TSH (Completed)   Lipid panel (Completed)    Other Visit Diagnoses    Need for Tdap vaccination        Relevant Orders    Tdap vaccine greater than or equal to 7yo IM (Completed)

## 2014-09-02 NOTE — Patient Instructions (Addendum)
Labs today  Tdap vaccine today  Pap /gyn exam today   Take care of yourself  Try to watch sodium in diet

## 2014-09-03 ENCOUNTER — Encounter: Payer: Self-pay | Admitting: *Deleted

## 2014-09-03 LAB — CYTOLOGY - PAP

## 2014-09-04 NOTE — Assessment & Plan Note (Signed)
Exam and pap done  Menopausal  No bleeding since endometrial ablation

## 2014-09-04 NOTE — Assessment & Plan Note (Signed)
Discussed how this problem influences overall health and the risks it imposes  Reviewed plan for weight loss with lower calorie diet (via better food choices and also portion control or program like weight watchers) and exercise building up to or more than 30 minutes 5 days per week including some aerobic activity    

## 2014-09-04 NOTE — Assessment & Plan Note (Signed)
Reviewed health habits including diet and exercise and skin cancer prevention Reviewed appropriate screening tests for age  Also reviewed health mt list, fam hx and immunization status , as well as social and family history   Labs today Tdap today

## 2014-09-07 NOTE — Op Note (Signed)
PATIENT NAME:  TANZANIA, BASHAM MR#:  [id number] DATE OF BIRTH:  February 10, 1961  DATE OF PROCEDURE:  11/07/2011  PREOPERATIVE DIAGNOSIS: Chronic cholecystitis and cholelithiasis.   POSTOPERATIVE DIAGNOSIS: Chronic cholecystitis and cholelithiasis.   OPERATION: Laparoscopy, cholecystectomy with intraoperative cholangiogram.   SURGEON: Mckinley Jewel, MD   ANESTHESIA: General.   COMPLICATIONS: None.   ESTIMATED BLOOD LOSS: Less than 25 mL.   DRAINS: None.   DESCRIPTION OF PROCEDURE: The patient was put to sleep in the supine position on the operating table. The abdomen was prepped and draped out as a sterile field. A small incision was made above the level of the umbilicus, and the fascia was lifted up and a Veress needle and the InnerDyne sleeve positioned in the peritoneal cavity. This was verified with the hanging drop method, and pneumoperitoneum was obtained. A 10 mm port was placed. The camera was then placed in the peritoneal cavity with good visualization. Epigastric and two lateral 5 mm ports were placed. The gallbladder showed evidence of chronic cholecystitis with thickening in its wall and a few flimsy adhesions but no acute changes. After this was satisfactorily exposed, and the Hartmann's pouch was lifted up, the common bile duct was easily identified and appeared to be normal size. The cystic duct was then dissected free. The Kumar clamp and catheter were positioned. A cholangiogram was performed which showed good filling of the bile duct both proximal and distal with no evidence of stone or obstruction to flow in the duct. The catheter was used to decompress the gallbladder minimally and then removed. The cystic duct was hemoclipped and cut. The cystic artery was identified, freed, hemoclipped and cut. The gallbladder was dissected free from the bed using cautery for control of bleeding. A small amount of fluid was used to irrigate out the right upper quadrant and suctioned out. With the 5 mm  scope in the epigastric port, the gallbladder was brought out through the umbilical port site. It was noted to contain multiple stones up to about 5 mm in size. After this was removed, a suture passer was used to close the fascial opening at the umbilicus with 0 Vicryl stitch. The remaining ports were removed after release of pneumoperitoneum. The skin incisions were closed with subcuticular 4-0 Vicryl reinforced with Steri-Strips. A dry sterile dressing was placed. The patient tolerated the procedure well. No immediate problems were encountered. She was subsequently extubated and returned to the recovery room  in stable condition.  ____________________________ S.Robinette Haines, MD sgs:cbb D: 11/07/2011 10:58:12 ET T: 11/07/2011 11:20:00 ET JOB#: [id number]  cc: S.G. Jamal Collin, MD, <Dictator> [facility] Robinette Haines MD ELECTRONICALLY SIGNED 11/07/2011 16:03

## 2014-11-05 ENCOUNTER — Emergency Department
Admission: EM | Admit: 2014-11-05 | Discharge: 2014-11-05 | Disposition: A | Discharge status: Home / Self Care | Payer: No Typology Code available for payment source | Attending: Student | Admitting: Student

## 2014-11-05 ENCOUNTER — Emergency Department: Payer: No Typology Code available for payment source

## 2014-11-05 DIAGNOSIS — X58XXXS Exposure to other specified factors, sequela: Secondary | ICD-10-CM | POA: Diagnosis not present

## 2014-11-05 DIAGNOSIS — M25562 Pain in left knee: Secondary | ICD-10-CM | POA: Diagnosis present

## 2014-11-05 DIAGNOSIS — Z79899 Other long term (current) drug therapy: Secondary | ICD-10-CM | POA: Diagnosis not present

## 2014-11-05 DIAGNOSIS — S86912S Strain of unspecified muscle(s) and tendon(s) at lower leg level, left leg, sequela: Secondary | ICD-10-CM | POA: Diagnosis not present

## 2014-11-05 DIAGNOSIS — Z791 Long term (current) use of non-steroidal anti-inflammatories (NSAID): Secondary | ICD-10-CM | POA: Diagnosis not present

## 2014-11-05 MED ORDER — PREDNISONE 10 MG PO TABS
ORAL_TABLET | ORAL | Status: DC
Start: 2014-11-05 — End: 2015-07-21

## 2014-11-05 MED ORDER — KETOROLAC TROMETHAMINE 60 MG/2ML IM SOLN
60.0000 mg | Freq: Once | INTRAMUSCULAR | Status: AC
  Administered 2014-11-05: 60 mg via INTRAMUSCULAR

## 2014-11-05 MED ORDER — HYDROCODONE-ACETAMINOPHEN 5-325 MG PO TABS: 1.0000 | ORAL_TABLET | ORAL | Status: DC | PRN

## 2014-11-05 MED ORDER — PROMETHAZINE HCL 25 MG PO TABS: 25.0000 mg | ORAL_TABLET | Freq: Four times a day (QID) | ORAL | Status: DC | PRN

## 2014-11-05 MED ORDER — KETOROLAC TROMETHAMINE 60 MG/2ML IM SOLN
INTRAMUSCULAR | Status: AC
  Administered 2014-11-05: 60 mg via INTRAMUSCULAR
  Filled 2014-11-05: qty 2

## 2014-11-05 NOTE — ED Provider Notes (Signed)
[facility] Emergency Department Provider Note  ____________________________________________  Time seen: Approximately 4:12 PM  I have reviewed the triage vital signs and the nursing notes.   HISTORY  Chief Complaint Knee Pain    HPI Victoria [name] is a 54 y.o. female who presents to emergency room with complaints of left knee pain. Patient recently saw orthopedics for same however she heard a pop this morning. States having difficulty standing and bearing weight on her knee. The orthopedic doctor states she has a torn cartilage. Describes the pain as about a 7 out of 10.   Past Medical History  Diagnosis Date  . Allergic rhinitis   . Anemia, iron deficiency   . Heavy menses   . Uterine fibroid   . Lipoma     on L arm    Patient Active Problem List   Diagnosis Date Noted  . Encounter for routine gynecological examination 09/02/2014  . UTI (urinary tract infection) 04/14/2014  . Trigger thumb of right hand 06/11/2013  . Eye twitch 06/11/2013  . Nausea alone 08/06/2012  . Urine frequency 08/06/2012  . Abdominal pain, right upper quadrant 10/18/2011  . Special screening for malignant neoplasms, colon 06/08/2011  . Hyperlipidemia 06/08/2011  . Routine general medical examination at a health care facility 06/01/2011  . LIPOMA 12/30/2009  . ECCHYMOSES, SPONTANEOUS 12/30/2009  . OTHER SPECIFIED ANEMIAS 06/09/2009  . FIBROIDS, UTERUS 04/27/2009  . UTERINE ENLARGEMENT 04/22/2009  . Obesity 03/19/2007  . ALLERGIC RHINITIS 02/27/2007    Past Surgical History  Procedure Laterality Date  . Cesarean section    . Tubal ligation    . Tonsillectomy    . Uterine ultrasound  06/1998    and endo biopsy- neg  . Pelvic u/s and transvaginal  04/27/2009    fibroids x 2  . Endometrial ablation      fibroids  . Cholecystectomy      11/07/11    Current Outpatient Rx  Name  Route  Sig  Dispense  Refill  . meloxicam (MOBIC) 15 MG tablet   Oral   Take 15  mg by mouth daily.         Marland Kitchen EVENING PRIMROSE OIL PO   Oral   Take by mouth daily.         Marland Kitchen HYDROcodone-acetaminophen (NORCO) 5-325 MG per tablet   Oral   Take 1-2 tablets by mouth every 4 (four) hours as needed for moderate pain.   15 tablet   0   . predniSONE (DELTASONE) 10 MG tablet      Take 5 tablets daily for 5 days.   25 tablet   0   . promethazine (PHENERGAN) 25 MG tablet   Oral   Take 1 tablet (25 mg total) by mouth every 6 (six) hours as needed for nausea or vomiting.   15 tablet   0     Allergies Codeine and Doxycycline  Family History  Problem Relation Age of Onset  . Diabetes Mother   . Hypertension Mother   . Hyperlipidemia Mother   . Cancer Mother     uterine cancer  . Diabetes Father   . Alcohol abuse Father   . Hypertension Sister   . Diabetes Sister     boarderline  . Alcohol abuse Brother   . Hypertension Brother   . Diabetes Maternal Grandmother   . Alcohol abuse Paternal Grandfather   . Alcohol abuse Brother   . Alcohol abuse Brother   . Alcohol abuse  Brother   . Alcohol abuse Sister   . Diabetes Sister     severe  . Colon cancer Neg Hx   . Lung cancer Mother     died 20    Social History History  Substance Use Topics  . Smoking status: Never Smoker   . Smokeless tobacco: Never Used  . Alcohol Use: No    Review of Systems Constitutional: No fever/chills Eyes: No visual changes. ENT: No sore throat. Cardiovascular: Denies chest pain. Respiratory: Denies shortness of breath. Gastrointestinal: No abdominal pain.  No nausea, no vomiting.  No diarrhea.  No constipation. Genitourinary: Negative for dysuria. Musculoskeletal: Positive for left knee pain Skin: Negative for rash. Neurological: Negative for headaches, focal weakness or numbness.  10-point ROS otherwise negative.  ____________________________________________   PHYSICAL EXAM:  VITAL SIGNS: ED Triage Vitals  Enc Vitals Group     BP 11/05/14 1509  143/76 mmHg     Pulse Rate 11/05/14 1509 79     Resp 11/05/14 1509 16     Temp 11/05/14 1509 98.4 F (36.9 C)     Temp Source 11/05/14 1509 Oral     SpO2 11/05/14 1509 97 %     Weight 11/05/14 1509 208 lb (94.348 kg)     Height 11/05/14 1509 5\' 3"  (1.6 m)     Head Cir --      Peak Flow --      Pain Score 11/05/14 1510 0     Pain Loc --      Pain Edu? --      Excl. in [place]? --     Constitutional: Alert and oriented. Well appearing and in no acute distress. Laying in bed comfortably.   Cardiovascular: Normal rate, regular rhythm. Grossly normal heart sounds.  Good peripheral circulation. Respiratory: Normal respiratory effort.  No retractions. Lungs CTAB. Gastrointestinal: Soft and nontender. No distention. No abdominal bruits. No CVA tenderness. Musculoskeletal: No joint effusions. Full range of motion with flexion and extension. Increased pain with lateral stress test. Neurologic:  Normal speech and language. No gross focal neurologic deficits are appreciated. Speech is normal. No gait instability. Skin:  Skin is warm, dry and intact. No rash noted. Psychiatric: Mood and affect are normal. Speech and behavior are normal.  ____________________________________________   LABS (all labs ordered are listed, but only abnormal results are displayed)  Labs Reviewed - No data to display ____________________________________________  EKG  Not applicable ____________________________________________  RADIOLOGY  Interpreted by radiologist, reviewed by myself. Negative for fracture. ____________________________________________   PROCEDURES  Procedure(s) performed: None  Critical Care performed: No  ____________________________________________   INITIAL IMPRESSION / ASSESSMENT AND PLAN / ED COURSE  Pertinent labs & imaging results that were available during my care of the patient were reviewed by me and considered in my medical decision making (see chart for details).  Acute  left knee strain. Currently followed by orthopedics. Will issue of knee immobilizer and crutches date and ambulation. Given for hydrocodone 2 days. Patient to continue with Mobic and follow-up with orthopedics as directed. ____________________________________________   FINAL CLINICAL IMPRESSION(S) / ED DIAGNOSES  Final diagnoses:  Knee strain, left, sequela      Arlyss Repress, PA-C 11/06/14 0127  Joanne Gavel, MD 11/07/14 0004

## 2014-11-05 NOTE — ED Notes (Signed)
Patient presents to the ED for left knee pain that became much worse about noon today.  Patient reports pain x 1 month in knee.  Patient states she now cannot put any weight on her knee.  Patient reports hearing a pop in her knee today.

## 2015-05-19 ENCOUNTER — Other Ambulatory Visit: Payer: Self-pay

## 2015-05-19 DIAGNOSIS — Z1231 Encounter for screening mammogram for malignant neoplasm of breast: Secondary | ICD-10-CM

## 2015-06-22 ENCOUNTER — Ambulatory Visit
Admission: RE | Admit: 2015-06-22 | Discharge: 2015-06-22 | Disposition: A | Discharge status: Home / Self Care | Payer: No Typology Code available for payment source | Source: Ambulatory Visit

## 2015-06-22 DIAGNOSIS — Z1231 Encounter for screening mammogram for malignant neoplasm of breast: Secondary | ICD-10-CM

## 2015-06-24 ENCOUNTER — Ambulatory Visit: Payer: PRIVATE HEALTH INSURANCE | Admitting: Podiatry

## 2015-07-21 ENCOUNTER — Encounter: Payer: Self-pay | Admitting: Podiatry

## 2015-07-21 ENCOUNTER — Ambulatory Visit: Payer: Self-pay

## 2015-07-21 ENCOUNTER — Ambulatory Visit (INDEPENDENT_AMBULATORY_CARE_PROVIDER_SITE_OTHER): Payer: No Typology Code available for payment source

## 2015-07-21 ENCOUNTER — Ambulatory Visit (INDEPENDENT_AMBULATORY_CARE_PROVIDER_SITE_OTHER): Payer: No Typology Code available for payment source | Admitting: Podiatry

## 2015-07-21 VITALS — BP 118/69 | HR 80 | Resp 16

## 2015-07-21 DIAGNOSIS — M79671 Pain in right foot: Secondary | ICD-10-CM

## 2015-07-21 DIAGNOSIS — M79672 Pain in left foot: Secondary | ICD-10-CM

## 2015-07-21 DIAGNOSIS — M722 Plantar fascial fibromatosis: Secondary | ICD-10-CM | POA: Diagnosis not present

## 2015-07-21 DIAGNOSIS — M779 Enthesopathy, unspecified: Secondary | ICD-10-CM | POA: Diagnosis not present

## 2015-07-21 DIAGNOSIS — M21619 Bunion of unspecified foot: Secondary | ICD-10-CM

## 2015-07-21 MED ORDER — DICLOFENAC SODIUM 75 MG PO TBEC: 75.0000 mg | DELAYED_RELEASE_TABLET | Freq: Two times a day (BID) | ORAL | Status: DC

## 2015-07-21 MED ORDER — TRIAMCINOLONE ACETONIDE 10 MG/ML IJ SUSP
10.0000 mg | Freq: Once | INTRAMUSCULAR | Status: AC
  Administered 2015-07-21: 10 mg

## 2015-07-21 NOTE — Patient Instructions (Signed)

## 2015-07-21 NOTE — Progress Notes (Signed)
Subjective:     Patient ID: Victoria [name], female   DOB: [birth year], 55 y.o.   MRN: [id number]  HPI patient presents stating I'm having a lot of pain in the bottom of my left heel and in my right forefoot. I also have a bunion deformity right over left which occasionally bothers me especially with tighter shoes and this is been getting worse gradually   Review of Systems     Objective:   Physical Exam Neurovascular status found to be intact muscle strength adequate range of motion within normal limits with patient noted to have inflammation and pain second metatarsophalangeal joint right mild hyperostosis medial aspect first metatarsal head right and exquisite discomfort plantar aspect left heel insertional point tendon into the calcaneus. Patient also does have structural deformity right with redness around the first metatarsal head    Assessment:     Capsulitis second MPJ right with structural bunion deformity right and plantar fasciitis left.    Plan:     H&P and x-rays reviewed with patient. Today I did a proximal nerve block right I then aspirated the second MPJ getting out of small amount of clear fluid and injected with a quarter cc dexamethasone Kenalog after first discussing the risk of capsular injection. For the left I did inject the plantar fascia 3 mg Kenalog 5 mg Xylocaine and then educated her on bunion deformity and considerations long-term for correction. Dispensed ankle fascial brace for the left arch and padding for the right forefoot and reappoint again in 2 weeks. Placed on diclofenac 75 mg twice a day  X-ray report indicated structural bunion deformity right over left with mild distention of the second MPJ left and spur plantar bilateral

## 2015-08-03 ENCOUNTER — Ambulatory Visit: Payer: PRIVATE HEALTH INSURANCE | Admitting: Podiatry

## 2015-11-24 ENCOUNTER — Ambulatory Visit (INDEPENDENT_AMBULATORY_CARE_PROVIDER_SITE_OTHER): Payer: No Typology Code available for payment source | Admitting: Podiatry

## 2015-11-24 ENCOUNTER — Encounter: Payer: Self-pay | Admitting: Podiatry

## 2015-11-24 DIAGNOSIS — M722 Plantar fascial fibromatosis: Secondary | ICD-10-CM | POA: Diagnosis not present

## 2015-11-24 DIAGNOSIS — M779 Enthesopathy, unspecified: Secondary | ICD-10-CM

## 2015-11-24 MED ORDER — TRIAMCINOLONE ACETONIDE 10 MG/ML IJ SUSP
10.0000 mg | Freq: Once | INTRAMUSCULAR | Status: AC
  Administered 2015-11-24: 10 mg

## 2015-11-24 NOTE — Progress Notes (Signed)
Subjective:     Patient ID: Victoria [name], female   DOB: 01-Apr-1961, 55 y.o.   MRN: [id number]  HPI patient presents with quite a bit of pain in the plantar aspect of the left heel and mild in the forefoot also occurring with discomfort in the ankle. States it's been going on and seems to be getting gradually more of an issue for her   Review of Systems     Objective:   Physical Exam Neurovascular status intact muscle strength adequate with continued discomfort plantar heel left and into the left lateral foot with inflammation    Assessment:     Continued fasciitis with compensatory pain present    Plan:     Reviewed condition and reinjected the tendon 3 mg Kenalog 5 mg Xylocaine and advised on physical therapy and dispensed a air fracture walker for immobilization with instructions on usage today. Reappoint to recheck

## 2015-12-02 ENCOUNTER — Encounter: Payer: Self-pay | Admitting: Family Medicine

## 2015-12-02 ENCOUNTER — Ambulatory Visit (INDEPENDENT_AMBULATORY_CARE_PROVIDER_SITE_OTHER): Payer: No Typology Code available for payment source | Admitting: Family Medicine

## 2015-12-02 VITALS — BP 128/78 | HR 96 | Temp 98.9°F | Ht 63.5 in | Wt 200.5 lb

## 2015-12-02 DIAGNOSIS — R300 Dysuria: Secondary | ICD-10-CM

## 2015-12-02 DIAGNOSIS — N390 Urinary tract infection, site not specified: Secondary | ICD-10-CM | POA: Diagnosis not present

## 2015-12-02 LAB — POC URINALSYSI DIPSTICK (AUTOMATED)
Bilirubin, UA: NEGATIVE
GLUCOSE UA: NEGATIVE
Ketones, UA: NEGATIVE
NITRITE UA: NEGATIVE
PH UA: 6
Protein, UA: 30
Spec Grav, UA: 1.02
UROBILINOGEN UA: 1

## 2015-12-02 MED ORDER — CIPROFLOXACIN HCL 250 MG PO TABS: 250.0000 mg | ORAL_TABLET | Freq: Two times a day (BID) | ORAL | Status: DC

## 2015-12-02 NOTE — Patient Instructions (Signed)
You have a uti  Drink lots of water  Take cipro as directed  If worse or not improving in several days let me know We will update you when culture comes back

## 2015-12-02 NOTE — Progress Notes (Signed)
Subjective:    Patient ID: Victoria [name], female    DOB: 07-17-1960, 55 y.o.   MRN: [id number]  HPI Here today for urinary symptoms   Started with urgency/frequency - gets to the bathroom and not much volume  Hurts to urinate- just after she goes Also bladder discomfort No blood in urine  Last night had chills -did not take anything   Results for orders placed or performed in visit on 12/02/15  POCT Urinalysis Dipstick (Automated)  Result Value Ref Range   Color, UA Amber    Clarity, UA Clear    Glucose, UA Negative    Bilirubin, UA Negative    Ketones, UA Negative    Spec Grav, UA 1.020    Blood, UA Large (200 Ery/uL)    pH, UA 6.0    Protein, UA 30 mg/dL    Urobilinogen, UA 1.0    Nitrite, UA Negative    Leukocytes, UA large (3+) (A) Negative       Chemistry      Component Value Date/Time   NA 138 09/02/2014 1119   K 3.7 09/02/2014 1119   CL 103 09/02/2014 1119   CO2 31 09/02/2014 1119   BUN 8 09/02/2014 1119   CREATININE 0.69 09/02/2014 1119      Component Value Date/Time   CALCIUM 9.3 09/02/2014 1119   ALKPHOS 75 09/02/2014 1119   AST 27 09/02/2014 1119   ALT 31 09/02/2014 1119   BILITOT 0.4 09/02/2014 1119       Patient Active Problem List   Diagnosis Date Noted  . Urinary tract infection, site not specified 12/02/2015  . Encounter for routine gynecological examination 09/02/2014  . Trigger thumb of right hand 06/11/2013  . Eye twitch 06/11/2013  . Nausea alone 08/06/2012  . Urine frequency 08/06/2012  . Abdominal pain, right upper quadrant 10/18/2011  . Special screening for malignant neoplasms, colon 06/08/2011  . Hyperlipidemia 06/08/2011  . Routine general medical examination at a health care facility 06/01/2011  . LIPOMA 12/30/2009  . ECCHYMOSES, SPONTANEOUS 12/30/2009  . OTHER SPECIFIED ANEMIAS 06/09/2009  . FIBROIDS, UTERUS 04/27/2009  . Obesity 03/19/2007  . ALLERGIC RHINITIS 02/27/2007   Past Medical History  Diagnosis Date  .  Allergic rhinitis   . Anemia, iron deficiency   . Heavy menses   . Uterine fibroid   . Lipoma     on L arm   Past Surgical History  Procedure Laterality Date  . Cesarean section    . Tubal ligation    . Tonsillectomy    . Uterine ultrasound  06/1998    and endo biopsy- neg  . Pelvic u/s and transvaginal  04/27/2009    fibroids x 2  . Endometrial ablation      fibroids  . Cholecystectomy      11/07/11   Social History  Substance Use Topics  . Smoking status: Never Smoker   . Smokeless tobacco: Never Used  . Alcohol Use: No   Family History  Problem Relation Age of Onset  . Diabetes Mother   . Hypertension Mother   . Hyperlipidemia Mother   . Cancer Mother     uterine cancer  . Diabetes Father   . Alcohol abuse Father   . Hypertension Sister   . Diabetes Sister     boarderline  . Alcohol abuse Brother   . Hypertension Brother   . Diabetes Maternal Grandmother   . Alcohol abuse Paternal Grandfather   . Alcohol abuse Brother   .  Alcohol abuse Brother   . Alcohol abuse Brother   . Alcohol abuse Sister   . Diabetes Sister     severe  . Colon cancer Neg Hx   . Lung cancer Mother     died 22   Allergies  Allergen Reactions  . Codeine     REACTION: nausea and vomiting  . Doxycycline Nausea Only   Current Outpatient Prescriptions on File Prior to Visit  Medication Sig Dispense Refill  . diclofenac (VOLTAREN) 75 MG EC tablet Take 1 tablet (75 mg total) by mouth 2 (two) times daily. 50 tablet 2   No current facility-administered medications on file prior to visit.    Review of Systems  Constitutional: Positive for fatigue. Negative for fever, activity change and appetite change.  HENT: Negative for congestion and sore throat.   Eyes: Negative for itching and visual disturbance.  Respiratory: Negative for cough and shortness of breath.   Cardiovascular: Negative for leg swelling.  Gastrointestinal: Negative for nausea, abdominal pain, diarrhea, constipation  and abdominal distention.  Endocrine: Negative for cold intolerance and polydipsia.  Genitourinary: Positive for dysuria, urgency and frequency. Negative for hematuria, flank pain and difficulty urinating.  Musculoskeletal: Negative for myalgias.  Skin: Negative for rash.  Allergic/Immunologic: Negative for immunocompromised state.  Neurological: Negative for dizziness and weakness.  Hematological: Negative for adenopathy.       Objective:   Physical Exam  Constitutional: She appears well-developed and well-nourished. No distress.  obese and well appearing   HENT:  Head: Normocephalic and atraumatic.  Eyes: Conjunctivae and EOM are normal. Pupils are equal, round, and reactive to light.  Neck: Normal range of motion. Neck supple.  Cardiovascular: Normal rate, regular rhythm and normal heart sounds.   Pulmonary/Chest: Effort normal and breath sounds normal.  Abdominal: Soft. Bowel sounds are normal. She exhibits no distension. There is tenderness. There is no rebound.  No cva tenderness  Mild suprapubic tenderness  Musculoskeletal: She exhibits no edema.  Lymphadenopathy:    She has no cervical adenopathy.  Neurological: She is alert.  Skin: No rash noted.  Psychiatric: She has a normal mood and affect.          Assessment & Plan:   Problem List Items Addressed This Visit      Genitourinary   Urinary tract infection, site not specified - Primary    Cystitis without hematuria  Enc water intake Px cipro 250 bid for 5 d  cx pending  Handout on uti and how to prevent them  Update if worse or no imp in 2-3 d      Relevant Orders   Urine culture    Other Visit Diagnoses    Dysuria        Relevant Orders    POCT Urinalysis Dipstick (Automated) (Completed)

## 2015-12-02 NOTE — Assessment & Plan Note (Signed)
Cystitis without hematuria  Enc water intake Px cipro 250 bid for 5 d  cx pending  Handout on uti and how to prevent them  Update if worse or no imp in 2-3 d

## 2015-12-02 NOTE — Progress Notes (Signed)
Pre visit review using our clinic review tool, if applicable. No additional management support is needed unless otherwise documented below in the visit note. 

## 2015-12-05 LAB — URINE CULTURE

## 2015-12-21 ENCOUNTER — Encounter: Payer: Self-pay | Admitting: Podiatry

## 2015-12-21 ENCOUNTER — Ambulatory Visit (INDEPENDENT_AMBULATORY_CARE_PROVIDER_SITE_OTHER): Payer: No Typology Code available for payment source | Admitting: Podiatry

## 2015-12-21 DIAGNOSIS — M722 Plantar fascial fibromatosis: Secondary | ICD-10-CM | POA: Diagnosis not present

## 2015-12-22 NOTE — Progress Notes (Signed)
Subjective:     Patient ID: Victoria [name], female   DOB: 10-30-1960, 55 y.o.   MRN: [id number]  HPI patient states my heel is feeling a lot better with minimal discomfort noted and able to walk distances   Review of Systems     Objective:   Physical Exam Neurovascular status intact muscle strength adequate with significant diminishment of discomfort plantar aspect left heel at the insertional point of the tendon into the calcaneus    Assessment:     Plantar fasciitis left improving but still present    Plan:     Advised on physical therapy anti-inflammatories and supportive shoes. Patient will be seen back for [name] to recheck again in the next several weeks and was given all instructions that if any worsening of situation were to occur to come in immediately

## 2016-02-11 ENCOUNTER — Telehealth: Payer: Self-pay | Admitting: Family Medicine

## 2016-02-11 DIAGNOSIS — Z0000 Encounter for general adult medical examination without abnormal findings: Secondary | ICD-10-CM

## 2016-02-11 NOTE — Telephone Encounter (Signed)
-----   Message from [name] sent at 02/09/2016  4:02 PM EDT ----- Regarding: Lab orders for Monday, 10.2.17 Patient is scheduled for CPX labs, please order future labs, Thanks , Karna Christmas

## 2016-02-15 ENCOUNTER — Other Ambulatory Visit (INDEPENDENT_AMBULATORY_CARE_PROVIDER_SITE_OTHER): Payer: No Typology Code available for payment source

## 2016-02-15 DIAGNOSIS — Z0000 Encounter for general adult medical examination without abnormal findings: Secondary | ICD-10-CM | POA: Diagnosis not present

## 2016-02-15 LAB — CBC WITH DIFFERENTIAL/PLATELET
BASOS ABS: 0 10*3/uL (ref 0.0–0.1)
Basophils Relative: 0.4 % (ref 0.0–3.0)
EOS ABS: 0 10*3/uL (ref 0.0–0.7)
EOS PCT: 0.7 % (ref 0.0–5.0)
HCT: 40.8 % (ref 36.0–46.0)
HEMOGLOBIN: 13.9 g/dL (ref 12.0–15.0)
Lymphocytes Relative: 25.8 % (ref 12.0–46.0)
Lymphs Abs: 1.8 10*3/uL (ref 0.7–4.0)
MCHC: 34 g/dL (ref 30.0–36.0)
MCV: 82.6 fl (ref 78.0–100.0)
MONO ABS: 0.4 10*3/uL (ref 0.1–1.0)
Monocytes Relative: 5.1 % (ref 3.0–12.0)
Neutro Abs: 4.7 10*3/uL (ref 1.4–7.7)
Neutrophils Relative %: 68 % (ref 43.0–77.0)
Platelets: 281 10*3/uL (ref 150.0–400.0)
RBC: 4.95 Mil/uL (ref 3.87–5.11)
RDW: 13.3 % (ref 11.5–15.5)
WBC: 6.9 10*3/uL (ref 4.0–10.5)

## 2016-02-15 LAB — COMPREHENSIVE METABOLIC PANEL
ALBUMIN: 4.1 g/dL (ref 3.5–5.2)
ALT: 25 U/L (ref 0–35)
AST: 19 U/L (ref 0–37)
Alkaline Phosphatase: 70 U/L (ref 39–117)
BUN: 9 mg/dL (ref 6–23)
CHLORIDE: 104 meq/L (ref 96–112)
CO2: 28 meq/L (ref 19–32)
CREATININE: 0.67 mg/dL (ref 0.40–1.20)
Calcium: 9.1 mg/dL (ref 8.4–10.5)
GFR: 117.55 mL/min (ref >60.00)
Glucose, Bld: 105 mg/dL — ABNORMAL HIGH (ref 70–99)
Potassium: 3.7 mEq/L (ref 3.5–5.1)
SODIUM: 139 meq/L (ref 135–145)
TOTAL PROTEIN: 7.3 g/dL (ref 6.0–8.3)
Total Bilirubin: 0.5 mg/dL (ref 0.2–1.2)

## 2016-02-15 LAB — LIPID PANEL
CHOL/HDL RATIO: 5
CHOLESTEROL: 224 mg/dL — AB (ref 0–200)
HDL: 45.1 mg/dL (ref >39.00)
LDL CALC: 156 mg/dL — AB (ref 0–99)
NonHDL: 178.97
Triglycerides: 114 mg/dL (ref 0.0–149.0)
VLDL: 22.8 mg/dL (ref 0.0–40.0)

## 2016-02-15 LAB — TSH: TSH: 3.11 u[IU]/mL (ref 0.35–4.50)

## 2016-02-22 ENCOUNTER — Encounter: Payer: Self-pay | Admitting: Family Medicine

## 2016-02-22 ENCOUNTER — Ambulatory Visit (INDEPENDENT_AMBULATORY_CARE_PROVIDER_SITE_OTHER): Payer: No Typology Code available for payment source | Admitting: Family Medicine

## 2016-02-22 VITALS — BP 106/68 | HR 77 | Temp 98.6°F | Ht 63.0 in | Wt 202.5 lb

## 2016-02-22 DIAGNOSIS — R739 Hyperglycemia, unspecified: Secondary | ICD-10-CM

## 2016-02-22 DIAGNOSIS — E6609 Other obesity due to excess calories: Secondary | ICD-10-CM

## 2016-02-22 DIAGNOSIS — F410 Panic disorder [episodic paroxysmal anxiety] without agoraphobia: Secondary | ICD-10-CM | POA: Diagnosis not present

## 2016-02-22 DIAGNOSIS — E7800 Pure hypercholesterolemia, unspecified: Secondary | ICD-10-CM | POA: Diagnosis not present

## 2016-02-22 DIAGNOSIS — R110 Nausea: Secondary | ICD-10-CM

## 2016-02-22 DIAGNOSIS — Z6835 Body mass index (BMI) 35.0-35.9, adult: Secondary | ICD-10-CM | POA: Diagnosis not present

## 2016-02-22 DIAGNOSIS — Z0000 Encounter for general adult medical examination without abnormal findings: Secondary | ICD-10-CM | POA: Diagnosis not present

## 2016-02-22 HISTORY — DX: Hyperglycemia, unspecified: R73.9

## 2016-02-22 MED ORDER — RANITIDINE HCL 150 MG PO CAPS: 150.0000 mg | ORAL_CAPSULE | Freq: Two times a day (BID) | ORAL | 11 refills | Status: DC

## 2016-02-22 NOTE — Assessment & Plan Note (Signed)
Worse lately-this may be related to menopause Reviewed stressors/ coping techniques/symptoms/ support sources/ tx options and side effects in detail today  Offered xanax for prn in the future if any upcoming stressors such as airplane ride or medical tests She does not desire daily medication or couseling

## 2016-02-22 NOTE — Assessment & Plan Note (Signed)
Discussed how this problem influences overall health and the risks it imposes  Reviewed plan for weight loss with lower calorie diet (via better food choices and also portion control or program like weight watchers) and exercise building up to or more than 30 minutes 5 days per week including some aerobic activity    

## 2016-02-22 NOTE — Assessment & Plan Note (Signed)
Mildly elevated fasting glucose Disc need for wt loss and low glycemic diet/exercise to prevent DM Will check A1C with upcoming labs

## 2016-02-22 NOTE — Assessment & Plan Note (Signed)
Reviewed health habits including diet and exercise and skin cancer prevention Reviewed appropriate screening tests for age  Also reviewed health mt list, fam hx and immunization status , as well as social and family history   See HPI Labs reviewed  Will repeat lipid and check A1C in future after diet change Enc wt loss  Strategy for panic attacks discussed

## 2016-02-22 NOTE — Patient Instructions (Addendum)
In the future if you need xanax for a plane trip/ MRI etc- just let us know  If you feel like panic attacks are more frequently - then we may want to start you on daily medicine  If the zantac (ranitidine) does not work for nausea- let us know (you may have to stop the diclofenac) Exercise helps anxiety also-keep at it/walking   Cholesterol is up  Avoid red meat/ fried foods/ egg yolks/ fatty breakfast meats/ butter, cheese and high fat dairy/ and shellfish   Schedule fasting labs in 3 months to re check it  May consider a statin medicine if not improving   Glucose is mildly elevated- watch sweets/sweet drinks/sugar, and carbs (bread, pasta, potato, rice) -- brown is better than white  We will also check a diabetes test in 3 months  This should help you loose weight

## 2016-02-22 NOTE — Assessment & Plan Note (Signed)
In the past linked to gastritis/dyspepsia  This may be exacerbated by recent nsaid for plantar fasciitis  Trial of zantac 150 bid and update  May need to stop nsaid  Will follow

## 2016-02-22 NOTE — Assessment & Plan Note (Signed)
Cholesterol is up / LDL  Poss due to genetics and eating baked goods more often Disc goals for lipids and reasons to control them Rev labs with pt Rev low sat fat diet in detail  Planned re check of lipid after diet change Consider statin if not improved

## 2016-02-22 NOTE — Progress Notes (Signed)
Pre visit review using our clinic review tool, if applicable. No additional management support is needed unless otherwise documented below in the visit note. 

## 2016-02-22 NOTE — Progress Notes (Signed)
Subjective:    Patient ID: Victoria [name], female    DOB: 12-09-1960, 55 y.o.   MRN: [id number]  HPI  Here for health maintenance exam and to review chronic medical problems    For the last 2 weeks Victoria [name] has been very nauseated on and off  No other symptoms  Of note Victoria [name] had it in the past 2014- and we treated with zantac  Now on diclofenac for plantar facsiitis - this may make it worse  Not having heartburn or other symptoms  Diet has not changed - and some foods may make Victoria [name] worse (beef/eggs)   Victoria [name] does have some more panic attacks - with bad stress in combo with closed in space  For example parking decks (elevator is less bad), MRI , going under a bridge  Worried if Victoria [name] may have to get on a plane  Otherwise does ok  Ever since menopause   Wt Readings from Last 3 Encounters:  02/22/16 202 lb 8 oz (91.9 kg)  12/02/15 200 lb 8 oz (90.9 kg)  11/05/14 208 lb (94.3 kg)  not eating as much and trying to walk for exercise (4 days per week at least 30 minutes)  bmi of 35.8 Obese range  Flu shot - declines   Hep C/HIV screen -not high risk/declines   Mammogram 2/17-neg Self breast exam - no lumps   Pap 4/16-nl with neg HPV Post menopausal - since age 55  No vaginal bleeding at all or d/c No symptoms - just vaginal dryness- worse lately  Mother had uterine cancer   Hx of hyperlipidemia Lab Results  Component Value Date   CHOL 224 (H) 02/15/2016   CHOL 210 (H) 09/02/2014   CHOL 167 02/11/2013   Lab Results  Component Value Date   HDL 45.10 02/15/2016   HDL 44.70 09/02/2014   HDL 40.00 02/11/2013   Lab Results  Component Value Date   LDLCALC 156 (H) 02/15/2016   LDLCALC 144 (H) 09/02/2014   LDLCALC 114 (H) 02/11/2013   Lab Results  Component Value Date   TRIG 114.0 02/15/2016   TRIG 108.0 09/02/2014   TRIG 65.0 02/11/2013   Lab Results  Component Value Date   CHOLHDL 5 02/15/2016   CHOLHDL 5 09/02/2014   CHOLHDL 4 02/11/2013   No results found for:  LDLDIRECT  Of note- 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 - eats some of what Victoria [name] bakes -that is a problem (eggs and butter)  No red meat  No fried foods  No shellfish  Some cheese-not a lot  No ice cream  No bacon/sausage or biscuits  Some family hx of high cholesterol    Colonoscopy 3/13 nl with 10 year recall Dr Ardis Hughs  Tetanus shot 4/16  Results for orders placed or performed in visit on 02/15/16  CBC with Differential/Platelet  Result Value Ref Range   WBC 6.9 4.0 - 10.5 K/uL   RBC 4.95 3.87 - 5.11 Mil/uL   Hemoglobin 13.9 12.0 - 15.0 g/dL   HCT 40.8 36.0 - 46.0 %   MCV 82.6 78.0 - 100.0 fl   MCHC 34.0 30.0 - 36.0 g/dL   RDW 13.3 11.5 - 15.5 %   Platelets 281.0 150.0 - 400.0 K/uL   Neutrophils Relative % 68.0 43.0 - 77.0 %   Lymphocytes Relative 25.8 12.0 - 46.0 %   Monocytes Relative 5.1 3.0 - 12.0 %   Eosinophils Relative 0.7 0.0 - 5.0 %   Basophils Relative 0.4 0.0 - 3.0 %  Neutro Abs 4.7 1.4 - 7.7 K/uL   Lymphs Abs 1.8 0.7 - 4.0 K/uL   Monocytes Absolute 0.4 0.1 - 1.0 K/uL   Eosinophils Absolute 0.0 0.0 - 0.7 K/uL   Basophils Absolute 0.0 0.0 - 0.1 K/uL  Comprehensive metabolic panel  Result Value Ref Range   Sodium 139 135 - 145 mEq/L   Potassium 3.7 3.5 - 5.1 mEq/L   Chloride 104 96 - 112 mEq/L   CO2 28 19 - 32 mEq/L   Glucose, Bld 105 (H) 70 - 99 mg/dL   BUN 9 6 - 23 mg/dL   Creatinine, Ser 0.67 0.40 - 1.20 mg/dL   Total Bilirubin 0.5 0.2 - 1.2 mg/dL   Alkaline Phosphatase 70 39 - 117 U/L   AST 19 0 - 37 U/L   ALT 25 0 - 35 U/L   Total Protein 7.3 6.0 - 8.3 g/dL   Albumin 4.1 3.5 - 5.2 g/dL   Calcium 9.1 8.4 - 10.5 mg/dL   GFR 117.55 >60.00 mL/min  Lipid panel  Result Value Ref Range   Cholesterol 224 (H) 0 - 200 mg/dL   Triglycerides 114.0 0.0 - 149.0 mg/dL   HDL 45.10 >39.00 mg/dL   VLDL 22.8 0.0 - 40.0 mg/dL   LDL Cholesterol 156 (H) 0 - 99 mg/dL   Total CHOL/HDL Ratio 5    NonHDL 178.97   TSH  Result Value Ref Range   TSH 3.11 0.35 - 4.50 uIU/mL    Patient  Active Problem List   Diagnosis Date Noted  . Panic attacks 02/22/2016  . Hyperglycemia 02/22/2016  . Encounter for routine gynecological examination 09/02/2014  . Nausea without vomiting 08/06/2012  . Urine frequency 08/06/2012  . Special screening for malignant neoplasms, colon 06/08/2011  . Hyperlipidemia 06/08/2011  . Routine general medical examination at a health care facility 06/01/2011  . LIPOMA 12/30/2009  . OTHER SPECIFIED ANEMIAS 06/09/2009  . FIBROIDS, UTERUS 04/27/2009  . Obesity 03/19/2007  . ALLERGIC RHINITIS 02/27/2007   Past Medical History:  Diagnosis Date  . Allergic rhinitis   . Anemia, iron deficiency   . Heavy menses   . Lipoma    on L arm  . Uterine fibroid    Past Surgical History:  Procedure Laterality Date  . CESAREAN SECTION    . CHOLECYSTECTOMY     11/07/11  . ENDOMETRIAL ABLATION     fibroids  . Pelvic U/S and transvaginal  04/27/2009   fibroids x 2  . TONSILLECTOMY    . TUBAL LIGATION    . Uterine ultrasound  06/1998   and endo biopsy- neg   Social History  Substance Use Topics  . Smoking status: Never Smoker  . Smokeless tobacco: Never Used  . Alcohol use No   Family History  Problem Relation Age of Onset  . Diabetes Mother   . Hypertension Mother   . Hyperlipidemia Mother   . Cancer Mother     uterine cancer  . Lung cancer Mother     died 53  . Diabetes Father   . Alcohol abuse Father   . Alcohol abuse Sister   . Hypertension Sister   . Diabetes Sister     boarderline  . Alcohol abuse Brother   . Hypertension Brother   . Diabetes Maternal Grandmother   . Alcohol abuse Paternal Grandfather   . Alcohol abuse Brother   . Alcohol abuse Brother   . Alcohol abuse Brother   . Diabetes Sister  severe  . Colon cancer Neg Hx    Allergies  Allergen Reactions  . Codeine     REACTION: nausea and vomiting  . Doxycycline Nausea Only   Current Outpatient Prescriptions on File Prior to Visit  Medication Sig Dispense  Refill  . diclofenac (VOLTAREN) 75 MG EC tablet Take 1 tablet (75 mg total) by mouth 2 (two) times daily. 50 tablet 2   No current facility-administered medications on file prior to visit.     Review of Systems    Review of Systems  Constitutional: Negative for fever,, fatigue and unexpected weight change.  Eyes: Negative for pain and visual disturbance.  Respiratory: Negative for cough and shortness of breath.   Cardiovascular: Negative for cp or palpitations    Gastrointestinal: Negative for abdominal pain, heartburn , diarrhea and constipation.  Genitourinary: Negative for urgency and frequency. neg for dysuria  Skin: Negative for pallor or rash   MSK pos for improving foot pain  Neurological: Negative for weakness, light-headedness, numbness and headaches.  Hematological: Negative for adenopathy. Does not bruise/bleed easily.  Psychiatric/Behavioral: Negative for dysphoric mood. The patient occ nervous/anxious.  pos for panic attacks     Objective:   Physical Exam  Constitutional: Victoria [name] appears well-developed and well-nourished. No distress.  obese and well appearing   HENT:  Head: Normocephalic and atraumatic.  Right Ear: External ear normal.  Left Ear: External ear normal.  Mouth/Throat: Oropharynx is clear and moist.  Nares are boggy  Eyes: Conjunctivae and EOM are normal. Pupils are equal, round, and reactive to light. No scleral icterus.  Neck: Normal range of motion. Neck supple. No JVD present. Carotid bruit is not present. No thyromegaly present.  Cardiovascular: Normal rate, regular rhythm, normal heart sounds and intact distal pulses.  Exam reveals no gallop.   Pulmonary/Chest: Effort normal and breath sounds normal. No respiratory distress. Victoria [name] has no wheezes. Victoria [name] exhibits no tenderness.  Abdominal: Soft. Bowel sounds are normal. Victoria [name] exhibits no distension, no abdominal bruit and no mass. There is no tenderness. There is no rebound and no guarding.  Genitourinary: No  breast swelling, tenderness, discharge or bleeding.  Genitourinary Comments: Breast exam: No mass, nodules, thickening, tenderness, bulging, retraction, inflamation, nipple discharge or skin changes noted.  No axillary or clavicular LA.      Musculoskeletal: Normal range of motion. Victoria [name] exhibits no edema or tenderness.  Lymphadenopathy:    Victoria [name] has no cervical adenopathy.  Neurological: Victoria [name] is alert. Victoria [name] has normal reflexes. No cranial nerve deficit. Victoria [name] exhibits normal muscle tone. Coordination normal.  Skin: Skin is warm and dry. No rash noted. No erythema. No pallor.  Some lentigines and skin tags   Psychiatric: Victoria [name] has a normal mood and affect.          Assessment & Plan:   Problem List Items Addressed This Visit      Other   Hyperglycemia    Mildly elevated fasting glucose Disc need for wt loss and low glycemic diet/exercise to prevent DM Will check A1C with upcoming labs       Relevant Orders   Hemoglobin A1c   Hyperlipidemia    Cholesterol is up / LDL  Poss due to genetics and eating baked goods more often Disc goals for lipids and reasons to control them Rev labs with pt Rev low sat fat diet in detail  Planned re check of lipid after diet change Consider statin if not improved       Relevant Orders   Lipid  panel   Nausea without vomiting    In the past linked to gastritis/dyspepsia  This may be exacerbated by recent nsaid for plantar fasciitis  Trial of zantac 150 bid and update  May need to stop nsaid  Will follow       Obesity    Discussed how this problem influences overall health and the risks it imposes  Reviewed plan for weight loss with lower calorie diet (via better food choices and also portion control or program like weight watchers) and exercise building up to or more than 30 minutes 5 days per week including some aerobic activity         Panic attacks    Worse lately-this may be related to menopause Reviewed stressors/ coping  techniques/symptoms/ support sources/ tx options and side effects in detail today  Offered xanax for prn in the future if any upcoming stressors such as airplane ride or medical tests Victoria [name] does not desire daily medication or couseling      Routine general medical examination at a health care facility - Primary    Reviewed health habits including diet and exercise and skin cancer prevention Reviewed appropriate screening tests for age  Also reviewed health mt list, fam hx and immunization status , as well as social and family history   See HPI Labs reviewed  Will repeat lipid and check A1C in future after diet change Enc wt loss  Strategy for panic attacks discussed        Other Visit Diagnoses   None.

## 2016-05-24 ENCOUNTER — Other Ambulatory Visit (INDEPENDENT_AMBULATORY_CARE_PROVIDER_SITE_OTHER): Payer: No Typology Code available for payment source

## 2016-05-24 DIAGNOSIS — E7800 Pure hypercholesterolemia, unspecified: Secondary | ICD-10-CM

## 2016-05-24 DIAGNOSIS — R739 Hyperglycemia, unspecified: Secondary | ICD-10-CM

## 2016-05-24 LAB — LIPID PANEL
CHOL/HDL RATIO: 5
Cholesterol: 212 mg/dL — ABNORMAL HIGH (ref 0–200)
HDL: 42.5 mg/dL (ref >39.00)
LDL Cholesterol: 150 mg/dL — ABNORMAL HIGH (ref 0–99)
NONHDL: 169.54
Triglycerides: 99 mg/dL (ref 0.0–149.0)
VLDL: 19.8 mg/dL (ref 0.0–40.0)

## 2016-05-24 LAB — HEMOGLOBIN A1C: Hgb A1c MFr Bld: 5.9 % (ref 4.6–6.5)

## 2016-05-25 ENCOUNTER — Other Ambulatory Visit: Payer: Self-pay | Admitting: Family Medicine

## 2016-05-25 DIAGNOSIS — Z1231 Encounter for screening mammogram for malignant neoplasm of breast: Secondary | ICD-10-CM

## 2016-05-30 ENCOUNTER — Encounter: Payer: Self-pay | Admitting: Family Medicine

## 2016-06-27 ENCOUNTER — Ambulatory Visit: Payer: No Typology Code available for payment source

## 2016-07-04 ENCOUNTER — Ambulatory Visit
Admission: RE | Admit: 2016-07-04 | Discharge: 2016-07-04 | Disposition: A | Discharge status: Home / Self Care | Payer: No Typology Code available for payment source | Source: Ambulatory Visit | Attending: Family Medicine | Admitting: Family Medicine

## 2016-07-04 DIAGNOSIS — Z1231 Encounter for screening mammogram for malignant neoplasm of breast: Secondary | ICD-10-CM

## 2016-11-02 ENCOUNTER — Ambulatory Visit (INDEPENDENT_AMBULATORY_CARE_PROVIDER_SITE_OTHER): Payer: No Typology Code available for payment source | Admitting: Family Medicine

## 2016-11-02 ENCOUNTER — Encounter: Payer: Self-pay | Admitting: Family Medicine

## 2016-11-02 VITALS — BP 124/68 | HR 81 | Temp 98.3°F | Ht 63.0 in | Wt 205.5 lb

## 2016-11-02 DIAGNOSIS — N3000 Acute cystitis without hematuria: Secondary | ICD-10-CM | POA: Diagnosis not present

## 2016-11-02 DIAGNOSIS — N390 Urinary tract infection, site not specified: Secondary | ICD-10-CM | POA: Insufficient documentation

## 2016-11-02 DIAGNOSIS — R300 Dysuria: Secondary | ICD-10-CM | POA: Diagnosis not present

## 2016-11-02 LAB — POC URINALSYSI DIPSTICK (AUTOMATED)
Bilirubin, UA: NEGATIVE
Glucose, UA: NEGATIVE
KETONES UA: NEGATIVE
NITRITE UA: NEGATIVE
PH UA: 6 (ref 5.0–8.0)
Protein, UA: NEGATIVE
Spec Grav, UA: 1.015 (ref 1.010–1.025)
Urobilinogen, UA: 0.2 E.U./dL

## 2016-11-02 MED ORDER — CIPROFLOXACIN HCL 250 MG PO TABS: 250.0000 mg | ORAL_TABLET | Freq: Two times a day (BID) | ORAL | 0 refills | Status: DC

## 2016-11-02 NOTE — Patient Instructions (Addendum)
Take the cipro for uti twice daily (start today) Drink lots and lots of water Update if not starting to improve in a week or if worsening    We will culture your urine and alert you with a result     Urinary Tract Infection, Adult A urinary tract infection (UTI) is an infection of any part of the urinary tract. The urinary tract includes the:  Kidneys.  Ureters.  Bladder.  Urethra.  These organs make, store, and get rid of pee (urine) in the body. Follow these instructions at home:  Take over-the-counter and prescription medicines only as told by your doctor.  If you were prescribed an antibiotic medicine, take it as told by your doctor. Do not stop taking the antibiotic even if you start to feel better.  Avoid the following drinks: ? Alcohol. ? Caffeine. ? Tea. ? Carbonated drinks.  Drink enough fluid to keep your pee clear or pale yellow.  Keep all follow-up visits as told by your doctor. This is important.  Make sure to: ? Empty your bladder often and completely. Do not to hold pee for long periods of time. ? Empty your bladder before and after sex. ? Wipe from front to back after a bowel movement if you are female. Use each tissue one time when you wipe. Contact a doctor if:  You have back pain.  You have a fever.  You feel sick to your stomach (nauseous).  You throw up (vomit).  Your symptoms do not get better after 3 days.  Your symptoms go away and then come back. Get help right away if:  You have very bad back pain.  You have very bad lower belly (abdominal) pain.  You are throwing up and cannot keep down any medicines or water. This information is not intended to replace advice given to you by your health care provider. Make sure you discuss any questions you have with your health care provider. Document Released: 10/19/2007 Document Revised: 10/08/2015 Document Reviewed: 03/23/2015 Elsevier Interactive Patient Education  [employer].

## 2016-11-02 NOTE — Progress Notes (Signed)
Subjective:    Patient ID: Victoria [name], female    DOB: October 20, 1960, 56 y.o.   MRN: [id number]  HPI Here for urinary symptoms  Started hurting Sunday evening (bladder and dysuria) Frequency and urgency  Bought AZO- helped temporarily  Worse at night   No fever  A little nauseated  Low back pain /not flank    Pos ua -blood and leuk Results for orders placed or performed in visit on 11/02/16  POCT Urinalysis Dipstick (Automated)  Result Value Ref Range   Color, UA Yellow    Clarity, UA Cloudy    Glucose, UA Negative    Bilirubin, UA Negative    Ketones, UA Negative    Spec Grav, UA 1.015 1.010 - 1.025   Blood, UA 25 Ery/uL    pH, UA 6.0 5.0 - 8.0   Protein, UA Negative    Urobilinogen, UA 0.2 0.2 or 1.0 E.U./dL   Nitrite, UA Negative    Leukocytes, UA Large (3+) (A) Negative     Patient Active Problem List   Diagnosis Date Noted  . UTI (urinary tract infection) 11/02/2016  . Panic attacks 02/22/2016  . Hyperglycemia 02/22/2016  . Encounter for routine gynecological examination 09/02/2014  . Nausea without vomiting 08/06/2012  . Special screening for malignant neoplasms, colon 06/08/2011  . Hyperlipidemia 06/08/2011  . Routine general medical examination at a health care facility 06/01/2011  . LIPOMA 12/30/2009  . OTHER SPECIFIED ANEMIAS 06/09/2009  . FIBROIDS, UTERUS 04/27/2009  . Obesity 03/19/2007  . ALLERGIC RHINITIS 02/27/2007   Past Medical History:  Diagnosis Date  . Allergic rhinitis   . Anemia, iron deficiency   . Heavy menses   . Lipoma    on L arm  . Uterine fibroid    Past Surgical History:  Procedure Laterality Date  . CESAREAN SECTION    . CHOLECYSTECTOMY     11/07/11  . ENDOMETRIAL ABLATION     fibroids  . Pelvic U/S and transvaginal  04/27/2009   fibroids x 2  . TONSILLECTOMY    . TUBAL LIGATION    . Uterine ultrasound  06/1998   and endo biopsy- neg   Social History  Substance Use Topics  . Smoking status: Never Smoker  .  Smokeless tobacco: Never Used  . Alcohol use No   Family History  Problem Relation Age of Onset  . Diabetes Mother   . Hypertension Mother   . Hyperlipidemia Mother   . Cancer Mother        uterine cancer  . Lung cancer Mother        died 58  . Diabetes Father   . Alcohol abuse Father   . Alcohol abuse Sister   . Hypertension Sister   . Diabetes Sister        boarderline  . Alcohol abuse Brother   . Hypertension Brother   . Diabetes Maternal Grandmother   . Alcohol abuse Paternal Grandfather   . Alcohol abuse Brother   . Alcohol abuse Brother   . Alcohol abuse Brother   . Diabetes Sister        severe  . Colon cancer Neg Hx    Allergies  Allergen Reactions  . Codeine     REACTION: nausea and vomiting  . Doxycycline Nausea Only   No current outpatient prescriptions on file prior to visit.   No current facility-administered medications on file prior to visit.     Review of Systems  Constitutional: Positive for fatigue.  Negative for activity change, appetite change and fever.  HENT: Negative for congestion and sore throat.   Eyes: Negative for itching and visual disturbance.  Respiratory: Negative for cough and shortness of breath.   Cardiovascular: Negative for leg swelling.  Gastrointestinal: Positive for nausea. Negative for abdominal distention, abdominal pain, constipation and diarrhea.  Endocrine: Negative for cold intolerance and polydipsia.  Genitourinary: Positive for dysuria, frequency and urgency. Negative for difficulty urinating, flank pain and hematuria.  Musculoskeletal: Negative for myalgias.  Skin: Negative for rash.  Allergic/Immunologic: Negative for immunocompromised state.  Neurological: Negative for dizziness and weakness.  Hematological: Negative for adenopathy.       Objective:   Physical Exam  Constitutional: She appears well-developed and well-nourished. No distress.  overwt and well app  HENT:  Head: Normocephalic and atraumatic.    Eyes: Conjunctivae and EOM are normal. Pupils are equal, round, and reactive to light.  Neck: Normal range of motion. Neck supple.  Cardiovascular: Normal rate, regular rhythm and normal heart sounds.   Pulmonary/Chest: Effort normal and breath sounds normal.  Abdominal: Soft. Bowel sounds are normal. She exhibits no distension. There is tenderness. There is no rebound.  No cva tenderness  Mild suprapubic tenderness  Musculoskeletal: She exhibits no edema.  Lymphadenopathy:    She has no cervical adenopathy.  Neurological: She is alert.  Skin: No rash noted.  Psychiatric: She has a normal mood and affect.          Assessment & Plan:   Problem List Items Addressed This Visit      Genitourinary   UTI (urinary tract infection) - Primary    Uncomplicated Enc water intake cipro 250 bid 5 d (works well for her) Disc ways to prevent uti (handout given) cx pending      Relevant Orders   Urine Culture    Other Visit Diagnoses    Dysuria       Relevant Orders   POCT Urinalysis Dipstick (Automated) (Completed)

## 2016-11-02 NOTE — Assessment & Plan Note (Signed)
Uncomplicated Enc water intake cipro 250 bid 5 d (works well for her) Disc ways to prevent uti (handout given) cx pending

## 2016-11-04 LAB — URINE CULTURE

## 2017-02-12 ENCOUNTER — Telehealth: Payer: Self-pay | Admitting: Family Medicine

## 2017-02-12 DIAGNOSIS — E7800 Pure hypercholesterolemia, unspecified: Secondary | ICD-10-CM

## 2017-02-12 DIAGNOSIS — Z0000 Encounter for general adult medical examination without abnormal findings: Secondary | ICD-10-CM

## 2017-02-12 DIAGNOSIS — R739 Hyperglycemia, unspecified: Secondary | ICD-10-CM

## 2017-02-12 NOTE — Telephone Encounter (Signed)
-----   Message from Marchia Bond sent at 02/09/2017  1:43 PM EDT ----- Regarding: Cpx labs Mon 10/8, need orders. Thanks! :-) Please order  future cpx labs for pt's upcoming lab appt. Thanks Aniceto Boss

## 2017-02-20 ENCOUNTER — Other Ambulatory Visit (INDEPENDENT_AMBULATORY_CARE_PROVIDER_SITE_OTHER): Payer: No Typology Code available for payment source

## 2017-02-20 DIAGNOSIS — Z0000 Encounter for general adult medical examination without abnormal findings: Secondary | ICD-10-CM | POA: Diagnosis not present

## 2017-02-20 DIAGNOSIS — R739 Hyperglycemia, unspecified: Secondary | ICD-10-CM

## 2017-02-20 DIAGNOSIS — E7800 Pure hypercholesterolemia, unspecified: Secondary | ICD-10-CM

## 2017-02-20 LAB — LIPID PANEL
CHOL/HDL RATIO: 5
Cholesterol: 175 mg/dL (ref 0–200)
HDL: 37.3 mg/dL — AB (ref >39.00)
LDL Cholesterol: 124 mg/dL — ABNORMAL HIGH (ref 0–99)
NONHDL: 138.11
Triglycerides: 73 mg/dL (ref 0.0–149.0)
VLDL: 14.6 mg/dL (ref 0.0–40.0)

## 2017-02-20 LAB — TSH: TSH: 2.69 u[IU]/mL (ref 0.35–4.50)

## 2017-02-20 LAB — COMPREHENSIVE METABOLIC PANEL
ALK PHOS: 66 U/L (ref 39–117)
ALT: 19 U/L (ref 0–35)
AST: 16 U/L (ref 0–37)
Albumin: 4.2 g/dL (ref 3.5–5.2)
BILIRUBIN TOTAL: 0.4 mg/dL (ref 0.2–1.2)
BUN: 10 mg/dL (ref 6–23)
CO2: 27 mEq/L (ref 19–32)
CREATININE: 0.7 mg/dL (ref 0.40–1.20)
Calcium: 9.2 mg/dL (ref 8.4–10.5)
Chloride: 104 mEq/L (ref 96–112)
GFR: 111.34 mL/min (ref >60.00)
GLUCOSE: 98 mg/dL (ref 70–99)
Potassium: 3.7 mEq/L (ref 3.5–5.1)
SODIUM: 137 meq/L (ref 135–145)
TOTAL PROTEIN: 7.1 g/dL (ref 6.0–8.3)

## 2017-02-20 LAB — CBC WITH DIFFERENTIAL/PLATELET
BASOS ABS: 0.1 10*3/uL (ref 0.0–0.1)
Basophils Relative: 1 % (ref 0.0–3.0)
EOS ABS: 0.1 10*3/uL (ref 0.0–0.7)
Eosinophils Relative: 1.7 % (ref 0.0–5.0)
HCT: 40.2 % (ref 36.0–46.0)
Hemoglobin: 13.3 g/dL (ref 12.0–15.0)
LYMPHS ABS: 2.1 10*3/uL (ref 0.7–4.0)
Lymphocytes Relative: 33 % (ref 12.0–46.0)
MCHC: 33.2 g/dL (ref 30.0–36.0)
MCV: 83.3 fl (ref 78.0–100.0)
MONO ABS: 0.4 10*3/uL (ref 0.1–1.0)
Monocytes Relative: 6 % (ref 3.0–12.0)
NEUTROS PCT: 58.3 % (ref 43.0–77.0)
Neutro Abs: 3.7 10*3/uL (ref 1.4–7.7)
Platelets: 271 10*3/uL (ref 150.0–400.0)
RBC: 4.82 Mil/uL (ref 3.87–5.11)
RDW: 13.4 % (ref 11.5–15.5)
WBC: 6.3 10*3/uL (ref 4.0–10.5)

## 2017-02-20 LAB — HEMOGLOBIN A1C: Hgb A1c MFr Bld: 6 % (ref 4.6–6.5)

## 2017-02-22 ENCOUNTER — Encounter: Payer: Self-pay | Admitting: Family Medicine

## 2017-02-22 ENCOUNTER — Ambulatory Visit (INDEPENDENT_AMBULATORY_CARE_PROVIDER_SITE_OTHER): Payer: No Typology Code available for payment source | Admitting: Family Medicine

## 2017-02-22 VITALS — BP 126/82 | HR 68 | Temp 98.4°F | Ht 62.75 in | Wt 204.5 lb

## 2017-02-22 DIAGNOSIS — E785 Hyperlipidemia, unspecified: Secondary | ICD-10-CM | POA: Diagnosis not present

## 2017-02-22 DIAGNOSIS — Z0000 Encounter for general adult medical examination without abnormal findings: Secondary | ICD-10-CM | POA: Diagnosis not present

## 2017-02-22 DIAGNOSIS — R739 Hyperglycemia, unspecified: Secondary | ICD-10-CM | POA: Diagnosis not present

## 2017-02-22 DIAGNOSIS — E6609 Other obesity due to excess calories: Secondary | ICD-10-CM

## 2017-02-22 DIAGNOSIS — E7800 Pure hypercholesterolemia, unspecified: Secondary | ICD-10-CM

## 2017-02-22 DIAGNOSIS — Z6835 Body mass index (BMI) 35.0-35.9, adult: Secondary | ICD-10-CM | POA: Diagnosis not present

## 2017-02-22 NOTE — Progress Notes (Signed)
Subjective:    Patient ID: Victoria [name], female    DOB: 1960/09/21, 56 y.o.   MRN: [id number]  HPI Here for health maintenance exam and to review chronic medical problems    Doing well overall   Her left leg hurts -mostly at night  Lower /below the knee  Has 2 dark veins there   ? Cause  Does not wear support hose  No swelling  It could also be from her back  Wt Readings from Last 3 Encounters:  02/22/17 204 lb 8 oz (92.8 kg)  11/02/16 205 lb 8 oz (93.2 kg)  02/22/16 202 lb 8 oz (91.9 kg)  stable  Trying to eat healthy  Walks 30 or more minutes per day  36.51 kg/m   Declines flu shot   Mammogram 2/18- normal  Self breast exam -no lumps   Pap 4/16 nl with neg HPV  No abn paps in life  No bleeding  Mother had uterine ca  No gyn symptoms    Colonoscopy 3/13 10 y recall   Tdap 4/16  Hyperlipidemia Lab Results  Component Value Date   CHOL 175 02/20/2017   CHOL 212 (H) 05/24/2016   CHOL 224 (H) 02/15/2016   Lab Results  Component Value Date   HDL 37.30 (L) 02/20/2017   HDL 42.50 05/24/2016   HDL 45.10 02/15/2016   Lab Results  Component Value Date   LDLCALC 124 (H) 02/20/2017   LDLCALC 150 (H) 05/24/2016   LDLCALC 156 (H) 02/15/2016   Lab Results  Component Value Date   TRIG 73.0 02/20/2017   TRIG 99.0 05/24/2016   TRIG 114.0 02/15/2016   Lab Results  Component Value Date   CHOLHDL 5 02/20/2017   CHOLHDL 5 05/24/2016   CHOLHDL 5 02/15/2016   No results found for: LDLDIRECT   Walking to raise HDL  Does not eat a lot of fish  Is eating less sweets - still baking but giving a lot away    Hyperglycemia Lab Results  Component Value Date   HGBA1C 6.0 02/20/2017  fairly stable  Watching carbs   Results for orders placed or performed in visit on 02/20/17  CBC with Differential/Platelet  Result Value Ref Range   WBC 6.3 4.0 - 10.5 K/uL   RBC 4.82 3.87 - 5.11 Mil/uL   Hemoglobin 13.3 12.0 - 15.0 g/dL   HCT 40.2 36.0 - 46.0 %   MCV  83.3 78.0 - 100.0 fl   MCHC 33.2 30.0 - 36.0 g/dL   RDW 13.4 11.5 - 15.5 %   Platelets 271.0 150.0 - 400.0 K/uL   Neutrophils Relative % 58.3 43.0 - 77.0 %   Lymphocytes Relative 33.0 12.0 - 46.0 %   Monocytes Relative 6.0 3.0 - 12.0 %   Eosinophils Relative 1.7 0.0 - 5.0 %   Basophils Relative 1.0 0.0 - 3.0 %   Neutro Abs 3.7 1.4 - 7.7 K/uL   Lymphs Abs 2.1 0.7 - 4.0 K/uL   Monocytes Absolute 0.4 0.1 - 1.0 K/uL   Eosinophils Absolute 0.1 0.0 - 0.7 K/uL   Basophils Absolute 0.1 0.0 - 0.1 K/uL  Comprehensive metabolic panel  Result Value Ref Range   Sodium 137 135 - 145 mEq/L   Potassium 3.7 3.5 - 5.1 mEq/L   Chloride 104 96 - 112 mEq/L   CO2 27 19 - 32 mEq/L   Glucose, Bld 98 70 - 99 mg/dL   BUN 10 6 - 23 mg/dL   Creatinine, Ser  0.70 0.40 - 1.20 mg/dL   Total Bilirubin 0.4 0.2 - 1.2 mg/dL   Alkaline Phosphatase 66 39 - 117 U/L   AST 16 0 - 37 U/L   ALT 19 0 - 35 U/L   Total Protein 7.1 6.0 - 8.3 g/dL   Albumin 4.2 3.5 - 5.2 g/dL   Calcium 9.2 8.4 - 10.5 mg/dL   GFR 111.34 >60.00 mL/min  Hemoglobin A1c  Result Value Ref Range   Hgb A1c MFr Bld 6.0 4.6 - 6.5 %  Lipid panel  Result Value Ref Range   Cholesterol 175 0 - 200 mg/dL   Triglycerides 73.0 0.0 - 149.0 mg/dL   HDL 37.30 (L) >39.00 mg/dL   VLDL 14.6 0.0 - 40.0 mg/dL   LDL Cholesterol 124 (H) 0 - 99 mg/dL   Total CHOL/HDL Ratio 5    NonHDL 138.11   TSH  Result Value Ref Range   TSH 2.69 0.35 - 4.50 uIU/mL    Other labs are re assuring   Patient Active Problem List   Diagnosis Date Noted  . Panic attacks 02/22/2016  . Hyperglycemia 02/22/2016  . Encounter for routine gynecological examination 09/02/2014  . Special screening for malignant neoplasms, colon 06/08/2011  . Hyperlipidemia 06/08/2011  . Routine general medical examination at a health care facility 06/01/2011  . LIPOMA 12/30/2009  . FIBROIDS, UTERUS 04/27/2009  . Obesity 03/19/2007  . ALLERGIC RHINITIS 02/27/2007   Past Medical History:    Diagnosis Date  . Allergic rhinitis   . Anemia, iron deficiency   . Heavy menses   . Lipoma    on L arm  . Uterine fibroid    Past Surgical History:  Procedure Laterality Date  . CESAREAN SECTION    . CHOLECYSTECTOMY     11/07/11  . ENDOMETRIAL ABLATION     fibroids  . Pelvic U/S and transvaginal  04/27/2009   fibroids x 2  . TONSILLECTOMY    . TUBAL LIGATION    . Uterine ultrasound  06/1998   and endo biopsy- neg   Social History  Substance Use Topics  . Smoking status: Never Smoker  . Smokeless tobacco: Never Used  . Alcohol use No   Family History  Problem Relation Age of Onset  . Diabetes Mother   . Hypertension Mother   . Hyperlipidemia Mother   . Cancer Mother        uterine cancer  . Lung cancer Mother        died 39  . Diabetes Father   . Alcohol abuse Father   . Alcohol abuse Sister   . Hypertension Sister   . Diabetes Sister        boarderline  . Alcohol abuse Brother   . Hypertension Brother   . Diabetes Maternal Grandmother   . Alcohol abuse Paternal Grandfather   . Alcohol abuse Brother   . Alcohol abuse Brother   . Alcohol abuse Brother   . Diabetes Sister        severe  . Colon cancer Neg Hx    Allergies  Allergen Reactions  . Codeine     REACTION: nausea and vomiting  . Doxycycline Nausea Only   No current outpatient prescriptions on file prior to visit.   No current facility-administered medications on file prior to visit.     Review of Systems  Constitutional: Negative for activity change, appetite change, fatigue, fever and unexpected weight change.  HENT: Negative for congestion, ear pain, rhinorrhea, sinus pressure and  sore throat.   Eyes: Negative for pain, redness and visual disturbance.  Respiratory: Negative for cough, shortness of breath and wheezing.   Cardiovascular: Negative for chest pain and palpitations.  Gastrointestinal: Negative for abdominal pain, blood in stool, constipation and diarrhea.  Endocrine:  Negative for polydipsia and polyuria.  Genitourinary: Negative for dysuria, frequency and urgency.  Musculoskeletal: Negative for arthralgias, back pain and myalgias.  Skin: Negative for pallor and rash.  Allergic/Immunologic: Negative for environmental allergies.  Neurological: Negative for dizziness, syncope and headaches.  Hematological: Negative for adenopathy. Does not bruise/bleed easily.  Psychiatric/Behavioral: Negative for decreased concentration and dysphoric mood. The patient is not nervous/anxious.        Objective:   Physical Exam  Constitutional: She appears well-developed and well-nourished. No distress.  obese and well appearing   HENT:  Head: Normocephalic and atraumatic.  Right Ear: External ear normal.  Left Ear: External ear normal.  Mouth/Throat: Oropharynx is clear and moist.  Eyes: Pupils are equal, round, and reactive to light. Conjunctivae and EOM are normal. No scleral icterus.  Neck: Normal range of motion. Neck supple. No JVD present. Carotid bruit is not present. No thyromegaly present.  Cardiovascular: Normal rate, regular rhythm, normal heart sounds and intact distal pulses.  Exam reveals no gallop.   Pulmonary/Chest: Effort normal and breath sounds normal. No respiratory distress. She has no wheezes. She exhibits no tenderness.  Abdominal: Soft. Bowel sounds are normal. She exhibits no distension, no abdominal bruit and no mass. There is no tenderness.  Genitourinary: No breast swelling, tenderness, discharge or bleeding.  Genitourinary Comments: Breast exam: No mass, nodules, thickening, tenderness, bulging, retraction, inflamation, nipple discharge or skin changes noted.  No axillary or clavicular LA.      Musculoskeletal: Normal range of motion. She exhibits no edema or tenderness.  Lymphadenopathy:    She has no cervical adenopathy.  Neurological: She is alert. She has normal reflexes. No cranial nerve deficit. She exhibits normal muscle tone.  Coordination normal.  Skin: Skin is warm and dry. No rash noted. No erythema. No pallor.  Skin tags noted   Psychiatric: She has a normal mood and affect.          Assessment & Plan:   Problem List Items Addressed This Visit      Other   Hyperglycemia    Lab Results  Component Value Date   HGBA1C 6.0 02/20/2017   disc imp of low glycemic diet and wt loss to prevent DM2        Hyperlipidemia    Disc goals for lipids and reasons to control them Rev labs with pt Rev low sat fat diet in detail  LDL is improved  Disc ways to inc HDL       Obesity - Primary    Discussed how this problem influences overall health and the risks it imposes  Reviewed plan for weight loss with lower calorie diet (via better food choices and also portion control or program like weight watchers) and exercise building up to or more than 30 minutes 5 days per week including some aerobic activity   Will try cutting refined carbs       Routine general medical examination at a health care facility    Reviewed health habits including diet and exercise and skin cancer prevention Reviewed appropriate screening tests for age  Also reviewed health mt list, fam hx and immunization status , as well as social and family history   See HPI Labs reviewed  Declines flu shot

## 2017-02-22 NOTE — Patient Instructions (Addendum)
Fish or fish oil supplements may help raise HDL (not shellfish)   For cholesterol : Avoid red meat/ fried foods/ egg yolks/ fatty breakfast meats/ butter, cheese and high fat dairy/ and shellfish   It is improved   For blood sugar and weight loss -- Try to get most of your carbohydrates from produce (with the exception of white potatoes)  Eat less bread/pasta/rice/snack foods/cereals/sweets and other items from the middle of the grocery store (processed carbs)

## 2017-02-23 NOTE — Assessment & Plan Note (Signed)
Lab Results  Component Value Date   HGBA1C 6.0 02/20/2017   disc imp of low glycemic diet and wt loss to prevent DM2

## 2017-02-23 NOTE — Assessment & Plan Note (Signed)
Disc goals for lipids and reasons to control them Rev labs with pt Rev low sat fat diet in detail  LDL is improved  Disc ways to inc HDL

## 2017-02-23 NOTE — Assessment & Plan Note (Signed)
Discussed how this problem influences overall health and the risks it imposes  Reviewed plan for weight loss with lower calorie diet (via better food choices and also portion control or program like weight watchers) and exercise building up to or more than 30 minutes 5 days per week including some aerobic activity   Will try cutting refined carbs

## 2017-02-23 NOTE — Assessment & Plan Note (Addendum)
Reviewed health habits including diet and exercise and skin cancer prevention Reviewed appropriate screening tests for age  Also reviewed health mt list, fam hx and immunization status , as well as social and family history   See HPI Labs reviewed  Declines flu shot

## 2017-05-26 ENCOUNTER — Ambulatory Visit: Payer: No Typology Code available for payment source | Admitting: Family Medicine

## 2017-06-13 ENCOUNTER — Other Ambulatory Visit: Payer: Self-pay | Admitting: Family Medicine

## 2017-06-13 DIAGNOSIS — Z139 Encounter for screening, unspecified: Secondary | ICD-10-CM

## 2017-07-05 ENCOUNTER — Ambulatory Visit
Admission: RE | Admit: 2017-07-05 | Discharge: 2017-07-05 | Disposition: A | Discharge status: Home / Self Care | Payer: No Typology Code available for payment source | Source: Ambulatory Visit | Attending: Family Medicine | Admitting: Family Medicine

## 2017-07-05 DIAGNOSIS — Z139 Encounter for screening, unspecified: Secondary | ICD-10-CM

## 2017-10-11 ENCOUNTER — Encounter: Payer: Self-pay | Admitting: Family Medicine

## 2017-10-11 ENCOUNTER — Ambulatory Visit: Payer: No Typology Code available for payment source | Admitting: Family Medicine

## 2017-10-11 VITALS — BP 134/84 | HR 81 | Temp 98.8°F | Ht 62.75 in | Wt 203.8 lb

## 2017-10-11 DIAGNOSIS — N3000 Acute cystitis without hematuria: Secondary | ICD-10-CM

## 2017-10-11 DIAGNOSIS — R300 Dysuria: Secondary | ICD-10-CM

## 2017-10-11 LAB — POC URINALSYSI DIPSTICK (AUTOMATED)
Bilirubin, UA: NEGATIVE
Blood, UA: 50
Glucose, UA: NEGATIVE
Ketones, UA: NEGATIVE
Nitrite, UA: NEGATIVE
PH UA: 6 (ref 5.0–8.0)
PROTEIN UA: POSITIVE — AB
SPEC GRAV UA: 1.015 (ref 1.010–1.025)
UROBILINOGEN UA: 0.2 U/dL

## 2017-10-11 MED ORDER — SULFAMETHOXAZOLE-TRIMETHOPRIM 800-160 MG PO TABS: 1.0000 | ORAL_TABLET | Freq: Two times a day (BID) | ORAL | 0 refills | Status: DC

## 2017-10-11 NOTE — Progress Notes (Signed)
Subjective:    Patient ID: Victoria [name], female    DOB: [birth year], 57 y.o.   MRN: [id number]  HPI  Here for urinary symptoms -since Friday  Drinking lots of water (nothing else)   Frequent urination  Burns/hurt to urinate  No blood in urine- just looks cloudy  No odor   Low grade temp? Over the weekend  Mild nausea/no vomiting    Wbc /rbc in ua  Results for orders placed or performed in visit on 10/11/17  POCT Urinalysis Dipstick (Automated)  Result Value Ref Range   Color, UA Yellow    Clarity, UA Hazy    Glucose, UA Negative Negative   Bilirubin, UA Negative    Ketones, UA Negative    Spec Grav, UA 1.015 1.010 - 1.025   Blood, UA 50 Ery/uL    pH, UA 6.0 5.0 - 8.0   Protein, UA Positive (A) Negative   Urobilinogen, UA 0.2 0.2 or 1.0 E.U./dL   Nitrite, UA Negative    Leukocytes, UA Large (3+) (A) Negative     Was taking otc med for a cold       Last uti was just over a year ago with contaminated culture    Wt Readings from Last 3 Encounters:  10/11/17 203 lb 12 oz (92.4 kg)  02/22/17 204 lb 8 oz (92.8 kg)  11/02/16 205 lb 8 oz (93.2 kg)     Review of Systems  Constitutional: Positive for fatigue. Negative for activity change, appetite change and fever.       ? Low grade temp  HENT: Negative for congestion and sore throat.   Eyes: Negative for itching and visual disturbance.  Respiratory: Negative for cough and shortness of breath.   Cardiovascular: Negative for leg swelling.  Gastrointestinal: Positive for nausea. Negative for abdominal distention, abdominal pain, constipation, diarrhea and vomiting.  Endocrine: Negative for cold intolerance and polydipsia.  Genitourinary: Positive for dysuria, frequency and urgency. Negative for difficulty urinating, flank pain and hematuria.  Musculoskeletal: Negative for myalgias.  Skin: Negative for rash.  Allergic/Immunologic: Negative for immunocompromised state.  Neurological: Negative for dizziness and  weakness.  Hematological: Negative for adenopathy.       Objective:   Physical Exam  Constitutional: She appears well-developed and well-nourished. No distress.  obese and well appearing   HENT:  Head: Normocephalic and atraumatic.  Eyes: Pupils are equal, round, and reactive to light. Conjunctivae and EOM are normal.  Neck: Normal range of motion. Neck supple.  Cardiovascular: Normal rate, regular rhythm and normal heart sounds.  Pulmonary/Chest: Effort normal and breath sounds normal.  Abdominal: Soft. Bowel sounds are normal. She exhibits no distension. There is tenderness. There is no rebound.  No cva tenderness  Mild suprapubic tenderness  Musculoskeletal: She exhibits no edema.  Lymphadenopathy:    She has no cervical adenopathy.  Neurological: She is alert.  Skin: No rash noted.  Psychiatric: She has a normal mood and affect.          Assessment & Plan:   Problem List Items Addressed This Visit      Genitourinary   UTI (urinary tract infection) - Primary    Pos ua  Pend cx Cover with septra (sun warning given) Enc water intake  Update if not starting to improve in a week or if worsening    Handout given      Relevant Medications   sulfamethoxazole-trimethoprim (BACTRIM DS,SEPTRA DS) 800-160 MG tablet   Other Relevant Orders  Urine Culture    Other Visit Diagnoses    Dysuria       Relevant Orders   POCT Urinalysis Dipstick (Automated) (Completed)

## 2017-10-11 NOTE — Assessment & Plan Note (Signed)
Pos ua  Pend cx Cover with septra (sun warning given) Enc water intake  Update if not starting to improve in a week or if worsening    Handout given

## 2017-10-11 NOTE — Patient Instructions (Addendum)
Take the septra as directed  Drink lots of water  We will contact you when urine culture returns   Update if not starting to improve in several days or if worsening      Urinary Tract Infection, Adult A urinary tract infection (UTI) is an infection of any part of the urinary tract, which includes the kidneys, ureters, bladder, and urethra. These organs make, store, and get rid of urine in the body. UTI can be a bladder infection (cystitis) or kidney infection (pyelonephritis). What are the causes? This infection may be caused by fungi, viruses, or bacteria. Bacteria are the most common cause of UTIs. This condition can also be caused by repeated incomplete emptying of the bladder during urination. What increases the risk? This condition is more likely to develop if:  You ignore your need to urinate or hold urine for long periods of time.  You do not empty your bladder completely during urination.  You wipe back to front after urinating or having a bowel movement, if you are female.  You are uncircumcised, if you are female.  You are constipated.  You have a urinary catheter that stays in place (indwelling).  You have a weak defense (immune) system.  You have a medical condition that affects your bowels, kidneys, or bladder.  You have diabetes.  You take antibiotic medicines frequently or for long periods of time, and the antibiotics no longer work well against certain types of infections (antibiotic resistance).  You take medicines that irritate your urinary tract.  You are exposed to chemicals that irritate your urinary tract.  You are female.  What are the signs or symptoms? Symptoms of this condition include:  Fever.  Frequent urination or passing small amounts of urine frequently.  Needing to urinate urgently.  Pain or burning with urination.  Urine that smells bad or unusual.  Cloudy urine.  Pain in the lower abdomen or back.  Trouble urinating.  Blood  in the urine.  Vomiting or being less hungry than normal.  Diarrhea or abdominal pain.  Vaginal discharge, if you are female.  How is this diagnosed? This condition is diagnosed with a medical history and physical exam. You will also need to provide a urine sample to test your urine. Other tests may be done, including:  Blood tests.  Sexually transmitted disease (STD) testing.  If you have had more than one UTI, a cystoscopy or imaging studies may be done to determine the cause of the infections. How is this treated? Treatment for this condition often includes a combination of two or more of the following:  Antibiotic medicine.  Other medicines to treat less common causes of UTI.  Over-the-counter medicines to treat pain.  Drinking enough water to stay hydrated.  Follow these instructions at home:  Take over-the-counter and prescription medicines only as told by your health care provider.  If you were prescribed an antibiotic, take it as told by your health care provider. Do not stop taking the antibiotic even if you start to feel better.  Avoid alcohol, caffeine, tea, and carbonated beverages. They can irritate your bladder.  Drink enough fluid to keep your urine clear or pale yellow.  Keep all follow-up visits as told by your health care provider. This is important.  Make sure to: ? Empty your bladder often and completely. Do not hold urine for long periods of time. ? Empty your bladder before and after sex. ? Wipe from front to back after a bowel movement if  you are female. Use each tissue one time when you wipe. Contact a health care provider if:  You have back pain.  You have a fever.  You feel nauseous or vomit.  Your symptoms do not get better after 3 days.  Your symptoms go away and then return. Get help right away if:  You have severe back pain or lower abdominal pain.  You are vomiting and cannot keep down any medicines or water. This information  is not intended to replace advice given to you by your health care provider. Make sure you discuss any questions you have with your health care provider. Document Released: 02/09/2005 Document Revised: 10/14/2015 Document Reviewed: 03/23/2015 Elsevier Interactive Patient Education  [employer].

## 2017-10-14 LAB — URINE CULTURE
MICRO NUMBER: 90646636
SPECIMEN QUALITY:: ADEQUATE

## 2018-06-01 ENCOUNTER — Telehealth: Payer: Self-pay | Admitting: Family Medicine

## 2018-06-01 ENCOUNTER — Encounter: Payer: Self-pay | Admitting: Family Medicine

## 2018-06-01 DIAGNOSIS — R7303 Prediabetes: Secondary | ICD-10-CM

## 2018-06-01 DIAGNOSIS — R739 Hyperglycemia, unspecified: Secondary | ICD-10-CM

## 2018-06-01 DIAGNOSIS — Z0000 Encounter for general adult medical examination without abnormal findings: Secondary | ICD-10-CM

## 2018-06-01 DIAGNOSIS — E7800 Pure hypercholesterolemia, unspecified: Secondary | ICD-10-CM

## 2018-06-01 NOTE — Telephone Encounter (Signed)
-----   Message from Lendon Collar, RT sent at 05/28/2018  9:17 AM EST ----- Regarding: Lab orders for Tuesday 06/05/18 Please enter CPE lab orders for 06/05/18. Thanks!

## 2018-06-04 ENCOUNTER — Other Ambulatory Visit: Payer: Self-pay | Admitting: Family Medicine

## 2018-06-04 DIAGNOSIS — Z1231 Encounter for screening mammogram for malignant neoplasm of breast: Secondary | ICD-10-CM

## 2018-06-05 ENCOUNTER — Other Ambulatory Visit (INDEPENDENT_AMBULATORY_CARE_PROVIDER_SITE_OTHER): Payer: No Typology Code available for payment source

## 2018-06-05 DIAGNOSIS — E7800 Pure hypercholesterolemia, unspecified: Secondary | ICD-10-CM | POA: Diagnosis not present

## 2018-06-05 DIAGNOSIS — R7303 Prediabetes: Secondary | ICD-10-CM | POA: Diagnosis not present

## 2018-06-05 DIAGNOSIS — Z0000 Encounter for general adult medical examination without abnormal findings: Secondary | ICD-10-CM

## 2018-06-05 LAB — CBC WITH DIFFERENTIAL/PLATELET
Basophils Absolute: 0 10*3/uL (ref 0.0–0.1)
Basophils Relative: 0.8 % (ref 0.0–3.0)
Eosinophils Absolute: 0.1 10*3/uL (ref 0.0–0.7)
Eosinophils Relative: 1.5 % (ref 0.0–5.0)
HCT: 41.4 % (ref 36.0–46.0)
Hemoglobin: 14.1 g/dL (ref 12.0–15.0)
LYMPHS PCT: 28.8 % (ref 12.0–46.0)
Lymphs Abs: 1.8 10*3/uL (ref 0.7–4.0)
MCHC: 34.1 g/dL (ref 30.0–36.0)
MCV: 81.3 fl (ref 78.0–100.0)
Monocytes Absolute: 0.4 10*3/uL (ref 0.1–1.0)
Monocytes Relative: 6 % (ref 3.0–12.0)
Neutro Abs: 4 10*3/uL (ref 1.4–7.7)
Neutrophils Relative %: 62.9 % (ref 43.0–77.0)
Platelets: 264 10*3/uL (ref 150.0–400.0)
RBC: 5.09 Mil/uL (ref 3.87–5.11)
RDW: 12.9 % (ref 11.5–15.5)
WBC: 6.3 10*3/uL (ref 4.0–10.5)

## 2018-06-05 LAB — COMPREHENSIVE METABOLIC PANEL
ALK PHOS: 67 U/L (ref 39–117)
ALT: 20 U/L (ref 0–35)
AST: 19 U/L (ref 0–37)
Albumin: 4.3 g/dL (ref 3.5–5.2)
BILIRUBIN TOTAL: 0.4 mg/dL (ref 0.2–1.2)
BUN: 12 mg/dL (ref 6–23)
CO2: 28 meq/L (ref 19–32)
Calcium: 9.2 mg/dL (ref 8.4–10.5)
Chloride: 105 mEq/L (ref 96–112)
Creatinine, Ser: 0.72 mg/dL (ref 0.40–1.20)
GFR: 100.94 mL/min (ref >60.00)
GLUCOSE: 100 mg/dL — AB (ref 70–99)
POTASSIUM: 3.8 meq/L (ref 3.5–5.1)
SODIUM: 139 meq/L (ref 135–145)
TOTAL PROTEIN: 7.2 g/dL (ref 6.0–8.3)

## 2018-06-05 LAB — LIPID PANEL
CHOL/HDL RATIO: 4
Cholesterol: 179 mg/dL (ref 0–200)
HDL: 40.4 mg/dL (ref >39.00)
LDL Cholesterol: 117 mg/dL — ABNORMAL HIGH (ref 0–99)
NONHDL: 138.9
Triglycerides: 112 mg/dL (ref 0.0–149.0)
VLDL: 22.4 mg/dL (ref 0.0–40.0)

## 2018-06-05 LAB — HEMOGLOBIN A1C: HEMOGLOBIN A1C: 6 % (ref 4.6–6.5)

## 2018-06-05 LAB — TSH: TSH: 2.86 u[IU]/mL (ref 0.35–4.50)

## 2018-06-11 ENCOUNTER — Encounter: Payer: Self-pay | Admitting: Family Medicine

## 2018-06-11 ENCOUNTER — Ambulatory Visit (INDEPENDENT_AMBULATORY_CARE_PROVIDER_SITE_OTHER): Payer: No Typology Code available for payment source | Admitting: Family Medicine

## 2018-06-11 ENCOUNTER — Other Ambulatory Visit (HOSPITAL_COMMUNITY)
Admission: RE | Admit: 2018-06-11 | Discharge: 2018-06-11 | Disposition: A | Discharge status: Home / Self Care | Payer: No Typology Code available for payment source | Source: Ambulatory Visit | Attending: Family Medicine | Admitting: Family Medicine

## 2018-06-11 VITALS — BP 128/78 | HR 69 | Temp 98.1°F | Ht 62.75 in | Wt 203.0 lb

## 2018-06-11 DIAGNOSIS — Z01419 Encounter for gynecological examination (general) (routine) without abnormal findings: Secondary | ICD-10-CM | POA: Insufficient documentation

## 2018-06-11 DIAGNOSIS — Z0000 Encounter for general adult medical examination without abnormal findings: Secondary | ICD-10-CM

## 2018-06-11 DIAGNOSIS — R7303 Prediabetes: Secondary | ICD-10-CM

## 2018-06-11 DIAGNOSIS — E7800 Pure hypercholesterolemia, unspecified: Secondary | ICD-10-CM

## 2018-06-11 DIAGNOSIS — Z6836 Body mass index (BMI) 36.0-36.9, adult: Secondary | ICD-10-CM

## 2018-06-11 DIAGNOSIS — E6609 Other obesity due to excess calories: Secondary | ICD-10-CM

## 2018-06-11 NOTE — Assessment & Plan Note (Signed)
Routine exam  Did not note fibroids this exam  Pap pending No c/o

## 2018-06-11 NOTE — Patient Instructions (Addendum)
For weight loss and diabetes prevention Try to get most of your carbohydrates from produce (with the exception of white potatoes)  Eat less bread/pasta/rice/snack foods/cereals/sweets and other items from the middle of the grocery store (processed carbs)   Try to get 1200-1500 mg of calcium per day with at least 1000 iu of vitamin D - for bone health  If this constipates- just take the D   Keep exercising

## 2018-06-11 NOTE — Progress Notes (Addendum)
Subjective:    Patient ID: Victoria [name], female    DOB: [birth year], 58 y.o.   MRN: [id number]  HPI Here for health maintenance exam and to review chronic medical problems    Had a head cold /got better last week   Wt Readings from Last 3 Encounters:  06/11/18 203 lb (92.1 kg)  10/11/17 203 lb 12 oz (92.4 kg)  02/22/17 204 lb 8 oz (92.8 kg)  fair care of self  Eating healthy  Exercise- walking at least 30 min per day/ out or treadmill  36.25 kg/m   Pap 4/16 Neg with neg HPV Will do today Done with menses  A lot of hot flashes (can get through the day)   Mammogram 2/19 -has it scheduled for next mo  Self breast exam - no breast lumps  Has a tag mole that is bothering her   Colonoscopy 3/13 - with 10 y recall   Tetanus shot 4/16 Does not get flu shots   Zoster status -not interested in vaccine   Hyperlipidemia Lab Results  Component Value Date   CHOL 179 06/05/2018   CHOL 175 02/20/2017   CHOL 212 (H) 05/24/2016   Lab Results  Component Value Date   HDL 40.40 06/05/2018   HDL 37.30 (L) 02/20/2017   HDL 42.50 05/24/2016   Lab Results  Component Value Date   LDLCALC 117 (H) 06/05/2018   LDLCALC 124 (H) 02/20/2017   LDLCALC 150 (H) 05/24/2016   Lab Results  Component Value Date   TRIG 112.0 06/05/2018   TRIG 73.0 02/20/2017   TRIG 99.0 05/24/2016   Lab Results  Component Value Date   CHOLHDL 4 06/05/2018   CHOLHDL 5 02/20/2017   CHOLHDL 5 05/24/2016   No results found for: LDLDIRECT Watching her diet  LDL is improved So is HDL  Avoids red meat /does not fry either   Pre diabetes Lab Results  Component Value Date   HGBA1C 6.0 06/05/2018  no change  Glucose 100  Does not eat pasta  Some bread  Lab Results  Component Value Date   WBC 6.3 06/05/2018   HGB 14.1 06/05/2018   HCT 41.4 06/05/2018   MCV 81.3 06/05/2018   PLT 264.0 06/05/2018   Lab Results  Component Value Date   CREATININE 0.72 06/05/2018   BUN 12 06/05/2018   NA 139  06/05/2018   K 3.8 06/05/2018   CL 105 06/05/2018   CO2 28 06/05/2018   Lab Results  Component Value Date   ALT 20 06/05/2018   AST 19 06/05/2018   ALKPHOS 67 06/05/2018   BILITOT 0.4 06/05/2018    Lab Results  Component Value Date   TSH 2.86 06/05/2018    Not taking ca or D for bones   Patient Active Problem List   Diagnosis Date Noted  . Prediabetes 06/01/2018  . Panic attacks 02/22/2016  . Encounter for routine gynecological examination 09/02/2014  . Special screening for malignant neoplasms, colon 06/08/2011  . Hyperlipidemia 06/08/2011  . Routine general medical examination at a health care facility 06/01/2011  . LIPOMA 12/30/2009  . FIBROIDS, UTERUS 04/27/2009  . Obesity 03/19/2007  . ALLERGIC RHINITIS 02/27/2007   Past Medical History:  Diagnosis Date  . Allergic rhinitis   . Anemia, iron deficiency   . Heavy menses   . Hyperglycemia 02/22/2016  . Lipoma    on L arm  . Uterine fibroid    Past Surgical History:  Procedure Laterality Date  .  CESAREAN SECTION    . CHOLECYSTECTOMY     11/07/11  . ENDOMETRIAL ABLATION     fibroids  . Pelvic U/S and transvaginal  04/27/2009   fibroids x 2  . TONSILLECTOMY    . TUBAL LIGATION    . Uterine ultrasound  06/1998   and endo biopsy- neg   Social History   Tobacco Use  . Smoking status: Never Smoker  . Smokeless tobacco: Never Used  Substance Use Topics  . Alcohol use: No    Alcohol/week: 0.0 standard drinks  . Drug use: No   Family History  Problem Relation Age of Onset  . Diabetes Mother   . Hypertension Mother   . Hyperlipidemia Mother   . Cancer Mother        uterine cancer  . Lung cancer Mother        died 65  . Diabetes Father   . Alcohol abuse Father   . Alcohol abuse Sister   . Hypertension Sister   . Diabetes Sister        boarderline  . Alcohol abuse Brother   . Hypertension Brother   . Diabetes Maternal Grandmother   . Alcohol abuse Paternal Grandfather   . Alcohol abuse Brother    . Alcohol abuse Brother   . Alcohol abuse Brother   . Diabetes Sister        severe  . Colon cancer Neg Hx    Allergies  Allergen Reactions  . Codeine     REACTION: nausea and vomiting  . Doxycycline Nausea Only   No current outpatient medications on file prior to visit.   No current facility-administered medications on file prior to visit.     Review of Systems  Constitutional: Negative for activity change, appetite change, fatigue, fever and unexpected weight change.  HENT: Negative for congestion, ear pain, rhinorrhea, sinus pressure and sore throat.   Eyes: Negative for pain, redness and visual disturbance.  Respiratory: Negative for cough, shortness of breath and wheezing.   Cardiovascular: Negative for chest pain and palpitations.  Gastrointestinal: Negative for abdominal pain, blood in stool, constipation and diarrhea.  Endocrine: Negative for polydipsia and polyuria.  Genitourinary: Negative for dysuria, frequency and urgency.  Musculoskeletal: Negative for arthralgias, back pain and myalgias.  Skin: Negative for pallor and rash.       Bothersome skin tags neck/chest  Allergic/Immunologic: Negative for environmental allergies.  Neurological: Negative for dizziness, syncope and headaches.  Hematological: Negative for adenopathy. Does not bruise/bleed easily.  Psychiatric/Behavioral: Negative for decreased concentration and dysphoric mood. The patient is not nervous/anxious.        Objective:   Physical Exam Constitutional:      General: She is not in acute distress.    Appearance: Normal appearance. She is well-developed. She is obese. She is not ill-appearing.  HENT:     Head: Normocephalic and atraumatic.     Right Ear: Tympanic membrane, ear canal and external ear normal.     Left Ear: Tympanic membrane, ear canal and external ear normal.     Nose: Nose normal.     Mouth/Throat:     Mouth: Mucous membranes are moist.     Pharynx: Oropharynx is clear.  Eyes:      General: No scleral icterus.    Conjunctiva/sclera: Conjunctivae normal.     Pupils: Pupils are equal, round, and reactive to light.  Neck:     Musculoskeletal: Normal range of motion and neck supple.     Thyroid:  No thyromegaly.     Vascular: No carotid bruit or JVD.  Cardiovascular:     Rate and Rhythm: Normal rate and regular rhythm.     Pulses: Normal pulses.     Heart sounds: Normal heart sounds. No murmur. No gallop.   Pulmonary:     Effort: Pulmonary effort is normal. No respiratory distress.     Breath sounds: Normal breath sounds. No wheezing.  Chest:     Chest wall: No tenderness.  Abdominal:     General: Bowel sounds are normal. There is no distension or abdominal bruit.     Palpations: Abdomen is soft. There is no mass.     Tenderness: There is no abdominal tenderness.  Genitourinary:    Comments: Breast exam: No mass, nodules, thickening, tenderness, bulging, retraction, inflamation, nipple discharge or skin changes noted.  No axillary or clavicular LA.             Anus appears normal w/o hemorrhoids or masses     External genitalia : nl appearance and hair distribution/no lesions     Urethral meatus : nl size, no lesions or prolapse     Urethra: no masses, tenderness or scarring    Bladder : no masses or tenderness     Vagina: nl general appearance, no discharge or  Lesions, no significant cystocele  or rectocele     Cervix: no lesions/ discharge or friability    Uterus: nl size, contour, position, and mobility (not fixed) , non tender    Adnexa : no masses, tenderness, enlargement or nodularity       Musculoskeletal: Normal range of motion.        General: No tenderness.     Right lower leg: No edema.     Left lower leg: No edema.  Lymphadenopathy:     Cervical: No cervical adenopathy.  Skin:    General: Skin is warm and dry.     Capillary Refill: Capillary refill takes less than 2 seconds.     Coloration: Skin is not pale.      Findings: No erythema or rash.     Comments: Multiple skin tags on chest and neck  Largest one on R upper chest   Neurological:     General: No focal deficit present.     Mental Status: She is alert.     Cranial Nerves: No cranial nerve deficit.     Motor: No abnormal muscle tone.     Coordination: Coordination normal.     Deep Tendon Reflexes: Reflexes normal.  Psychiatric:        Mood and Affect: Mood normal.           Assessment & Plan:   Problem List Items Addressed This Visit      Other   Obesity    Discussed how this problem influences overall health and the risks it imposes  Reviewed plan for weight loss with lower calorie diet (via better food choices and also portion control or program like weight watchers) and exercise building up to or more than 30 minutes 5 days per week including some aerobic activity         Routine general medical examination at a health care facility - Primary    .Reviewed health habits including diet and exercise and skin cancer prevention Reviewed appropriate screening tests for age  Also reviewed health mt list, fam hx and immunization status , as well as social and family history   See HPI Labs rev  Gyn exam done as well as pap Disc plan for wt loss and DM prevention  Disc ca and D for bone health Pt declines flu shot unfortunately      Hyperlipidemia    Disc goals for lipids and reasons to control them Rev last labs with pt Rev low sat fat diet in detail Enc to keep eating well  LDL and HDL are improved       Encounter for routine gynecological examination    Routine exam  Did not note fibroids this exam  Pap pending No c/o      Relevant Orders   Cytology - PAP([place])   Prediabetes    Lab Results  Component Value Date   HGBA1C 6.0 06/05/2018   This is stable  disc imp of low glycemic diet and wt loss to prevent DM2

## 2018-06-11 NOTE — Assessment & Plan Note (Signed)
Lab Results  Component Value Date   HGBA1C 6.0 06/05/2018   This is stable  disc imp of low glycemic diet and wt loss to prevent DM2

## 2018-06-11 NOTE — Assessment & Plan Note (Signed)
Discussed how this problem influences overall health and the risks it imposes  Reviewed plan for weight loss with lower calorie diet (via better food choices and also portion control or program like weight watchers) and exercise building up to or more than 30 minutes 5 days per week including some aerobic activity    

## 2018-06-11 NOTE — Assessment & Plan Note (Signed)
Disc goals for lipids and reasons to control them Rev last labs with pt Rev low sat fat diet in detail Enc to keep eating well  LDL and HDL are improved

## 2018-06-11 NOTE — Assessment & Plan Note (Signed)
.  Reviewed health habits including diet and exercise and skin cancer prevention Reviewed appropriate screening tests for age  Also reviewed health mt list, fam hx and immunization status , as well as social and family history   See HPI Labs rev  Gyn exam done as well as pap Disc plan for wt loss and DM prevention  Disc ca and D for bone health Pt declines flu shot unfortunately

## 2018-06-12 LAB — CYTOLOGY - PAP: Diagnosis: NEGATIVE

## 2018-07-10 ENCOUNTER — Ambulatory Visit
Admission: RE | Admit: 2018-07-10 | Discharge: 2018-07-10 | Disposition: A | Discharge status: Home / Self Care | Payer: No Typology Code available for payment source | Source: Ambulatory Visit | Attending: Family Medicine | Admitting: Family Medicine

## 2018-07-10 DIAGNOSIS — Z1231 Encounter for screening mammogram for malignant neoplasm of breast: Secondary | ICD-10-CM

## 2018-08-28 ENCOUNTER — Encounter: Payer: Self-pay | Admitting: Emergency Medicine

## 2018-08-28 ENCOUNTER — Other Ambulatory Visit: Payer: Self-pay

## 2018-08-28 ENCOUNTER — Encounter: Payer: Self-pay | Admitting: Family Medicine

## 2018-08-28 ENCOUNTER — Ambulatory Visit (INDEPENDENT_AMBULATORY_CARE_PROVIDER_SITE_OTHER): Payer: No Typology Code available for payment source | Admitting: Family Medicine

## 2018-08-28 ENCOUNTER — Inpatient Hospital Stay
Admission: EM | Admit: 2018-08-28 | Discharge: 2018-09-01 | DRG: 390 | Disposition: A | Discharge status: Home / Self Care | Payer: No Typology Code available for payment source | Attending: Surgery | Admitting: Surgery

## 2018-08-28 ENCOUNTER — Emergency Department: Payer: No Typology Code available for payment source

## 2018-08-28 ENCOUNTER — Inpatient Hospital Stay: Payer: No Typology Code available for payment source

## 2018-08-28 ENCOUNTER — Telehealth: Payer: Self-pay

## 2018-08-28 DIAGNOSIS — Z885 Allergy status to narcotic agent status: Secondary | ICD-10-CM

## 2018-08-28 DIAGNOSIS — E785 Hyperlipidemia, unspecified: Secondary | ICD-10-CM | POA: Diagnosis present

## 2018-08-28 DIAGNOSIS — Z79899 Other long term (current) drug therapy: Secondary | ICD-10-CM

## 2018-08-28 DIAGNOSIS — K56609 Unspecified intestinal obstruction, unspecified as to partial versus complete obstruction: Secondary | ICD-10-CM | POA: Diagnosis present

## 2018-08-28 DIAGNOSIS — Z8249 Family history of ischemic heart disease and other diseases of the circulatory system: Secondary | ICD-10-CM

## 2018-08-28 DIAGNOSIS — Z4659 Encounter for fitting and adjustment of other gastrointestinal appliance and device: Secondary | ICD-10-CM

## 2018-08-28 DIAGNOSIS — D259 Leiomyoma of uterus, unspecified: Secondary | ICD-10-CM | POA: Diagnosis present

## 2018-08-28 DIAGNOSIS — R1013 Epigastric pain: Secondary | ICD-10-CM

## 2018-08-28 DIAGNOSIS — K5651 Intestinal adhesions [bands], with partial obstruction: Secondary | ICD-10-CM | POA: Diagnosis present

## 2018-08-28 DIAGNOSIS — Z881 Allergy status to other antibiotic agents status: Secondary | ICD-10-CM | POA: Diagnosis not present

## 2018-08-28 DIAGNOSIS — R112 Nausea with vomiting, unspecified: Secondary | ICD-10-CM | POA: Diagnosis not present

## 2018-08-28 DIAGNOSIS — Z801 Family history of malignant neoplasm of trachea, bronchus and lung: Secondary | ICD-10-CM

## 2018-08-28 DIAGNOSIS — Z6835 Body mass index (BMI) 35.0-35.9, adult: Secondary | ICD-10-CM | POA: Diagnosis not present

## 2018-08-28 DIAGNOSIS — Z8349 Family history of other endocrine, nutritional and metabolic diseases: Secondary | ICD-10-CM

## 2018-08-28 DIAGNOSIS — D179 Benign lipomatous neoplasm, unspecified: Secondary | ICD-10-CM | POA: Diagnosis present

## 2018-08-28 DIAGNOSIS — Z833 Family history of diabetes mellitus: Secondary | ICD-10-CM

## 2018-08-28 DIAGNOSIS — R7303 Prediabetes: Secondary | ICD-10-CM | POA: Diagnosis present

## 2018-08-28 DIAGNOSIS — Z8719 Personal history of other diseases of the digestive system: Secondary | ICD-10-CM | POA: Diagnosis present

## 2018-08-28 DIAGNOSIS — E669 Obesity, unspecified: Secondary | ICD-10-CM | POA: Diagnosis present

## 2018-08-28 DIAGNOSIS — K760 Fatty (change of) liver, not elsewhere classified: Secondary | ICD-10-CM | POA: Diagnosis present

## 2018-08-28 DIAGNOSIS — Z8049 Family history of malignant neoplasm of other genital organs: Secondary | ICD-10-CM | POA: Diagnosis not present

## 2018-08-28 DIAGNOSIS — M47816 Spondylosis without myelopathy or radiculopathy, lumbar region: Secondary | ICD-10-CM | POA: Diagnosis present

## 2018-08-28 DIAGNOSIS — Z811 Family history of alcohol abuse and dependence: Secondary | ICD-10-CM

## 2018-08-28 DIAGNOSIS — D509 Iron deficiency anemia, unspecified: Secondary | ICD-10-CM | POA: Diagnosis present

## 2018-08-28 DIAGNOSIS — K56600 Partial intestinal obstruction, unspecified as to cause: Secondary | ICD-10-CM | POA: Diagnosis present

## 2018-08-28 DIAGNOSIS — N920 Excessive and frequent menstruation with regular cycle: Secondary | ICD-10-CM | POA: Diagnosis present

## 2018-08-28 LAB — CBC
HCT: 46.9 % — ABNORMAL HIGH (ref 36.0–46.0)
Hemoglobin: 15.9 g/dL — ABNORMAL HIGH (ref 12.0–15.0)
MCH: 27.6 pg (ref 26.0–34.0)
MCHC: 33.9 g/dL (ref 30.0–36.0)
MCV: 81.3 fL (ref 80.0–100.0)
Platelets: 384 10*3/uL (ref 150–400)
RBC: 5.77 MIL/uL — ABNORMAL HIGH (ref 3.87–5.11)
RDW: 12.6 % (ref 11.5–15.5)
WBC: 14.3 10*3/uL — ABNORMAL HIGH (ref 4.0–10.5)
nRBC: 0 % (ref 0.0–0.2)

## 2018-08-28 LAB — COMPREHENSIVE METABOLIC PANEL
ALT: 23 U/L (ref 0–44)
AST: 20 U/L (ref 15–41)
Albumin: 4.9 g/dL (ref 3.5–5.0)
Alkaline Phosphatase: 82 U/L (ref 38–126)
Anion gap: 15 (ref 5–15)
BUN: 13 mg/dL (ref 6–20)
CO2: 24 mmol/L (ref 22–32)
Calcium: 9.7 mg/dL (ref 8.9–10.3)
Chloride: 99 mmol/L (ref 98–111)
Creatinine, Ser: 0.74 mg/dL (ref 0.44–1.00)
GFR calc Af Amer: >60 mL/min (ref >60)
GFR calc non Af Amer: >60 mL/min (ref >60)
Glucose, Bld: 164 mg/dL — ABNORMAL HIGH (ref 70–99)
Potassium: 3.7 mmol/L (ref 3.5–5.1)
Sodium: 138 mmol/L (ref 135–145)
Total Bilirubin: 0.4 mg/dL (ref 0.3–1.2)
Total Protein: 8.6 g/dL — ABNORMAL HIGH (ref 6.5–8.1)

## 2018-08-28 LAB — TROPONIN I: Troponin I: <0.03 ng/mL (ref <0.03)

## 2018-08-28 LAB — LIPASE, BLOOD: Lipase: 34 U/L (ref 11–51)

## 2018-08-28 MED ORDER — ONDANSETRON 4 MG PO TBDP
ORAL_TABLET | ORAL | Status: AC
  Filled 2018-08-28: qty 1

## 2018-08-28 MED ORDER — ACETAMINOPHEN 650 MG RE SUPP: 650.0000 mg | Freq: Four times a day (QID) | RECTAL | Status: DC | PRN

## 2018-08-28 MED ORDER — SODIUM CHLORIDE 0.9 % IV BOLUS
1000.0000 mL | Freq: Once | INTRAVENOUS | Status: AC
  Administered 2018-08-28: 1000 mL via INTRAVENOUS

## 2018-08-28 MED ORDER — ONDANSETRON HCL 4 MG PO TABS: 4.0000 mg | ORAL_TABLET | Freq: Four times a day (QID) | ORAL | Status: DC | PRN

## 2018-08-28 MED ORDER — MORPHINE SULFATE (PF) 2 MG/ML IV SOLN
1.0000 mg | INTRAVENOUS | Status: DC | PRN
  Administered 2018-08-28: 2 mg via INTRAVENOUS

## 2018-08-28 MED ORDER — ACETAMINOPHEN 325 MG PO TABS: 650.0000 mg | ORAL_TABLET | Freq: Four times a day (QID) | ORAL | Status: DC | PRN

## 2018-08-28 MED ORDER — IOHEXOL 300 MG/ML  SOLN
100.0000 mL | Freq: Once | INTRAMUSCULAR | Status: AC | PRN
  Administered 2018-08-28: 100 mL via INTRAVENOUS

## 2018-08-28 MED ORDER — ONDANSETRON 4 MG PO TBDP
4.0000 mg | ORAL_TABLET | Freq: Once | ORAL | Status: AC | PRN
  Administered 2018-08-28: 4 mg via ORAL

## 2018-08-28 MED ORDER — PROMETHAZINE HCL 25 MG PO TABS: 25.0000 mg | ORAL_TABLET | Freq: Three times a day (TID) | ORAL | 0 refills | Status: DC | PRN

## 2018-08-28 MED ORDER — LIDOCAINE HCL URETHRAL/MUCOSAL 2 % EX GEL: 1.0000 "application " | Freq: Once | CUTANEOUS | Status: DC

## 2018-08-28 MED ORDER — MORPHINE SULFATE (PF) 2 MG/ML IV SOLN
INTRAVENOUS | Status: AC
  Administered 2018-08-28: 2 mg via INTRAVENOUS
  Filled 2018-08-28: qty 1

## 2018-08-28 MED ORDER — KETOROLAC TROMETHAMINE 30 MG/ML IJ SOLN
15.0000 mg | INTRAMUSCULAR | Status: AC
  Administered 2018-08-28: 15 mg via INTRAVENOUS
  Filled 2018-08-28: qty 1

## 2018-08-28 MED ORDER — ENOXAPARIN SODIUM 40 MG/0.4ML ~~LOC~~ SOLN
40.0000 mg | SUBCUTANEOUS | Status: DC
  Administered 2018-08-28 – 2018-08-31 (×4): 40 mg via SUBCUTANEOUS
  Filled 2018-08-28 (×4): qty 0.4

## 2018-08-28 MED ORDER — PANTOPRAZOLE SODIUM 40 MG IV SOLR
40.0000 mg | Freq: Once | INTRAVENOUS | Status: AC
  Administered 2018-08-28: 40 mg via INTRAVENOUS
  Filled 2018-08-28: qty 40

## 2018-08-28 MED ORDER — SODIUM CHLORIDE 0.9 % IV SOLN
INTRAVENOUS | Status: DC
  Administered 2018-08-28 – 2018-08-29 (×2): via INTRAVENOUS

## 2018-08-28 MED ORDER — ONDANSETRON HCL 4 MG/2ML IJ SOLN
4.0000 mg | Freq: Four times a day (QID) | INTRAMUSCULAR | Status: DC | PRN
  Administered 2018-08-28 – 2018-08-29 (×2): 4 mg via INTRAVENOUS
  Filled 2018-08-28 (×2): qty 2

## 2018-08-28 NOTE — ED Triage Notes (Signed)
Here for epigastric/lower chest pain.  Pt reports constant and started yesterday. Describes as cramping. Multiple episodes of vomiting.  +chills. No diarrhea. Gallbladder has been removed.  No known fevers.

## 2018-08-28 NOTE — ED Provider Notes (Signed)
[facility] Emergency Department Provider Note  ____________________________________________  Time seen: Approximately 6:42 PM  I have reviewed the triage vital signs and the nursing notes.   HISTORY  Chief Complaint Abdominal Pain    HPI Victoria [name] is a 58 y.o. female with a history of anemia, hyperglycemia, uterine fibroid who comes the ED complaining of epigastric pain that started yesterday morning, gradual onset, constant, worsening, sharp and cramping, currently severe.  Unable to eat or drink due to vomiting.  Denies diarrhea or constipation, no fever sweats chills or body aches.      Past Medical History:  Diagnosis Date  . Allergic rhinitis   . Anemia, iron deficiency   . Heavy menses   . Hyperglycemia 02/22/2016  . Lipoma    on L arm  . Uterine fibroid      Patient Active Problem List   Diagnosis Date Noted  . Nausea & vomiting 08/28/2018  . SBO (small bowel obstruction) ([place]) 08/28/2018  . Prediabetes 06/01/2018  . Panic attacks 02/22/2016  . Encounter for routine gynecological examination 09/02/2014  . Special screening for malignant neoplasms, colon 06/08/2011  . Hyperlipidemia 06/08/2011  . Routine general medical examination at a health care facility 06/01/2011  . LIPOMA 12/30/2009  . FIBROIDS, UTERUS 04/27/2009  . Obesity 03/19/2007  . ALLERGIC RHINITIS 02/27/2007     Past Surgical History:  Procedure Laterality Date  . CESAREAN SECTION    . CHOLECYSTECTOMY     11/07/11  . ENDOMETRIAL ABLATION     fibroids  . Pelvic U/S and transvaginal  04/27/2009   fibroids x 2  . TONSILLECTOMY    . TUBAL LIGATION    . Uterine ultrasound  06/1998   and endo biopsy- neg     Prior to Admission medications   Medication Sig Start Date End Date Taking? Authorizing Provider  promethazine (PHENERGAN) 25 MG tablet Take 1 tablet (25 mg total) by mouth every 8 (eight) hours as needed for nausea or vomiting. 08/28/18   Tower, Wynelle Fanny,  MD     Allergies Codeine and Doxycycline   Family History  Problem Relation Age of Onset  . Diabetes Mother   . Hypertension Mother   . Hyperlipidemia Mother   . Cancer Mother        uterine cancer  . Lung cancer Mother        died 78  . Diabetes Father   . Alcohol abuse Father   . Alcohol abuse Sister   . Hypertension Sister   . Diabetes Sister        boarderline  . Alcohol abuse Brother   . Hypertension Brother   . Diabetes Maternal Grandmother   . Alcohol abuse Paternal Grandfather   . Alcohol abuse Brother   . Alcohol abuse Brother   . Alcohol abuse Brother   . Diabetes Sister        severe  . Colon cancer Neg Hx   . Breast cancer Neg Hx     Social History Social History   Tobacco Use  . Smoking status: Never Smoker  . Smokeless tobacco: Never Used  Substance Use Topics  . Alcohol use: No    Alcohol/week: 0.0 standard drinks  . Drug use: No    Review of Systems  Constitutional:   No fever or chills.  ENT:   No sore throat. No rhinorrhea. Cardiovascular:   No chest pain or syncope. Respiratory:   No dyspnea or cough. Gastrointestinal: Positive as above for  abdominal pain and vomiting Musculoskeletal:   Negative for focal pain or swelling All other systems reviewed and are negative except as documented above in ROS and HPI.  ____________________________________________   PHYSICAL EXAM:  VITAL SIGNS: ED Triage Vitals  Enc Vitals Group     BP 08/28/18 1509 (!) 144/63     Pulse Rate 08/28/18 1509 99     Resp 08/28/18 1509 20     Temp 08/28/18 1509 98.5 F (36.9 C)     Temp Source 08/28/18 1509 Oral     SpO2 08/28/18 1509 96 %     Weight 08/28/18 1505 200 lb (90.7 kg)     Height 08/28/18 1505 5\' 3"  (1.6 m)     Head Circumference --      Peak Flow --      Pain Score 08/28/18 1504 10     Pain Loc --      Pain Edu? --      Excl. in [place]? --     Vital signs reviewed, nursing assessments reviewed.   Constitutional:   Alert and oriented.  Non-toxic appearance. Eyes:   Conjunctivae are normal. EOMI. PERRL. ENT      Head:   Normocephalic and atraumatic.      Nose:   No congestion/rhinnorhea.       Mouth/Throat:   MMM, no pharyngeal erythema. No peritonsillar mass.       Neck:   No meningismus. Full ROM. Hematological/Lymphatic/Immunilogical:   No cervical lymphadenopathy. Cardiovascular:   RRR. Symmetric bilateral radial and DP pulses.  No murmurs. Cap refill less than 2 seconds. Respiratory:   Normal respiratory effort without tachypnea/retractions. Breath sounds are clear and equal bilaterally. No wheezes/rales/rhonchi. Gastrointestinal:   Soft with epigastric tenderness.  Non distended. There is no CVA tenderness.  No rebound, rigidity, or guarding. Musculoskeletal:   Normal range of motion in all extremities. No joint effusions.  No lower extremity tenderness.  No edema. Neurologic:   Normal speech and language.  Motor grossly intact. No acute focal neurologic deficits are appreciated.  Skin:    Skin is warm, dry and intact. No rash noted.  No petechiae, purpura, or bullae.  ____________________________________________    LABS (pertinent positives/negatives) (all labs ordered are listed, but only abnormal results are displayed) Labs Reviewed  COMPREHENSIVE METABOLIC PANEL - Abnormal; Notable for the following components:      Result Value   Glucose, Bld 164 (*)    Total Protein 8.6 (*)    All other components within normal limits  CBC - Abnormal; Notable for the following components:   WBC 14.3 (*)    RBC 5.77 (*)    Hemoglobin 15.9 (*)    HCT 46.9 (*)    All other components within normal limits  LIPASE, BLOOD  TROPONIN I  URINALYSIS, COMPLETE (UACMP) WITH MICROSCOPIC   ____________________________________________   EKG  Interpreted by me Normal sinus rhythm rate of 98, normal axis and intervals.  Normal QRS ST segments and T waves.  ____________________________________________    RADIOLOGY  Ct  Abdomen Pelvis W Contrast  Result Date: 08/28/2018 CLINICAL DATA:  Two day history of epigastric pain EXAM: CT ABDOMEN AND PELVIS WITH CONTRAST TECHNIQUE: Multidetector CT imaging of the abdomen and pelvis was performed using the standard protocol following bolus administration of intravenous contrast. CONTRAST:  156mL OMNIPAQUE IOHEXOL 300 MG/ML  SOLN COMPARISON:  None. FINDINGS: Lower chest: No acute abnormality. Hepatobiliary: Mild fatty infiltration of the liver is noted. The gallbladder has been surgically  removed. No biliary ductal dilatation is noted. Pancreas: Unremarkable. No pancreatic ductal dilatation or surrounding inflammatory changes. Spleen: Normal in size without focal abnormality. Adrenals/Urinary Tract: Adrenal glands are within normal limits. Kidneys are well visualize within normal enhancement pattern. No renal calculi or obstructive changes are noted. The bladder is partially distended. Stomach/Bowel: Scattered diverticular change of the colon is noted. No evidence of diverticulitis is seen. Dilatation of the jejunum and proximal ileum is noted with fecalization of small bowel contents consistent with delayed transition time and at least partial small bowel obstruction. Transition zone is noted in the left mid abdomen in what appears to be the mid to distal ileum. Some hyperemia is noted in this region although no definitive mass is seen. Vascular/Lymphatic: No significant vascular findings are present. No enlarged abdominal or pelvic lymph nodes. Reproductive: Bulky uterus is noted with scattered calcifications consistent with uterine fibroids. Other: Small amount of free fluid is noted within the pelvis likely reactive in nature. Musculoskeletal: Degenerative changes of lumbar spine are noted. IMPRESSION: Small-bowel dilatation involving primarily the jejunum and proximal ileum with a transition zone in the left mid abdomen consistent with partial small bowel obstruction. No definitive mass  lesion is seen. These changes raise suspicion for inflammatory bowel disease. No surgical changes to suggest adhesions are seen. Fatty liver. Uterine fibroids. Electronically Signed   By: Inez Catalina M.D.   On: 08/28/2018 19:19    ____________________________________________   PROCEDURES Procedures  ____________________________________________  DIFFERENTIAL DIAGNOSIS   Gastritis, GERD, pancreatitis, choledocholithiasis, enteritis, bowel obstruction  CLINICAL IMPRESSION / ASSESSMENT AND PLAN / ED COURSE  Medications ordered in the ED: Medications  morphine 2 MG/ML injection 1-2 mg (has no administration in time range)  morphine 2 MG/ML injection (has no administration in time range)  ondansetron (ZOFRAN-ODT) disintegrating tablet 4 mg (4 mg Oral Given 08/28/18 1512)  sodium chloride 0.9 % bolus 1,000 mL (0 mLs Intravenous Stopped 08/28/18 1933)  ketorolac (TORADOL) 30 MG/ML injection 15 mg (15 mg Intravenous Given 08/28/18 1838)  pantoprazole (PROTONIX) injection 40 mg (40 mg Intravenous Given 08/28/18 1837)  iohexol (OMNIPAQUE) 300 MG/ML solution 100 mL (100 mLs Intravenous Contrast Given 08/28/18 1824)    Pertinent labs & imaging results that were available during my care of the patient were reviewed by me and considered in my medical decision making (see chart for details).  MORENIKE CUFF was evaluated in Emergency Department on 08/28/2018 for the symptoms described in the history of present illness. She was evaluated in the context of the global COVID-19 pandemic, which necessitated consideration that the patient might be at risk for infection with the SARS-CoV-2 virus that causes COVID-19. Institutional protocols and algorithms that pertain to the evaluation of patients at risk for COVID-19 are in a state of rapid change based on information released by regulatory bodies including the CDC and federal and state organizations. These policies and algorithms were followed during the  patient's care in the ED.   Patient presents with severe epigastric pain, constant for the past 36 hours.  Tenderness on exam.  Labs show a leukocytosis of 14,000.  I will give Toradol Protonix and IV fluids for symptom relief, obtain CT scan of the abdomen and pelvis to further evaluate relatively broad differential.  ----------------------------------------- 10:18 PM on 08/28/2018 -----------------------------------------  CT scan reveals small bowel obstruction of the proximal ileum.  Patient has ongoing pain, will need NG tube decompression and further work-up of underlying cause.  No evidence of perforation abscess formation or  pancreatitis.      ____________________________________________   FINAL CLINICAL IMPRESSION(S) / ED DIAGNOSES    Final diagnoses:  Small bowel obstruction ([place])  Epigastric pain     ED Discharge Orders    None      Portions of this note were generated with dragon dictation software. Dictation errors may occur despite best attempts at proofreading.   Carrie Mew, MD 08/28/18 2219

## 2018-08-28 NOTE — Telephone Encounter (Signed)
Inbound call to triage - patient has report of nausea and hematemesis. Headache resolved. Denies diarrhea. Fever??? (has not taken temp). Knot in top of abdomen.   Patient encouraged to seek treatment in ED but refused.   Virtual appt scheduled with PCP on 08/28/18 @ 1045.

## 2018-08-28 NOTE — H&P (Signed)
[facility] NAME: Victoria [name]    MR#:  [id number]  DATE OF BIRTH:  1961/05/13  DATE OF ADMISSION:  08/28/2018  PRIMARY CARE PHYSICIAN: Tower, Wynelle Fanny, MD   REQUESTING/REFERRING PHYSICIAN: Joni Fears, MD  CHIEF COMPLAINT:   Chief Complaint  Patient presents with  . Abdominal Pain    HISTORY OF PRESENT ILLNESS:  Victoria [name]  is a 58 y.o. female who presents with chief complaint as above.  Patient presents the ED with a complaint of epigastric abdominal pain with nausea and vomiting.  Work-up in the ED showed partial small bowel obstruction on imaging.  NG tube was placed and hospitalist were called for admission  PAST MEDICAL HISTORY:   Past Medical History:  Diagnosis Date  . Allergic rhinitis   . Anemia, iron deficiency   . Heavy menses   . Hyperglycemia 02/22/2016  . Lipoma    on L arm  . Uterine fibroid      PAST SURGICAL HISTORY:   Past Surgical History:  Procedure Laterality Date  . CESAREAN SECTION    . CHOLECYSTECTOMY     11/07/11  . ENDOMETRIAL ABLATION     fibroids  . Pelvic U/S and transvaginal  04/27/2009   fibroids x 2  . TONSILLECTOMY    . TUBAL LIGATION    . Uterine ultrasound  06/1998   and endo biopsy- neg     SOCIAL HISTORY:   Social History   Tobacco Use  . Smoking status: Never Smoker  . Smokeless tobacco: Never Used  Substance Use Topics  . Alcohol use: No    Alcohol/week: 0.0 standard drinks     FAMILY HISTORY:   Family History  Problem Relation Age of Onset  . Diabetes Mother   . Hypertension Mother   . Hyperlipidemia Mother   . Cancer Mother        uterine cancer  . Lung cancer Mother        died 29  . Diabetes Father   . Alcohol abuse Father   . Alcohol abuse Sister   . Hypertension Sister   . Diabetes Sister        boarderline  . Alcohol abuse Brother   . Hypertension Brother   . Diabetes Maternal Grandmother   . Alcohol abuse Paternal Grandfather    . Alcohol abuse Brother   . Alcohol abuse Brother   . Alcohol abuse Brother   . Diabetes Sister        severe  . Colon cancer Neg Hx   . Breast cancer Neg Hx      DRUG ALLERGIES:   Allergies  Allergen Reactions  . Codeine     REACTION: nausea and vomiting  . Doxycycline Nausea Only    MEDICATIONS AT HOME:   Prior to Admission medications   Medication Sig Start Date End Date Taking? Authorizing Provider  promethazine (PHENERGAN) 25 MG tablet Take 1 tablet (25 mg total) by mouth every 8 (eight) hours as needed for nausea or vomiting. 08/28/18   Tower, Wynelle Fanny, MD    REVIEW OF SYSTEMS:  Review of Systems  Constitutional: Negative for chills, fever, malaise/fatigue and weight loss.  HENT: Negative for ear pain, hearing loss and tinnitus.   Eyes: Negative for blurred vision, double vision, pain and redness.  Respiratory: Negative for cough, hemoptysis and shortness of breath.   Cardiovascular: Negative for chest pain, palpitations, orthopnea and leg swelling.  Gastrointestinal: Positive for abdominal pain,  nausea and vomiting. Negative for constipation and diarrhea.  Genitourinary: Negative for dysuria, frequency and hematuria.  Musculoskeletal: Negative for back pain, joint pain and neck pain.  Skin:       No acne, rash, or lesions  Neurological: Negative for dizziness, tremors, focal weakness and weakness.  Endo/Heme/Allergies: Negative for polydipsia. Does not bruise/bleed easily.  Psychiatric/Behavioral: Negative for depression. The patient is not nervous/anxious and does not have insomnia.      VITAL SIGNS:   Vitals:   08/28/18 1800 08/28/18 1815 08/28/18 1900 08/28/18 1930  BP: 138/63 (!) 139/55 (!) 117/52 (!) 117/55  Pulse: 89 85 74 78  Resp: 13 19 17 19   Temp:      TempSrc:      SpO2: 96% 96% 97% 97%  Weight:      Height:       Wt Readings from Last 3 Encounters:  08/28/18 90.7 kg  06/11/18 92.1 kg  10/11/17 92.4 kg    PHYSICAL EXAMINATION:  Physical  Exam  Vitals reviewed. Constitutional: She is oriented to person, place, and time. She appears well-developed and well-nourished. No distress.  HENT:  Head: Normocephalic and atraumatic.  Mouth/Throat: Oropharynx is clear and moist.  Eyes: Pupils are equal, round, and reactive to light. Conjunctivae and EOM are normal. No scleral icterus.  Neck: Normal range of motion. Neck supple. No JVD present. No thyromegaly present.  Cardiovascular: Normal rate, regular rhythm and intact distal pulses. Exam reveals no gallop and no friction rub.  No murmur heard. Respiratory: Effort normal and breath sounds normal. No respiratory distress. She has no wheezes. She has no rales.  GI: Soft. Bowel sounds are normal. She exhibits no distension. There is abdominal tenderness.  Musculoskeletal: Normal range of motion.        General: No edema.     Comments: No arthritis, no gout  Lymphadenopathy:    She has no cervical adenopathy.  Neurological: She is alert and oriented to person, place, and time. No cranial nerve deficit.  No dysarthria, no aphasia  Skin: Skin is warm and dry. No rash noted. No erythema.  Psychiatric: She has a normal mood and affect. Her behavior is normal. Judgment and thought content normal.    LABORATORY PANEL:   CBC Recent Labs  Lab 08/28/18 1515  WBC 14.3*  HGB 15.9*  HCT 46.9*  PLT 384   ------------------------------------------------------------------------------------------------------------------  Chemistries  Recent Labs  Lab 08/28/18 1515  NA 138  K 3.7  CL 99  CO2 24  GLUCOSE 164*  BUN 13  CREATININE 0.74  CALCIUM 9.7  AST 20  ALT 23  ALKPHOS 82  BILITOT 0.4   ------------------------------------------------------------------------------------------------------------------  Cardiac Enzymes Recent Labs  Lab 08/28/18 1515  TROPONINI <0.03    ------------------------------------------------------------------------------------------------------------------  RADIOLOGY:  Ct Abdomen Pelvis W Contrast  Result Date: 08/28/2018 CLINICAL DATA:  Two day history of epigastric pain EXAM: CT ABDOMEN AND PELVIS WITH CONTRAST TECHNIQUE: Multidetector CT imaging of the abdomen and pelvis was performed using the standard protocol following bolus administration of intravenous contrast. CONTRAST:  134mL OMNIPAQUE IOHEXOL 300 MG/ML  SOLN COMPARISON:  None. FINDINGS: Lower chest: No acute abnormality. Hepatobiliary: Mild fatty infiltration of the liver is noted. The gallbladder has been surgically removed. No biliary ductal dilatation is noted. Pancreas: Unremarkable. No pancreatic ductal dilatation or surrounding inflammatory changes. Spleen: Normal in size without focal abnormality. Adrenals/Urinary Tract: Adrenal glands are within normal limits. Kidneys are well visualize within normal enhancement pattern. No renal calculi or obstructive changes  are noted. The bladder is partially distended. Stomach/Bowel: Scattered diverticular change of the colon is noted. No evidence of diverticulitis is seen. Dilatation of the jejunum and proximal ileum is noted with fecalization of small bowel contents consistent with delayed transition time and at least partial small bowel obstruction. Transition zone is noted in the left mid abdomen in what appears to be the mid to distal ileum. Some hyperemia is noted in this region although no definitive mass is seen. Vascular/Lymphatic: No significant vascular findings are present. No enlarged abdominal or pelvic lymph nodes. Reproductive: Bulky uterus is noted with scattered calcifications consistent with uterine fibroids. Other: Small amount of free fluid is noted within the pelvis likely reactive in nature. Musculoskeletal: Degenerative changes of lumbar spine are noted. IMPRESSION: Small-bowel dilatation involving primarily the  jejunum and proximal ileum with a transition zone in the left mid abdomen consistent with partial small bowel obstruction. No definitive mass lesion is seen. These changes raise suspicion for inflammatory bowel disease. No surgical changes to suggest adhesions are seen. Fatty liver. Uterine fibroids. Electronically Signed   By: Inez Catalina M.D.   On: 08/28/2018 19:19    EKG:   Orders placed or performed during the hospital encounter of 08/28/18  . ED EKG  . ED EKG    IMPRESSION AND PLAN:  Principal Problem:   SBO (small bowel obstruction) (HCC) -obstruction is only partial, NG tube placed, PRN antiemetics, PRN analgesia, IV fluids Active Problems:   Hyperlipidemia -Home dose antilipid  Chart review performed and case discussed with ED provider. Labs, imaging and/or ECG reviewed by provider and discussed with patient/family. Management plans discussed with the patient and/or family.  DVT PROPHYLAXIS: SubQ lovenox   GI PROPHYLAXIS:  None  ADMISSION STATUS: Inpatient     CODE STATUS: Full  TOTAL TIME TAKING CARE OF THIS PATIENT: 45 minutes.   Ethlyn Daniels 08/28/2018, 10:17 PM  [facility]  Office  [phone]  CC: Primary care physician; Tower, Wynelle Fanny, MD  Note:  This document was prepared using Systems analyst and may include unintentional dictation errors.

## 2018-08-28 NOTE — ED Notes (Signed)
ED TO INPATIENT HANDOFF REPORT  ED Nurse Name and Phone #: Lorriane Shire 58  S Name/Age/Gender Victoria [name] 58 y.o. female Room/Bed: ED09A/ED09A  Code Status   Code Status: Not on file  Home/SNF/Other Home Patient oriented to: self, place, time and situation Is this baseline? Yes   Triage Complete: Triage complete  Chief Complaint Abd pain  Triage Note Here for epigastric/lower chest pain.  Pt reports constant and started yesterday. Describes as cramping. Multiple episodes of vomiting.  +chills. No diarrhea. Gallbladder has been removed.  No known fevers.    Allergies Allergies  Allergen Reactions  . Codeine     REACTION: nausea and vomiting  . Doxycycline Nausea Only    Level of Care/Admitting Diagnosis ED Disposition    ED Disposition Condition [facility]: [place] [[id number]]  Level of Care: Med-Surg [16]  Diagnosis: SBO (small bowel obstruction) [facility]) [[id number]]  Admitting Physician: Lance Coon [[id number]]  Attending Physician: Jannifer Franklin, DAVID [phone]  Estimated length of stay: past midnight tomorrow  Certification:: I certify this patient will need inpatient services for at least 2 midnights  Possible Covid Disease Patient Isolation: N/A  PT Class (Do Not Modify): Inpatient [101]  PT Acc Code (Do Not Modify): Private [1]       B Medical/Surgery History Past Medical History:  Diagnosis Date  . Allergic rhinitis   . Anemia, iron deficiency   . Heavy menses   . Hyperglycemia 02/22/2016  . Lipoma    on L arm  . Uterine fibroid    Past Surgical History:  Procedure Laterality Date  . CESAREAN SECTION    . CHOLECYSTECTOMY     11/07/11  . ENDOMETRIAL ABLATION     fibroids  . Pelvic U/S and transvaginal  04/27/2009   fibroids x 2  . TONSILLECTOMY    . TUBAL LIGATION    . Uterine ultrasound  06/1998   and endo biopsy- neg     A IV Location/Drains/Wounds Patient Lines/Drains/Airways Status   Active  Line/Drains/Airways    Name:   Placement date:   Placement time:   Site:   Days:   Peripheral IV 08/28/18 Left Antecubital   08/28/18    1734    Antecubital   less than 1   NG/OG Tube Nasogastric 18 Fr. Right nare Xray;Aucultation   08/28/18    2252    Right nare   less than 1          Intake/Output Last 24 hours No intake or output data in the 24 hours ending 08/28/18 2255  Labs/Imaging Results for orders placed or performed during the hospital encounter of 08/28/18 (from the past 48 hour(s))  Lipase, blood     Status: None   Collection Time: 08/28/18  3:15 PM  Result Value Ref Range   Lipase 34 11 - 51 U/L    Comment: Performed at [facility], [address] [place], [place] [address]  Comprehensive metabolic panel     Status: Abnormal   Collection Time: 08/28/18  3:15 PM  Result Value Ref Range   Sodium 138 135 - 145 mmol/L   Potassium 3.7 3.5 - 5.1 mmol/L   Chloride 99 98 - 111 mmol/L   CO2 24 22 - 32 mmol/L   Glucose, Bld 164 (H) 70 - 99 mg/dL   BUN 13 6 - 20 mg/dL   Creatinine, Ser 0.74 0.44 - 1.00 mg/dL   Calcium 9.7 8.9 - 10.3 mg/dL  Total Protein 8.6 (H) 6.5 - 8.1 g/dL   Albumin 4.9 3.5 - 5.0 g/dL   AST 20 15 - 41 U/L   ALT 23 0 - 44 U/L   Alkaline Phosphatase 82 38 - 126 U/L   Total Bilirubin 0.4 0.3 - 1.2 mg/dL   GFR calc non Af Amer >60 >60 mL/min   GFR calc Af Amer >60 >60 mL/min   Anion gap 15 5 - 15    Comment: Performed at [facility], [address] [place], [place] [address]  CBC     Status: Abnormal   Collection Time: 08/28/18  3:15 PM  Result Value Ref Range   WBC 14.3 (H) 4.0 - 10.5 K/uL   RBC 5.77 (H) 3.87 - 5.11 MIL/uL   Hemoglobin 15.9 (H) 12.0 - 15.0 g/dL   HCT 46.9 (H) 36.0 - 46.0 %   MCV 81.3 80.0 - 100.0 fL   MCH 27.6 26.0 - 34.0 pg   MCHC 33.9 30.0 - 36.0 g/dL   RDW 12.6 11.5 - 15.5 %   Platelets 384 150 - 400 K/uL   nRBC 0.0 0.0 - 0.2 %    Comment: Performed at [facility], [address]  [place], [place] [address]  Troponin I - ONCE - STAT     Status: None   Collection Time: 08/28/18  3:15 PM  Result Value Ref Range   Troponin I <0.03 <0.03 ng/mL    Comment: Performed at [facility], [address] [place],  [address]   Ct Abdomen Pelvis W Contrast  Result Date: 08/28/2018 CLINICAL DATA:  Two day history of epigastric pain EXAM: CT ABDOMEN AND PELVIS WITH CONTRAST TECHNIQUE: Multidetector CT imaging of the abdomen and pelvis was performed using the standard protocol following bolus administration of intravenous contrast. CONTRAST:  155mL OMNIPAQUE IOHEXOL 300 MG/ML  SOLN COMPARISON:  None. FINDINGS: Lower chest: No acute abnormality. Hepatobiliary: Mild fatty infiltration of the liver is noted. The gallbladder has been surgically removed. No biliary ductal dilatation is noted. Pancreas: Unremarkable. No pancreatic ductal dilatation or surrounding inflammatory changes. Spleen: Normal in size without focal abnormality. Adrenals/Urinary Tract: Adrenal glands are within normal limits. Kidneys are well visualize within normal enhancement pattern. No renal calculi or obstructive changes are noted. The bladder is partially distended. Stomach/Bowel: Scattered diverticular change of the colon is noted. No evidence of diverticulitis is seen. Dilatation of the jejunum and proximal ileum is noted with fecalization of small bowel contents consistent with delayed transition time and at least partial small bowel obstruction. Transition zone is noted in the left mid abdomen in what appears to be the mid to distal ileum. Some hyperemia is noted in this region although no definitive mass is seen. Vascular/Lymphatic: No significant vascular findings are present. No enlarged abdominal or pelvic lymph nodes. Reproductive: Bulky uterus is noted with scattered calcifications consistent with uterine fibroids. Other: Small amount of free fluid is noted within the pelvis likely reactive in nature.  Musculoskeletal: Degenerative changes of lumbar spine are noted. IMPRESSION: Small-bowel dilatation involving primarily the jejunum and proximal ileum with a transition zone in the left mid abdomen consistent with partial small bowel obstruction. No definitive mass lesion is seen. These changes raise suspicion for inflammatory bowel disease. No surgical changes to suggest adhesions are seen. Fatty liver. Uterine fibroids. Electronically Signed   By: Inez Catalina M.D.   On: 08/28/2018 19:19    Pending Labs [employer] (From admission, onward)    Start  Ordered   08/28/18 1506  Urinalysis, Complete w Microscopic  ONCE - STAT,   STAT     08/28/18 1506   Signed and Held  HIV antibody (Routine Testing)  Once,   R     Signed and Held   Signed and Held  CBC  (enoxaparin (LOVENOX)    CrCl >/= 30 ml/min)  Once,   R    Comments:  Baseline for enoxaparin therapy IF NOT ALREADY DRAWN.  Notify MD if PLT < 100 K.    Signed and Held   Signed and Held  Creatinine, serum  (enoxaparin (LOVENOX)    CrCl >/= 30 ml/min)  Once,   R    Comments:  Baseline for enoxaparin therapy IF NOT ALREADY DRAWN.    Signed and Held   Signed and Held  Creatinine, serum  (enoxaparin (LOVENOX)    CrCl >/= 30 ml/min)  Weekly,   R    Comments:  while on enoxaparin therapy    Signed and Held   Signed and Held  Basic metabolic panel  Tomorrow morning,   R     Signed and Held   Signed and Held  CBC  Tomorrow morning,   R     Signed and Held          Vitals/Pain Today's Vitals   08/28/18 1800 08/28/18 1815 08/28/18 1900 08/28/18 1930  BP: 138/63 (!) 139/55 (!) 117/52 (!) 117/55  Pulse: 89 85 74 78  Resp: 13 19 17 19   Temp:      TempSrc:      SpO2: 96% 96% 97% 97%  Weight:      Height:      PainSc:        Isolation Precautions No active isolations  Medications Medications  morphine 2 MG/ML injection 1-2 mg (2 mg Intravenous Given 08/28/18 2220)  lidocaine (XYLOCAINE) 2 % jelly 1 application (has no  administration in time range)  ondansetron (ZOFRAN-ODT) disintegrating tablet 4 mg (4 mg Oral Given 08/28/18 1512)  sodium chloride 0.9 % bolus 1,000 mL (0 mLs Intravenous Stopped 08/28/18 1933)  ketorolac (TORADOL) 30 MG/ML injection 15 mg (15 mg Intravenous Given 08/28/18 1838)  pantoprazole (PROTONIX) injection 40 mg (40 mg Intravenous Given 08/28/18 1837)  iohexol (OMNIPAQUE) 300 MG/ML solution 100 mL (100 mLs Intravenous Contrast Given 08/28/18 1824)    Mobility walks Low fall risk   Focused Assessments ab   R Recommendations: See Admitting Provider Note  Report given to:   Additional Notes:

## 2018-08-28 NOTE — Assessment & Plan Note (Signed)
Since yesterday with epigastric pain and suspected fever (no diarrhea) Unable to keep fluids down since last night  Did see scant brb once (suspect from straining) but not coffee ground type material  Does not take nsaids often  Suspect this is most likely viral gastroenteritis  inst to start checking her temp  Sent in phenergan for nausea with caution of sedation  Goal to control nausea so she can get in sips of fluids Disc s/s of dehydration and inst to go to ED if these develop  Sips of clear fluids No food until improved -then bland /BRAT diet  Watch for diarrhea If more blood or if dark matter in vomitus -go to ED If worse pain -go to ED as well  Update if not starting to improve in 1-2 days or if worsening

## 2018-08-28 NOTE — Progress Notes (Signed)
Virtual Visit via Video Note  I connected with Victoria [name] on 08/28/18 at 10:45 AM EDT by a video enabled telemedicine application and verified that I am speaking with the correct person using two identifiers. She is in her home today I am at my office/ [place] Pt was triaged earlier to go to ER but she refused  I discussed the limitations of evaluation and management by telemedicine and the availability of in person appointments. The patient expressed understanding and agreed to proceed.  History of Present Illness: Nausea/vomiting   Headache   Vomiting so much she saw blood   Started as a knot at top of stomach It cramps and is burning (like spasms)  It started yesterday am and got worse as the day went on  Vomited 3 times since last night   She did see a little bit -bright red  No black material   No diarrhea  Had a headache last night- is better now   Cannot keep down fluids  Last drink was yesterday  No other abdominal pain -just at the top   Getting hot and cold chills  No body aches  Has not checked temp (looking for thermometer)   Yesterday took a tylenol- it helped a bit   No sick contacts known   Last had banana and protein drink - does not think that she has food poisoning  Takes ibuprofen now and then- had a dose on Saturday  occ indigestion- no heartburn lately however No hx of an ulcer that she knows of   Review of Systems  Constitutional: Positive for chills, diaphoresis, fever and malaise/fatigue.  HENT: Negative for sore throat.   Respiratory: Negative for cough, shortness of breath and wheezing.   Cardiovascular: Negative for chest pain and palpitations.  Gastrointestinal: Positive for abdominal pain, nausea and vomiting. Negative for blood in stool, constipation and diarrhea.  Genitourinary: Negative for dysuria.  Musculoskeletal: Negative for myalgias.  Skin: Negative for itching and rash.  Neurological: Positive for headaches. Negative for  dizziness.      Observations/Objective: Pt appears ill  Sitting at edge of bed (supportive family present)  Seems very uncomfortable with nausea (does not vomit during interview)  Not tearful  Not confused, answers questions appropriately Denies dizziness or rapid heart beat (family member said her mouth was not dry on inspection)  Not coughing and voice is not hoarse    Assessment and Plan: Problem List Items Addressed This Visit      Digestive   Nausea & vomiting    Since yesterday with epigastric pain and suspected fever (no diarrhea) Unable to keep fluids down since last night  Did see scant brb once (suspect from straining) but not coffee ground type material  Does not take nsaids often  Suspect this is most likely viral gastroenteritis  inst to start checking her temp  Sent in phenergan for nausea with caution of sedation  Goal to control nausea so she can get in sips of fluids Disc s/s of dehydration and inst to go to ED if these develop  Sips of clear fluids No food until improved -then bland /BRAT diet  Watch for diarrhea If more blood or if dark matter in vomitus -go to ED If worse pain -go to ED as well  Update if not starting to improve in 1-2 days or if worsening            Follow Up Instructions: Try the phenergan for nausea (if you want  the rectal supp in stead let us know) Then start getting sips of fluids Watch for dizziness/rapid heartbeat/dry mouth-those are signs of dehydration and would require ER visit for fluids  Watch for fever (start checking temp)/ worse abd pain/ continued vomiting/diarrhea or more blood with vomiting Watch for dark matter when vomiting (a sign of bleeding)  Call or go to ER if above or if not improving in several days      I discussed the assessment and treatment plan with the patient. The patient was provided an opportunity to ask questions and all were answered. The patient agreed with the plan and demonstrated an  understanding of the instructions.   The patient was advised to call back or seek an in-person evaluation if the symptoms worsen or if the condition fails to improve as anticipated.   Loura Pardon, MD

## 2018-08-29 DIAGNOSIS — K5651 Intestinal adhesions [bands], with partial obstruction: Principal | ICD-10-CM

## 2018-08-29 LAB — BASIC METABOLIC PANEL
Anion gap: 8 (ref 5–15)
BUN: 17 mg/dL (ref 6–20)
CO2: 27 mmol/L (ref 22–32)
Calcium: 9.1 mg/dL (ref 8.9–10.3)
Chloride: 106 mmol/L (ref 98–111)
Creatinine, Ser: 0.79 mg/dL (ref 0.44–1.00)
GFR calc Af Amer: >60 mL/min (ref >60)
GFR calc non Af Amer: >60 mL/min (ref >60)
Glucose, Bld: 128 mg/dL — ABNORMAL HIGH (ref 70–99)
Potassium: 3.9 mmol/L (ref 3.5–5.1)
Sodium: 141 mmol/L (ref 135–145)

## 2018-08-29 LAB — CBC
HCT: 43.3 % (ref 36.0–46.0)
Hemoglobin: 14.3 g/dL (ref 12.0–15.0)
MCH: 27.5 pg (ref 26.0–34.0)
MCHC: 33 g/dL (ref 30.0–36.0)
MCV: 83.3 fL (ref 80.0–100.0)
Platelets: 340 10*3/uL (ref 150–400)
RBC: 5.2 MIL/uL — ABNORMAL HIGH (ref 3.87–5.11)
RDW: 12.8 % (ref 11.5–15.5)
WBC: 13.3 10*3/uL — ABNORMAL HIGH (ref 4.0–10.5)
nRBC: 0 % (ref 0.0–0.2)

## 2018-08-29 MED ORDER — MORPHINE SULFATE (PF) 2 MG/ML IV SOLN
2.0000 mg | INTRAVENOUS | Status: DC | PRN
  Administered 2018-08-29 (×2): 2 mg via INTRAVENOUS
  Filled 2018-08-29 (×2): qty 1

## 2018-08-29 MED ORDER — KCL IN DEXTROSE-NACL 20-5-0.45 MEQ/L-%-% IV SOLN
INTRAVENOUS | Status: DC
  Administered 2018-08-29 – 2018-08-31 (×5): via INTRAVENOUS
  Filled 2018-08-29 (×8): qty 1000

## 2018-08-29 NOTE — Progress Notes (Signed)
   08/29/18 1500  Clinical Encounter Type  Visited With Patient not available  Visit Type Initial  Referral From Chaplain  Consult/Referral To Chaplain  Chaplain was rounding by phone calls and patient was not available.  

## 2018-08-29 NOTE — Consult Note (Addendum)
[facility] SURGICAL CONSULTATION NOTE (initial) - cpt: [id number]   HISTORY OF PRESENT ILLNESS (HPI):  58 y.o. female presented to [facility] ED on 4/14 for evaluation of abdominal pain. Patient reports the acute onset of upper abdominal pain 1 day prior to admission which she described as sharp. This pain has been constant and was gradually worsening since the onset. She endorses associated nausea, emesis, and decreased PO intake. Denied any associated fever, chills, cough, congestion, CP, SOB, or urinary changes. She does report having a BM yesterday morning but that was followed by 5 episodes of emesis. No history of similar pain in the past. Previous abdominal surgeries are positive for cholecystectomy, c-section x3, and an "intestinal surgery as a baby". Work up in the ED was concerning for SBO with transition point in the left mid-abdomen. She was admitted to medicine and NGT was placed.   This morning, she feels that her abdominal distension has improved some but she still has intermittent cramping pains. Emesis has resolved but she is still complaining of nausea. No reports of flatus. NGT with 170 ccs out since placement  Surgery is consulted by hospitalist physician Dr. Darvin Neighbours, MD in this context for evaluation and management of partial SBO.   PAST MEDICAL HISTORY (PMH):  Past Medical History:  Diagnosis Date  . Allergic rhinitis   . Anemia, iron deficiency   . Heavy menses   . Hyperglycemia 02/22/2016  . Lipoma    on L arm  . Uterine fibroid      PAST SURGICAL HISTORY [facility]):  Past Surgical History:  Procedure Laterality Date  . CESAREAN SECTION    . CHOLECYSTECTOMY     11/07/11  . ENDOMETRIAL ABLATION     fibroids  . Pelvic U/S and transvaginal  04/27/2009   fibroids x 2  . TONSILLECTOMY    . TUBAL LIGATION    . Uterine ultrasound  06/1998   and endo biopsy- neg     MEDICATIONS:  Prior to Admission medications   Medication Sig Start Date End Date Taking?  Authorizing Provider  promethazine (PHENERGAN) 25 MG tablet Take 1 tablet (25 mg total) by mouth every 8 (eight) hours as needed for nausea or vomiting. 08/28/18   Tower, Wynelle Fanny, MD     ALLERGIES:  Allergies  Allergen Reactions  . Codeine     REACTION: nausea and vomiting  . Doxycycline Nausea Only     SOCIAL HISTORY:  Social History   Socioeconomic History  . Marital status: Married    Spouse name: Not on file  . Number of children: 3  . Years of education: Not on file  . Highest education level: Not on file  Occupational History  . Occupation: Medical laboratory scientific officer: SELF  Social Needs  . Financial resource strain: Not on file  . Food insecurity:    Worry: Not on file    Inability: Not on file  . Transportation needs:    Medical: Not on file    Non-medical: Not on file  Tobacco Use  . Smoking status: Never Smoker  . Smokeless tobacco: Never Used  Substance and Sexual Activity  . Alcohol use: No    Alcohol/week: 0.0 standard drinks  . Drug use: No  . Sexual activity: Yes    Partners: Male    Birth control/protection: None  Lifestyle  . Physical activity:    Days per week: Not on file    Minutes per session: Not on file  . Stress: Not  on file  Relationships  . Social connections:    Talks on phone: Not on file    Gets together: Not on file    Attends religious service: Not on file    Active member of club or organization: Not on file    Attends meetings of clubs or organizations: Not on file    Relationship status: Not on file  . Intimate partner violence:    Fear of current or ex partner: Not on file    Emotionally abused: Not on file    Physically abused: Not on file    Forced sexual activity: Not on file  Other Topics Concern  . Not on file  Social History Narrative  . Not on file     FAMILY HISTORY:  Family History  Problem Relation Age of Onset  . Diabetes Mother   . Hypertension Mother   . Hyperlipidemia Mother   . Cancer Mother         uterine cancer  . Lung cancer Mother        died 24  . Diabetes Father   . Alcohol abuse Father   . Alcohol abuse Sister   . Hypertension Sister   . Diabetes Sister        boarderline  . Alcohol abuse Brother   . Hypertension Brother   . Diabetes Maternal Grandmother   . Alcohol abuse Paternal Grandfather   . Alcohol abuse Brother   . Alcohol abuse Brother   . Alcohol abuse Brother   . Diabetes Sister        severe  . Colon cancer Neg Hx   . Breast cancer Neg Hx       REVIEW OF SYSTEMS:  Review of Systems  Constitutional: Negative for chills and fever.  Respiratory: Negative for cough and shortness of breath.   Cardiovascular: Negative for chest pain and palpitations.  Gastrointestinal: Positive for abdominal pain, nausea and vomiting. Negative for blood in stool, constipation and diarrhea.  Genitourinary: Negative for dysuria and hematuria.  Musculoskeletal: Negative for myalgias.  Neurological: Negative for dizziness and headaches.  All other systems reviewed and are negative.   VITAL SIGNS:  Temp:  [97.9 F (36.6 C)-98.5 F (36.9 C)] 98.3 F (36.8 C) (04/15 0457) Pulse Rate:  [74-99] 87 (04/15 0457) Resp:  [13-20] 18 (04/15 0457) BP: (117-171)/(52-83) 142/83 (04/15 0457) SpO2:  [93 %-97 %] 95 % (04/15 0457) Weight:  [90.7 kg] 90.7 kg (04/14 1505)     Height: 5\' 3"  (160 cm) Weight: 90.7 kg BMI (Calculated): 35.44   INTAKE/OUTPUT:  This shift: Total I/O In: -  Out: 250 [Urine:250]  Last 2 shifts: @IOLAST2SHIFTS @   PHYSICAL EXAM:  Physical Exam Vitals signs and nursing note reviewed.  Constitutional:      General: She is not in acute distress.    Appearance: She is well-developed. She is obese. She is not ill-appearing.  HENT:     Head: Normocephalic and atraumatic.     Nose:     Comments: NGT in place Eyes:     General: No scleral icterus.    Extraocular Movements: Extraocular movements intact.  Cardiovascular:     Rate and Rhythm: Normal rate and  regular rhythm.     Heart sounds: Normal heart sounds. No murmur. No friction rub. No gallop.   Pulmonary:     Effort: Pulmonary effort is normal. No respiratory distress.     Breath sounds: Normal breath sounds. No wheezing or rhonchi.  Abdominal:  General: Abdomen is protuberant. A surgical scar is present. There is no distension.     Palpations: Abdomen is soft.     Tenderness: There is no abdominal tenderness. There is no guarding or rebound.     Comments: Multiple healed previous surgical incisions Tympanic to percussion upper abdomen   Genitourinary:    Comments: Deferred Skin:    General: Skin is warm and dry.     Coloration: Skin is not jaundiced or pale.  Neurological:     General: No focal deficit present.     Mental Status: She is alert and oriented to person, place, and time.  Psychiatric:        Mood and Affect: Mood normal.        Behavior: Behavior normal.       Labs:  CBC Latest Ref Rng & Units 08/29/2018 08/28/2018 06/05/2018  WBC 4.0 - 10.5 K/uL 13.3(H) 14.3(H) 6.3  Hemoglobin 12.0 - 15.0 g/dL 14.3 15.9(H) 14.1  Hematocrit 36.0 - 46.0 % 43.3 46.9(H) 41.4  Platelets 150 - 400 K/uL 340 384 264.0   CMP Latest Ref Rng & Units 08/29/2018 08/28/2018 06/05/2018  Glucose 70 - 99 mg/dL 128(H) 164(H) 100(H)  BUN 6 - 20 mg/dL 17 13 12   Creatinine 0.44 - 1.00 mg/dL 0.79 0.74 0.72  Sodium 135 - 145 mmol/L 141 138 139  Potassium 3.5 - 5.1 mmol/L 3.9 3.7 3.8  Chloride 98 - 111 mmol/L 106 99 105  CO2 22 - 32 mmol/L 27 24 28   Calcium 8.9 - 10.3 mg/dL 9.1 9.7 9.2  Total Protein 6.5 - 8.1 g/dL - 8.6(H) 7.2  Total Bilirubin 0.3 - 1.2 mg/dL - 0.4 0.4  Alkaline Phos 38 - 126 U/L - 82 67  AST 15 - 41 U/L - 20 19  ALT 0 - 44 U/L - 23 20     Imaging studies:   CT Abdomen/Pelvis (08/28/2018) personally reviewed and radiologist report reviewed:  IMPRESSION: 1) Small-bowel dilatation involving primarily the jejunum and proximal ileum with a transition zone in the left mid  abdomen consistent with partial small bowel obstruction. No definitive mass lesion is seen. These changes raise suspicion for inflammatory bowel disease. No surgical changes to suggest adhesions are seen. 2) Fatty liver. 3) Uterine fibroids.  KUB (08/29/2018) personally reviewed and radiologist report reviewed:  IMPRESSION: Gastric catheter within the stomach.   Assessment/Plan: (ICD-10's: K69.51) 58 y.o. female with partial small bowel obstruction, likely attributable to post-surgical adhesions following multiple abdominal surgeries (cholecystectomy, c-section x3), complicated by pertinent comorbidities including anemia, hyperglycemia, and obesity.  - NPO for now, IV fluids             - continue NG tube for nasogastric decompression             - monitor ongoing bowel function and abdominal exam              - anticipate symptomatic relief within 24 - 48 hours following NGT insertion, followed by "rumbling" the following day and flatus either the same day or the day following the "rumbling" with anticipated length of stay ~3 - 5 days with successful non-operative management for 8 of 10 patients with small bowel obstruction attributed to post-surgical adhesions  - surgical intervention if doesn't improve was also discussed             - ambulation encouraged              - DVT prophylaxis   - Will assume  care from medicine team  All of the above findings and recommendations were discussed with the patient, and all of patient's questions were answered to her expressed satisfaction.  Thank you for the opportunity to participate in this patient's care.   -- Edison Simon, PA-C [facility] 08/29/2018, 9:11 AM [phone] M-F: 7am - 4pm

## 2018-08-29 NOTE — Plan of Care (Signed)
  Problem: Pain Managment: Goal: General experience of comfort will improve Outcome: Progressing  Pain controlled with PRN pain medications.    Problem: Activity: Goal: Risk for activity intolerance will decrease Outcome: Progressing  Patient ambulated in hallways

## 2018-08-30 ENCOUNTER — Inpatient Hospital Stay: Payer: No Typology Code available for payment source

## 2018-08-30 LAB — CBC
HCT: 41.6 % (ref 36.0–46.0)
Hemoglobin: 13.5 g/dL (ref 12.0–15.0)
MCH: 27.2 pg (ref 26.0–34.0)
MCHC: 32.5 g/dL (ref 30.0–36.0)
MCV: 83.9 fL (ref 80.0–100.0)
Platelets: 304 10*3/uL (ref 150–400)
RBC: 4.96 MIL/uL (ref 3.87–5.11)
RDW: 12.9 % (ref 11.5–15.5)
WBC: 11 10*3/uL — ABNORMAL HIGH (ref 4.0–10.5)
nRBC: 0 % (ref 0.0–0.2)

## 2018-08-30 LAB — BASIC METABOLIC PANEL
Anion gap: 6 (ref 5–15)
BUN: 13 mg/dL (ref 6–20)
CO2: 27 mmol/L (ref 22–32)
Calcium: 8.6 mg/dL — ABNORMAL LOW (ref 8.9–10.3)
Chloride: 106 mmol/L (ref 98–111)
Creatinine, Ser: 0.76 mg/dL (ref 0.44–1.00)
GFR calc Af Amer: >60 mL/min (ref >60)
GFR calc non Af Amer: >60 mL/min (ref >60)
Glucose, Bld: 133 mg/dL — ABNORMAL HIGH (ref 70–99)
Potassium: 3.9 mmol/L (ref 3.5–5.1)
Sodium: 139 mmol/L (ref 135–145)

## 2018-08-30 LAB — HIV ANTIBODY (ROUTINE TESTING W REFLEX): HIV Screen 4th Generation wRfx: NONREACTIVE

## 2018-08-30 MED ORDER — ONDANSETRON HCL 4 MG/2ML IJ SOLN
4.0000 mg | Freq: Four times a day (QID) | INTRAMUSCULAR | Status: DC | PRN
  Administered 2018-08-30: 4 mg via INTRAVENOUS
  Filled 2018-08-30: qty 2

## 2018-08-30 MED ORDER — HYOSCYAMINE SULFATE ER 0.375 MG PO TB12: 0.3750 mg | ORAL_TABLET | Freq: Two times a day (BID) | ORAL | Status: DC

## 2018-08-30 MED ORDER — ACETAMINOPHEN 500 MG PO TABS: 1000.0000 mg | ORAL_TABLET | Freq: Four times a day (QID) | ORAL | Status: DC | PRN

## 2018-08-30 MED ORDER — HYOSCYAMINE SULFATE 0.5 MG/ML IJ SOLN: 0.2500 mg | Freq: Four times a day (QID) | INTRAMUSCULAR | Status: DC | PRN

## 2018-08-30 NOTE — Progress Notes (Addendum)
[facility] SURGICAL PROGRESS NOTE (cpt [phone])  Hospital Day(s): 2.   Post op day(s):  Marland Kitchen   Interval History: Patient seen and examined, no acute events or new complaints overnight. Patient reports that her abdominal pain and distension have improved this morning. Denied any fever, chills, nausea, or emesis. NGT has put out about 800 ccs in last 24 hours. She does endorse that she has passed flatus multiple times this morning and feels like she needs to have a BM. Mobilizing well. No other additional complaints this morning.   Review of Systems:  Constitutional: denies fever, chills  HEENT: denies cough or congestion  Respiratory: denies any shortness of breath  Cardiovascular: denies chest pain or palpitations  Gastrointestinal: denies abdominal pain, N/V, or diarrhea/and bowel function as per interval history Genitourinary: denies burning with urination or urinary frequency   Vital signs in last 24 hours: [min-max] current  Temp:  [97.7 F (36.5 C)-98.2 F (36.8 C)] 98.2 F (36.8 C) (04/16 0505) Pulse Rate:  [74-84] 78 (04/16 0505) Resp:  [16-18] 18 (04/16 0505) BP: (150-154)/(73-90) 150/75 (04/16 0505) SpO2:  [93 %-95 %] 94 % (04/16 0505)     Height: 5\' 3"  (160 cm) Weight: 90.7 kg BMI (Calculated): 35.44   Intake/Output this shift:  Total I/O In: -  Out: 100 [Emesis/NG output:100]    Physical Exam:  Constitutional: alert, cooperative and no distress  HENT: normocephalic without obvious abnormality, NGT in place (flushes well)  Eyes: PERRL, EOM's grossly intact and symmetric  Respiratory: breathing non-labored at rest  Cardiovascular: regular rate and sinus rhythm  Gastrointestinal: soft, non-tender, and non-distended, no rebound/guarding.  Musculoskeletal: no edema or wounds, motor and sensation grossly intact, NT  Integumentary: Warm, dry. Previous abdomen surgical scars appreciated, well healed.    Labs:  CBC Latest Ref Rng & Units 08/30/2018  08/29/2018 08/28/2018  WBC 4.0 - 10.5 K/uL 11.0(H) 13.3(H) 14.3(H)  Hemoglobin 12.0 - 15.0 g/dL 13.5 14.3 15.9(H)  Hematocrit 36.0 - 46.0 % 41.6 43.3 46.9(H)  Platelets 150 - 400 K/uL 304 340 384   CMP Latest Ref Rng & Units 08/30/2018 08/29/2018 08/28/2018  Glucose 70 - 99 mg/dL 133(H) 128(H) 164(H)  BUN 6 - 20 mg/dL 13 17 13   Creatinine 0.44 - 1.00 mg/dL 0.76 0.79 0.74  Sodium 135 - 145 mmol/L 139 141 138  Potassium 3.5 - 5.1 mmol/L 3.9 3.9 3.7  Chloride 98 - 111 mmol/L 106 106 99  CO2 22 - 32 mmol/L 27 27 24   Calcium 8.9 - 10.3 mg/dL 8.6(L) 9.1 9.7  Total Protein 6.5 - 8.1 g/dL - - 8.6(H)  Total Bilirubin 0.3 - 1.2 mg/dL - - 0.4  Alkaline Phos 38 - 126 U/L - - 82  AST 15 - 41 U/L - - 20  ALT 0 - 44 U/L - - 23     Imaging studies:   KUB (08/30/2018) personally reviewed and obstruction pattern appears improved on my interpretation, and radiologist report reviewed:  IMPRESSION: 1) Multiple air-filled mildly dilated small bowel loops with air-fluid levels. Findings are slightly worse and likely due to ileus versus early/partial small bowel obstruction. 2) NG tube with tip over the stomach in the left upper quadrant. 3) Mild bibasilar opacification which may be due to atelectasis or infection.   Assessment/Plan: (ICD-10's: K56.51) 58 y.o.femalewith what appears to be clinically improving partial small bowel obstruction given her improved distension/pain and no evidence of bowel function return, likely attributable to post-surgical adhesions followinginfant open abdominal surgery for what  sounds like omphalocele or similar, as well as c-section x3 and laparoscopic cholecystectomy, complicated by comorbidities includingobesity (BMI >35), hyperglycemia, and chronic anemia.   - NPO + IVF   - Continue NGT decompression today  - Will get small bowel follow through tomorrow morning to reassess SBO.   - Monitor on-going bowel function; abdominal examination   - anticipate symptomatic  relief within 24 - 48 hours following NGT insertion, followed by "rumbling" the following day and flatus either the same day or the day following the "rumbling" with anticipated length of stay ~3 - 5 days with successful non-operative management for 8 of 10 patients with small bowel obstruction attributed to post-surgical adhesions - surgical intervention if doesn't improve was also discussed - ambulation encouraged - DVT prophylaxis   All of the above findings and recommendations were discussed with the patient, and the medical team, and all of patient's questions were answered to her expressed satisfaction.  -- Edison Simon, PA-C  [facility] 08/30/2018, 11:21 AM [phone] M-F: 7am - 4pm

## 2018-08-31 DIAGNOSIS — K56609 Unspecified intestinal obstruction, unspecified as to partial versus complete obstruction: Secondary | ICD-10-CM

## 2018-08-31 LAB — BASIC METABOLIC PANEL
Anion gap: 6 (ref 5–15)
BUN: 10 mg/dL (ref 6–20)
CO2: 24 mmol/L (ref 22–32)
Calcium: 8.3 mg/dL — ABNORMAL LOW (ref 8.9–10.3)
Chloride: 107 mmol/L (ref 98–111)
Creatinine, Ser: 0.72 mg/dL (ref 0.44–1.00)
GFR calc Af Amer: >60 mL/min (ref >60)
GFR calc non Af Amer: >60 mL/min (ref >60)
Glucose, Bld: 125 mg/dL — ABNORMAL HIGH (ref 70–99)
Potassium: 3.5 mmol/L (ref 3.5–5.1)
Sodium: 137 mmol/L (ref 135–145)

## 2018-08-31 NOTE — Progress Notes (Addendum)
[facility] SURGICAL PROGRESS NOTE (cpt [phone])  Hospital Day(s): 3.   Post op day(s):  Marland Kitchen   Interval History: Patient seen and examined, no acute events or new complaints overnight. Patient had return of bowel function yesterday and NGT was removed and she was started on clear liquids. Patient reports that she is feeling better this morning still with some "gas pains" in her upper abdomen. No reports of distension, nausea, or emesis. She continues to have looser stools 1-2 times a day and endorses flatus. Mobilizing well. No other complaints.  Review of Systems:  Constitutional: denies fever, chills  HEENT: denies cough Respiratory: denies any shortness of breath  Cardiovascular: denies chest pain or palpitations  Gastrointestinal: denies abdominal pain, N/V, or diarrhea/and bowel function as per interval history Genitourinary: denies burning with urination or urinary frequency   Vital signs in last 24 hours: [min-max] current  Temp:  [98.2 F (36.8 C)-98.7 F (37.1 C)] 98.2 F (36.8 C) (04/17 0517) Pulse Rate:  [68-70] 70 (04/17 0517) Resp:  [16-18] 18 (04/17 0517) BP: (133-139)/(56-72) 133/64 (04/17 0517) SpO2:  [93 %-96 %] 94 % (04/17 0517)     Height: 5\' 3"  (160 cm) Weight: 90.7 kg BMI (Calculated): 35.44   Intake/Output this shift:  No intake/output data recorded.    Physical Exam:  Constitutional: alert, cooperative and no distress  HENT: normocephalic without obvious abnormality Eyes: PERRL, EOM's grossly intact and symmetric  Respiratory: breathing non-labored at rest  Cardiovascular: regular rate and sinus rhythm  Gastrointestinal: soft, non-tender, and non-distended, no rebound/guarding.  Musculoskeletal: no edema or wounds, motor and sensation grossly intact, NT  Integumentary: Warm, dry. Previous abdomen surgical scars appreciated, well healed.    Labs:  CBC Latest Ref Rng & Units 08/30/2018 08/29/2018 08/28/2018  WBC 4.0 - 10.5 K/uL 11.0(H)  13.3(H) 14.3(H)  Hemoglobin 12.0 - 15.0 g/dL 13.5 14.3 15.9(H)  Hematocrit 36.0 - 46.0 % 41.6 43.3 46.9(H)  Platelets 150 - 400 K/uL 304 340 384   CMP Latest Ref Rng & Units 08/31/2018 08/30/2018 08/29/2018  Glucose 70 - 99 mg/dL 125(H) 133(H) 128(H)  BUN 6 - 20 mg/dL 10 13 17   Creatinine 0.44 - 1.00 mg/dL 0.72 0.76 0.79  Sodium 135 - 145 mmol/L 137 139 141  Potassium 3.5 - 5.1 mmol/L 3.5 3.9 3.9  Chloride 98 - 111 mmol/L 107 106 106  CO2 22 - 32 mmol/L 24 27 27   Calcium 8.9 - 10.3 mg/dL 8.3(L) 8.6(L) 9.1  Total Protein 6.5 - 8.1 g/dL - - -  Total Bilirubin 0.3 - 1.2 mg/dL - - -  Alkaline Phos 38 - 126 U/L - - -  AST 15 - 41 U/L - - -  ALT 0 - 44 U/L - - -     Imaging studies: No new pertinent imaging studies   Assessment/Plan: (ICD-10's: K31.51) 58 y.o. female with return of bowel function and clinically improving/resolved partial small bowel obstruction, likely attributable to post-surgical adhesions followinginfant open abdominal surgery for what sounds like omphalocele or similar, as well as c-section x3 and laparoscopic cholecystectomy, complicated by comorbidities includingobesity (BMI >35), hyperglycemia, and chronic anemia.   - Advance to full liquid diet + wean IVF   - Anti-emetics as needed  - Monitor abdominal examination; on-going bowel function  - No indication for surgical intervention at this time   - Medical management of comorbidities  - DVT Prophylaxis    - Discharge planning: Advance to soft diet evening vs tomorrow morning, hopefully home in  next 24-48 hours.    All of the above findings and recommendations were discussed with the patient, and the medical team, and all of patient's questions were answered to her expressed satisfaction.  -- Edison Simon, PA-C [facility] 08/31/2018, 10:09 AM [phone] M-F: 7am - 4pm  I saw and evaluated the patient.  I agree with the above documentation, exam, and plan, which I have edited where  appropriate. Fredirick Maudlin  10:15 AM

## 2018-09-01 NOTE — Discharge Summary (Signed)
Physician Discharge Summary  Patient ID: Victoria [name] MRN: [id number] DOB/AGE: 02-07-1961 58 y.o.  Admit date: 08/28/2018 Discharge date: 09/01/2018  Admission Diagnoses: Small bowel obstruction  Discharge Diagnoses:  Principal Problem:   SBO (small bowel obstruction) (HCC) Active Problems:   Hyperlipidemia   Discharged Condition: good  Hospital Course: Victoria [name] is a 58 year old woman who was admitted to the hospital with symptoms and imaging consistent with a partial small bowel obstruction.  Nasogastric decompression was performed with resolution of her obstructive symptoms.  Her diet was advanced with good tolerance.  On the day of discharge, she was passing gas, tolerating a regular diet, and had a normal abdominal exam.  Consults: None  Significant Diagnostic Studies: radiology: CLINICAL DATA:  Two day history of epigastric pain  EXAM: CT ABDOMEN AND PELVIS WITH CONTRAST  TECHNIQUE: Multidetector CT imaging of the abdomen and pelvis was performed using the standard protocol following bolus administration of intravenous contrast.  CONTRAST:  165mL OMNIPAQUE IOHEXOL 300 MG/ML  SOLN  COMPARISON:  None.  FINDINGS: Lower chest: No acute abnormality.  Hepatobiliary: Mild fatty infiltration of the liver is noted. The gallbladder has been surgically removed. No biliary ductal dilatation is noted.  Pancreas: Unremarkable. No pancreatic ductal dilatation or surrounding inflammatory changes.  Spleen: Normal in size without focal abnormality.  Adrenals/Urinary Tract: Adrenal glands are within normal limits. Kidneys are well visualize within normal enhancement pattern. No renal calculi or obstructive changes are noted. The bladder is partially distended.  Stomach/Bowel: Scattered diverticular change of the colon is noted. No evidence of diverticulitis is seen. Dilatation of the jejunum and proximal ileum is noted with fecalization of small bowel  contents consistent with delayed transition time and at least partial small bowel obstruction. Transition zone is noted in the left mid abdomen in what appears to be the mid to distal ileum. Some hyperemia is noted in this region although no definitive mass is seen.  Vascular/Lymphatic: No significant vascular findings are present. No enlarged abdominal or pelvic lymph nodes.  Reproductive: Bulky uterus is noted with scattered calcifications consistent with uterine fibroids.  Other: Small amount of free fluid is noted within the pelvis likely reactive in nature.  Musculoskeletal: Degenerative changes of lumbar spine are noted.  IMPRESSION: Small-bowel dilatation involving primarily the jejunum and proximal ileum with a transition zone in the left mid abdomen consistent with partial small bowel obstruction. No definitive mass lesion is seen. These changes raise suspicion for inflammatory bowel disease. No surgical changes to suggest adhesions are seen.  Fatty liver.  Uterine fibroids.   Treatments: IV hydration, NPO, NGT decompression  Discharge Exam: Blood pressure 134/64, pulse 62, temperature 98.2 F (36.8 C), temperature source Oral, resp. rate 18, height 5\' 3"  (1.6 m), weight 90.7 kg, SpO2 96 %. General appearance: alert, cooperative and no distress Resp: clear to auscultation bilaterally Cardio: regular rate and rhythm GI: soft, non-tender; bowel sounds normal; no masses,  no organomegaly  Disposition: Home.  She may follow-up on an as-needed basis.   Allergies as of 09/01/2018      Reactions   Codeine    REACTION: nausea and vomiting   Doxycycline Nausea Only     The patient had no home medications to resume.         Signed: Fredirick Maudlin 09/01/2018, 9:03 AM

## 2018-09-01 NOTE — Progress Notes (Signed)
Discharge order received. Patient is alert and oriented. Vital signs stable . No signs of acute distress. Discharge instructions given. Patient verbalized understanding. No other issues noted at this time.   

## 2018-11-27 ENCOUNTER — Telehealth: Payer: Self-pay

## 2018-11-27 NOTE — Telephone Encounter (Signed)
Left message for patient to return call regarding insurance form to verify surgery.

## 2018-11-28 NOTE — Telephone Encounter (Signed)
Patient is going to fax over another insurance form when she gets home today.

## 2019-04-01 ENCOUNTER — Encounter: Payer: Self-pay | Admitting: Family Medicine

## 2019-04-01 ENCOUNTER — Ambulatory Visit: Payer: Self-pay

## 2019-04-01 ENCOUNTER — Ambulatory Visit: Payer: No Typology Code available for payment source | Admitting: Family Medicine

## 2019-04-01 ENCOUNTER — Other Ambulatory Visit: Payer: Self-pay

## 2019-04-01 DIAGNOSIS — M25531 Pain in right wrist: Secondary | ICD-10-CM

## 2019-04-01 MED ORDER — DICLOFENAC SODIUM 1 % EX GEL: 4.0000 g | Freq: Four times a day (QID) | CUTANEOUS | 3 refills | Status: DC | PRN

## 2019-04-01 MED ORDER — TRAMADOL HCL 50 MG PO TABS: 50.0000 mg | ORAL_TABLET | Freq: Four times a day (QID) | ORAL | 0 refills | Status: DC | PRN

## 2019-04-01 NOTE — Progress Notes (Addendum)
Victoria [name] - 58 y.o. female MRN [id number]  Date of birth: 05-15-1961  Office Visit Note: Visit Date: 04/01/2019 PCP: Abner Greenspan, MD Referred by: Tower, Wynelle Fanny, MD  Subjective: Chief Complaint  Patient presents with  . Right Wrist - Pain    Pain radial aspect of wrist after lifting a table 4 weeks ago. Thursday, last week, she reached into a pocket for a key and felt a pop in the wrist - pain has been worse since. Right-hand dominant. Works as Probation officer.   HPI: Victoria [name] is a 58 y.o. female who comes in today with right wrist pain for the past 4 weeks, worsening in the past week. She reports that 4 weeks ago, she was lifting a table and felt pain in her wrist. The pain was mild at the time. Last week, she reached into her pocket across her body and felt a pop in her wrist. Since this happened, she has had sharp pain in the radial aspect of her wrist. She has been using biofreeze, ibuprofen, and a spica wrist splint that she had in the past for a thumb injury. She has noticed swelling in her wrist.    ROS Otherwise per HPI.  Assessment & Plan: Visit Diagnoses:  1. Pain in right wrist     Plan:  Pain on radial aspect of wrist with swelling synovial sheath in 1st compartment with abductor pollicis longus and extensor pollicis brevis. X-rays with no fracture consistent with DeQuervain's tenosynovitis. She opted for injection today for immediate relief as she works as a Theme park manager and pain is interfering with work.  Will trial voltaren gel and diclofenac.  Meds & Orders:  Meds ordered this encounter  Medications  . traMADol (ULTRAM) 50 MG tablet    Sig: Take 1-2 tablets (50-100 mg total) by mouth every 6 (six) hours as needed.    Dispense:  20 tablet    Refill:  0  . diclofenac Sodium (VOLTAREN) 1 % GEL    Sig: Apply 4 g topically 4 (four) times daily as needed.    Dispense:  500 g    Refill:  3    Orders Placed This Encounter  Procedures  . XR Wrist Complete  Right  . [facility] [name] - NO CHARGES    Follow-up: No follow-ups on file.   Procedures: No procedures performed  Procedure performed: DeQuervain's injection  Noted no overlying erythema, induration, or other signs of local infection. The 1st compartment palpated.  The overlying skin was prepped in a sterile fashion. Topical analgesic spray: Ethyl chloride. Needle: 25-gauge, 1.5 inch Using an in plane approach the needle was advanced into the tendon sheath and medications were injected.  Completed without difficulty. Meds: 40 mg solumedrol, 2 cc lidocaine  Clinical History: No specialty comments available.   She reports that she has never smoked. She has never used smokeless tobacco.  Recent Labs    06/05/18 0822  HGBA1C 6.0    Objective:  VS:  HT:    WT:   BMI:     BP:   HR: bpm  TEMP: ( )  RESP:  Physical Exam  PHYSICAL EXAM: Gen: NAD, alert, cooperative with exam, well-appearing HEENT: clear conjunctiva,  CV:  no edema, capillary refill brisk, normal rate Resp: non-labored Skin: no rashes, normal turgor  Neuro: no gross deficits.  Psych:  alert and oriented  Ortho Exam  Hand/wrist Exam Left/right -Inspection: No deformity, no discoloration -Palpation: distal radius: non-tender; Distal ulna:  non-tender; scaphoid: non-tender -ROM: Normal range of motion with flexion, extension, radial deviation, ulnar deviation, pronation, supination -Strength: Flexion: 5/5; Extension: 5/5; Radial Deviation: 5/5; Ulnar Deviation: 5/5 -Special Tests: Tinels: Negative; Phalens: Negative; Finkelstein's: Negative -Limb neurovascularly intact  Contralateral Hand/wrist -Inspection: No deformity, no discoloration -Palpation: distal radius: non-tender; Distal ulna: non-tender; scaphoid: non-tender -ROM: Normal range of motion with flexion, extension, radial deviation, ulnar deviation, pronation, supination -Strength: Flexion: 5/5; Extension: 5/5; Radial Deviation: 5/5; Ulnar Deviation: 5/5  -Limb neurovascularly intact  Imaging: [facility] [name] - No Charges  Result Date: 04/01/2019 Please see Notes tab for imaging impression.  Ultrasound of the right dorsal wrist: [facility]: No arthritic changes or effusion Compartment 1:  Tenosynovitis noted at abductor pollicis longus and extensor pollicis brevis  Compartment 2: Extensor carpi radialis longus and brevis are normal in appearance without tenosynovitis.  No inflammatory changes seen at the intersection of compartments 1 and 2. Compartment 3: Extensor pollicis longus appears normal with no evidence of tenosynovitis. Compartment 4: Extensor digitorum tendons and sensory indices appear normal. Compartment 5: Extensor digiti minimi I normal appearance without tenosynovitis Compartment 6: Extensor carpi ulnaris is normal appearance without tenosynovitis.    Impression: deQuervain's tenosynovitis  Past Medical/Family/Surgical/Social History: Medications & Allergies reviewed per EMR, new medications updated. Patient Active Problem List   Diagnosis Date Noted  . Nausea & vomiting 08/28/2018  . SBO (small bowel obstruction) ([place]) 08/28/2018  . Prediabetes 06/01/2018  . Panic attacks 02/22/2016  . Encounter for routine gynecological examination 09/02/2014  . Special screening for malignant neoplasms, colon 06/08/2011  . Hyperlipidemia 06/08/2011  . Routine general medical examination at a health care facility 06/01/2011  . LIPOMA 12/30/2009  . FIBROIDS, UTERUS 04/27/2009  . Obesity 03/19/2007  . ALLERGIC RHINITIS 02/27/2007   Past Medical History:  Diagnosis Date  . Allergic rhinitis   . Anemia, iron deficiency   . Heavy menses   . Hyperglycemia 02/22/2016  . Lipoma    on L arm  . Uterine fibroid    Family History  Problem Relation Age of Onset  . Diabetes Mother   . Hypertension Mother   . Hyperlipidemia Mother   . Cancer Mother        uterine cancer  . Lung cancer Mother        died 11  . Diabetes Father   . Alcohol  abuse Father   . Alcohol abuse Sister   . Hypertension Sister   . Diabetes Sister        boarderline  . Alcohol abuse Brother   . Hypertension Brother   . Diabetes Maternal Grandmother   . Alcohol abuse Paternal Grandfather   . Alcohol abuse Brother   . Alcohol abuse Brother   . Alcohol abuse Brother   . Diabetes Sister        severe  . Colon cancer Neg Hx   . Breast cancer Neg Hx    Past Surgical History:  Procedure Laterality Date  . CESAREAN SECTION    . CHOLECYSTECTOMY     11/07/11  . ENDOMETRIAL ABLATION     fibroids  . Pelvic U/S and transvaginal  04/27/2009   fibroids x 2  . TONSILLECTOMY    . TUBAL LIGATION    . Uterine ultrasound  06/1998   and endo biopsy- neg   Social History   Occupational History  . Occupation: Medical laboratory scientific officer: [employer]  Tobacco Use  . Smoking status: Never Smoker  . Smokeless tobacco: Never Used  Substance  and Sexual Activity  . Alcohol use: No    Alcohol/week: 0.0 standard drinks  . Drug use: No  . Sexual activity: Yes    Partners: Male    Birth control/protection: None

## 2019-04-01 NOTE — Progress Notes (Signed)
I saw and examined the patient with Dr. Mayer Masker and agree with assessment and plan as outlined.    Pain in the right wrist for about 4 weeks, much worse in the past week.  Pain is on the radial side and hurts with most movements.  Examination today is consistent with pain at the first dorsal compartment.  X-rays were unremarkable, ultrasound imaging shows thickening of the tendon sheath around the first dorsal compartment.  There is also hyperemia on power Doppler imaging.  Discussed various options with her and elected to treat with injection at the first dorsal compartment as well as Voltaren gel.  She will follow-up as needed.

## 2019-06-19 ENCOUNTER — Other Ambulatory Visit: Payer: Self-pay | Admitting: Family Medicine

## 2019-06-19 DIAGNOSIS — Z1231 Encounter for screening mammogram for malignant neoplasm of breast: Secondary | ICD-10-CM

## 2019-07-29 ENCOUNTER — Ambulatory Visit
Admission: RE | Admit: 2019-07-29 | Discharge: 2019-07-29 | Disposition: A | Discharge status: Home / Self Care | Payer: No Typology Code available for payment source | Source: Ambulatory Visit | Attending: Family Medicine | Admitting: Family Medicine

## 2019-07-29 ENCOUNTER — Other Ambulatory Visit: Payer: Self-pay

## 2019-07-29 DIAGNOSIS — Z1231 Encounter for screening mammogram for malignant neoplasm of breast: Secondary | ICD-10-CM

## 2019-12-23 ENCOUNTER — Encounter: Payer: Self-pay | Admitting: Family Medicine

## 2020-02-13 ENCOUNTER — Ambulatory Visit: Payer: No Typology Code available for payment source | Admitting: Family Medicine

## 2020-02-13 ENCOUNTER — Encounter: Payer: Self-pay | Admitting: Family Medicine

## 2020-02-13 ENCOUNTER — Other Ambulatory Visit: Payer: Self-pay

## 2020-02-13 VITALS — BP 136/78 | HR 74 | Temp 97.6°F | Wt 205.5 lb

## 2020-02-13 DIAGNOSIS — R300 Dysuria: Secondary | ICD-10-CM

## 2020-02-13 DIAGNOSIS — N3001 Acute cystitis with hematuria: Secondary | ICD-10-CM

## 2020-02-13 LAB — POC URINALSYSI DIPSTICK (AUTOMATED)
Bilirubin, UA: NEGATIVE
Glucose, UA: NEGATIVE
Ketones, UA: NEGATIVE
Nitrite, UA: NEGATIVE
Protein, UA: NEGATIVE
Spec Grav, UA: 1.01 (ref 1.010–1.025)
Urobilinogen, UA: 0.2 E.U./dL
pH, UA: 7.5 (ref 5.0–8.0)

## 2020-02-13 MED ORDER — SULFAMETHOXAZOLE-TRIMETHOPRIM 800-160 MG PO TABS: 1.0000 | ORAL_TABLET | Freq: Two times a day (BID) | ORAL | 0 refills | Status: AC

## 2020-02-13 NOTE — Progress Notes (Signed)
Subjective:     BERNA GITTO is a 59 y.o. female presenting for Dysuria (x 5 days ) and Urinary Frequency (x 5 days )     Dysuria  This is a new problem. The current episode started in the past 7 days. The problem has been gradually worsening. The quality of the pain is described as burning. There has been no fever. She is sexually active. There is no history of pyelonephritis. Associated symptoms include chills, frequency, hesitancy, nausea and urgency. Pertinent negatives include no flank pain, hematuria or vomiting. She has tried home medications (azo) for the symptoms. The treatment provided moderate relief. There is no history of recurrent UTIs.  Urinary Frequency  Associated symptoms include chills, frequency, hesitancy, nausea and urgency. Pertinent negatives include no flank pain, hematuria or vomiting. There is no history of recurrent UTIs.        Review of Systems  Constitutional: Positive for chills.  Gastrointestinal: Positive for nausea. Negative for vomiting.  Genitourinary: Positive for dysuria, frequency, hesitancy and urgency. Negative for flank pain and hematuria.     Social History   Tobacco Use  Smoking Status Never Smoker  Smokeless Tobacco Never Used        Objective:    BP Readings from Last 3 Encounters:  02/13/20 136/78  09/01/18 134/64  06/11/18 128/78   Wt Readings from Last 3 Encounters:  02/13/20 205 lb 8 oz (93.2 kg)  08/28/18 200 lb (90.7 kg)  06/11/18 203 lb (92.1 kg)    BP 136/78   Pulse 74   Temp 97.6 F (36.4 C) (Temporal)   Wt 205 lb 8 oz (93.2 kg)   SpO2 97%   BMI 36.40 kg/m    Physical Exam Constitutional:      General: She is not in acute distress.    Appearance: She is well-developed. She is not diaphoretic.  HENT:     Head: Normocephalic and atraumatic.  Eyes:     Conjunctiva/sclera: Conjunctivae normal.  Cardiovascular:     Rate and Rhythm: Normal rate.  Pulmonary:     Effort: Pulmonary effort is normal.   Abdominal:     General: Bowel sounds are normal. There is no distension.     Palpations: Abdomen is soft.     Tenderness: There is no abdominal tenderness. There is no right CVA tenderness, left CVA tenderness or guarding.  Musculoskeletal:     Cervical back: Neck supple.  Skin:    General: Skin is warm and dry.  Neurological:     Mental Status: She is alert.      UA: + blood, +LE, negative nitrites     Assessment & Plan:   Problem List Items Addressed This Visit    None    Visit Diagnoses    Acute cystitis with hematuria    -  Primary   Relevant Orders   Urine Culture   Dysuria       Relevant Medications   sulfamethoxazole-trimethoprim (BACTRIM DS) 800-160 MG tablet   Other Relevant Orders   POCT Urinalysis Dipstick (Automated) (Completed)     Reviewed prior UCx 2019 which was resistant to macrobid and Klebsiella  Previously responded to Bactrim will prescribe 3 day course F/u UCx  Return if symptoms worsen or fail to improve.  Lesleigh Noe, MD  This visit occurred during the SARS-CoV-2 public health emergency.  Safety protocols were in place, including screening questions prior to the visit, additional usage of staff PPE, and extensive cleaning of  exam room while observing appropriate contact time as indicated for disinfecting solutions.

## 2020-02-14 LAB — URINE CULTURE
MICRO NUMBER:: 11015792
SPECIMEN QUALITY:: ADEQUATE

## 2020-06-07 ENCOUNTER — Telehealth: Payer: Self-pay | Admitting: Family Medicine

## 2020-06-07 DIAGNOSIS — E7800 Pure hypercholesterolemia, unspecified: Secondary | ICD-10-CM

## 2020-06-07 DIAGNOSIS — Z0000 Encounter for general adult medical examination without abnormal findings: Secondary | ICD-10-CM

## 2020-06-07 DIAGNOSIS — R7303 Prediabetes: Secondary | ICD-10-CM

## 2020-06-07 NOTE — Telephone Encounter (Signed)
-----   Message from Cloyd Stagers, RT sent at 05/25/2020  1:50 PM EST ----- Regarding: Lab Orders for Monday 1.24.2022 Please place lab orders for Monday 1.24.2022, office visit for physical on Monday 1.31.2022 Thank you, Dyke Maes RT(R)

## 2020-06-08 ENCOUNTER — Other Ambulatory Visit (INDEPENDENT_AMBULATORY_CARE_PROVIDER_SITE_OTHER): Payer: No Typology Code available for payment source

## 2020-06-08 ENCOUNTER — Other Ambulatory Visit: Payer: Self-pay

## 2020-06-08 DIAGNOSIS — Z0000 Encounter for general adult medical examination without abnormal findings: Secondary | ICD-10-CM

## 2020-06-08 DIAGNOSIS — E7800 Pure hypercholesterolemia, unspecified: Secondary | ICD-10-CM

## 2020-06-08 DIAGNOSIS — R7303 Prediabetes: Secondary | ICD-10-CM | POA: Diagnosis not present

## 2020-06-08 LAB — LIPID PANEL
Cholesterol: 204 mg/dL — ABNORMAL HIGH (ref 0–200)
HDL: 45.1 mg/dL (ref >39.00)
LDL Cholesterol: 134 mg/dL — ABNORMAL HIGH (ref 0–99)
NonHDL: 158.92
Total CHOL/HDL Ratio: 5
Triglycerides: 124 mg/dL (ref 0.0–149.0)
VLDL: 24.8 mg/dL (ref 0.0–40.0)

## 2020-06-08 LAB — HEMOGLOBIN A1C: Hgb A1c MFr Bld: 6 % (ref 4.6–6.5)

## 2020-06-08 LAB — CBC WITH DIFFERENTIAL/PLATELET
Basophils Absolute: 0 10*3/uL (ref 0.0–0.1)
Basophils Relative: 0.7 % (ref 0.0–3.0)
Eosinophils Absolute: 0.1 10*3/uL (ref 0.0–0.7)
Eosinophils Relative: 2.1 % (ref 0.0–5.0)
HCT: 39 % (ref 36.0–46.0)
Hemoglobin: 13.4 g/dL (ref 12.0–15.0)
Lymphocytes Relative: 30.7 % (ref 12.0–46.0)
Lymphs Abs: 2.2 10*3/uL (ref 0.7–4.0)
MCHC: 34.5 g/dL (ref 30.0–36.0)
MCV: 80.5 fl (ref 78.0–100.0)
Monocytes Absolute: 0.4 10*3/uL (ref 0.1–1.0)
Monocytes Relative: 5.5 % (ref 3.0–12.0)
Neutro Abs: 4.3 10*3/uL (ref 1.4–7.7)
Neutrophils Relative %: 61 % (ref 43.0–77.0)
Platelets: 287 10*3/uL (ref 150.0–400.0)
RBC: 4.84 Mil/uL (ref 3.87–5.11)
RDW: 13 % (ref 11.5–15.5)
WBC: 7.1 10*3/uL (ref 4.0–10.5)

## 2020-06-08 LAB — COMPREHENSIVE METABOLIC PANEL
ALT: 17 U/L (ref 0–35)
AST: 15 U/L (ref 0–37)
Albumin: 4.4 g/dL (ref 3.5–5.2)
Alkaline Phosphatase: 78 U/L (ref 39–117)
BUN: 11 mg/dL (ref 6–23)
CO2: 28 mEq/L (ref 19–32)
Calcium: 9.6 mg/dL (ref 8.4–10.5)
Chloride: 103 mEq/L (ref 96–112)
Creatinine, Ser: 0.75 mg/dL (ref 0.40–1.20)
GFR: 87.25 mL/min (ref >60.00)
Glucose, Bld: 96 mg/dL (ref 70–99)
Potassium: 4.2 mEq/L (ref 3.5–5.1)
Sodium: 138 mEq/L (ref 135–145)
Total Bilirubin: 0.4 mg/dL (ref 0.2–1.2)
Total Protein: 6.8 g/dL (ref 6.0–8.3)

## 2020-06-08 LAB — TSH: TSH: 2.6 u[IU]/mL (ref 0.35–4.50)

## 2020-06-15 ENCOUNTER — Encounter: Payer: Self-pay | Admitting: Family Medicine

## 2020-06-15 ENCOUNTER — Ambulatory Visit (INDEPENDENT_AMBULATORY_CARE_PROVIDER_SITE_OTHER): Payer: No Typology Code available for payment source | Admitting: Family Medicine

## 2020-06-15 ENCOUNTER — Other Ambulatory Visit: Payer: Self-pay

## 2020-06-15 VITALS — BP 130/75 | HR 80 | Temp 97.2°F | Ht 63.0 in | Wt 206.5 lb

## 2020-06-15 DIAGNOSIS — R7303 Prediabetes: Secondary | ICD-10-CM | POA: Diagnosis not present

## 2020-06-15 DIAGNOSIS — Z0000 Encounter for general adult medical examination without abnormal findings: Secondary | ICD-10-CM | POA: Diagnosis not present

## 2020-06-15 DIAGNOSIS — E6609 Other obesity due to excess calories: Secondary | ICD-10-CM

## 2020-06-15 DIAGNOSIS — E7800 Pure hypercholesterolemia, unspecified: Secondary | ICD-10-CM

## 2020-06-15 DIAGNOSIS — Z6836 Body mass index (BMI) 36.0-36.9, adult: Secondary | ICD-10-CM

## 2020-06-15 NOTE — Assessment & Plan Note (Signed)
bmi 38 Discussed how this problem influences overall health and the risks it imposes  Reviewed plan for weight loss with lower calorie diet (via better food choices and also portion control or program like weight watchers) and exercise building up to or more than 30 minutes 5 days per week including some aerobic activity

## 2020-06-15 NOTE — Assessment & Plan Note (Signed)
Reviewed health habits including diet and exercise and skin cancer prevention Reviewed appropriate screening tests for age  Also reviewed health mt list, fam hx and immunization status , as well as social and family history   See HPI Labs reviewed  Declines flu shot  Is covid immunized  Declines shingrix  Mammogram and colonoscopy utd

## 2020-06-15 NOTE — Assessment & Plan Note (Signed)
Lab Results  Component Value Date   HGBA1C 6.0 06/08/2020   Stable  disc imp of low glycemic diet and wt loss to prevent DM2

## 2020-06-15 NOTE — Patient Instructions (Addendum)
Try to get most of your carbohydrates from produce (with the exception of white potatoes)  Eat less bread/pasta/rice/snack foods/cereals/sweets and other items from the middle of the grocery store (processed carbs)  Add more exercise   For cholesterol  Avoid red meat/ fried foods/ egg yolks/ fatty breakfast meats/ butter, cheese and high fat dairy/ and shellfish    Take care of yourself

## 2020-06-15 NOTE — Assessment & Plan Note (Signed)
Disc goals for lipids and reasons to control them Rev last labs with pt Rev low sat fat diet in detail LDL went up to 134 ? Diet vs genetics or both  Given handouts re: diet and plan to cut back on butter and fried foods Consider statin in future if not improved

## 2020-06-15 NOTE — Progress Notes (Signed)
Subjective:    Patient ID: Victoria [name], female    DOB: [birth year], 60 y.o.   MRN: [id number]  This visit occurred during the SARS-CoV-2 public health emergency.  Safety protocols were in place, including screening questions prior to the visit, additional usage of staff PPE, and extensive cleaning of exam room while observing appropriate contact time as indicated for disinfecting solutions.    HPI Here for health maintenance exam and to review chronic medical problems   Wt Readings from Last 3 Encounters:  06/15/20 206 lb 8 oz (93.7 kg)  02/13/20 205 lb 8 oz (93.2 kg)  08/28/18 200 lb (90.7 kg)   36.58 kg/m  Doing ok overall  Wants to get outside more  Some exercise in the house  On feet all day   Eating healthy also    Flu shot-declines  covid status -moderna vaccine with booster  Tdap 4/16 Zoster status -not interested in vaccine   Mammogram 3/21 Self breast exam no lumps   Pap 12/20-normal  No symptoms  No new partners  No bleeding   Colonoscopy 3/13   BP Readings from Last 3 Encounters:  06/15/20 130/75  02/13/20 136/78  09/01/18 134/64     Pulse Readings from Last 3 Encounters:  06/15/20 80  02/13/20 74  09/01/18 62    Prediabetes Lab Results  Component Value Date   HGBA1C 6.0 06/08/2020  no change  96 glucose fasting    Hyperlipidemia Lab Results  Component Value Date   CHOL 204 (H) 06/08/2020   CHOL 179 06/05/2018   CHOL 175 02/20/2017   Lab Results  Component Value Date   HDL 45.10 06/08/2020   HDL 40.40 06/05/2018   HDL 37.30 (L) 02/20/2017   Lab Results  Component Value Date   LDLCALC 134 (H) 06/08/2020   LDLCALC 117 (H) 06/05/2018   LDLCALC 124 (H) 02/20/2017   Lab Results  Component Value Date   TRIG 124.0 06/08/2020   TRIG 112.0 06/05/2018   TRIG 73.0 02/20/2017   Lab Results  Component Value Date   CHOLHDL 5 06/08/2020   CHOLHDL 4 06/05/2018   CHOLHDL 5 02/20/2017   No results found for: LDLDIRECT Thought  she was doing better with diet  Too much butter  Some eggs  occ french fries  occ bacon/sausage   Other labs Lab Results  Component Value Date   CREATININE 0.75 06/08/2020   BUN 11 06/08/2020   NA 138 06/08/2020   K 4.2 06/08/2020   CL 103 06/08/2020   CO2 28 06/08/2020   Lab Results  Component Value Date   ALT 17 06/08/2020   AST 15 06/08/2020   ALKPHOS 78 06/08/2020   BILITOT 0.4 06/08/2020   Lab Results  Component Value Date   WBC 7.1 06/08/2020   HGB 13.4 06/08/2020   HCT 39.0 06/08/2020   MCV 80.5 06/08/2020   PLT 287.0 06/08/2020   Lab Results  Component Value Date   TSH 2.60 06/08/2020     Patient Active Problem List   Diagnosis Date Noted  . H/O small bowel obstruction 08/28/2018  . Prediabetes 06/01/2018  . Panic attacks 02/22/2016  . Encounter for routine gynecological examination 09/02/2014  . Special screening for malignant neoplasms, colon 06/08/2011  . Hyperlipidemia 06/08/2011  . Routine general medical examination at a health care facility 06/01/2011  . LIPOMA 12/30/2009  . FIBROIDS, UTERUS 04/27/2009  . Obesity 03/19/2007  . ALLERGIC RHINITIS 02/27/2007   Past Medical History:  Diagnosis Date  .  Allergic rhinitis   . Anemia, iron deficiency   . Heavy menses   . Hyperglycemia 02/22/2016  . Lipoma    on L arm  . Uterine fibroid    Past Surgical History:  Procedure Laterality Date  . CESAREAN SECTION    . CHOLECYSTECTOMY     11/07/11  . ENDOMETRIAL ABLATION     fibroids  . Pelvic U/S and transvaginal  04/27/2009   fibroids x 2  . TONSILLECTOMY    . TUBAL LIGATION    . Uterine ultrasound  06/1998   and endo biopsy- neg   Social History   Tobacco Use  . Smoking status: Never Smoker  . Smokeless tobacco: Never Used  Substance Use Topics  . Alcohol use: No    Alcohol/week: 0.0 standard drinks  . Drug use: No   Family History  Problem Relation Age of Onset  . Diabetes Mother   . Hypertension Mother   . Hyperlipidemia  Mother   . Cancer Mother        uterine cancer  . Lung cancer Mother        died 12  . Diabetes Father   . Alcohol abuse Father   . Alcohol abuse Sister   . Hypertension Sister   . Diabetes Sister        boarderline  . Alcohol abuse Brother   . Hypertension Brother   . Diabetes Maternal Grandmother   . Alcohol abuse Paternal Grandfather   . Alcohol abuse Brother   . Alcohol abuse Brother   . Alcohol abuse Brother   . Diabetes Sister        severe  . Colon cancer Neg Hx   . Breast cancer Neg Hx    Allergies  Allergen Reactions  . Codeine     REACTION: nausea and vomiting  . Doxycycline Nausea Only   Current Outpatient Medications on File Prior to Visit  Medication Sig Dispense Refill  . diclofenac Sodium (VOLTAREN) 1 % GEL Apply 4 g topically 4 (four) times daily as needed. 500 g 3  . Multiple Vitamin (MULTIVITAMIN) tablet Take 1 tablet by mouth daily.     No current facility-administered medications on file prior to visit.    Review of Systems  Constitutional: Negative for activity change, appetite change, fatigue, fever and unexpected weight change.  HENT: Negative for congestion, ear pain, rhinorrhea, sinus pressure and sore throat.   Eyes: Negative for pain, redness and visual disturbance.  Respiratory: Negative for cough, shortness of breath and wheezing.   Cardiovascular: Negative for chest pain and palpitations.  Gastrointestinal: Negative for abdominal pain, blood in stool, constipation and diarrhea.  Endocrine: Negative for polydipsia and polyuria.  Genitourinary: Negative for dysuria, frequency and urgency.  Musculoskeletal: Negative for arthralgias, back pain and myalgias.  Skin: Negative for pallor and rash.  Allergic/Immunologic: Negative for environmental allergies.  Neurological: Negative for dizziness, syncope and headaches.  Hematological: Negative for adenopathy. Does not bruise/bleed easily.  Psychiatric/Behavioral: Negative for decreased  concentration and dysphoric mood. The patient is not nervous/anxious.        Objective:   Physical Exam Constitutional:      General: She is not in acute distress.    Appearance: Normal appearance. She is well-developed. She is obese. She is not ill-appearing or diaphoretic.  HENT:     Head: Normocephalic and atraumatic.     Right Ear: Tympanic membrane, ear canal and external ear normal.     Left Ear: Tympanic membrane, ear canal and  external ear normal.     Nose: Nose normal. No congestion.     Mouth/Throat:     Mouth: Mucous membranes are moist.     Pharynx: Oropharynx is clear. No posterior oropharyngeal erythema.  Eyes:     General: No scleral icterus.    Extraocular Movements: Extraocular movements intact.     Conjunctiva/sclera: Conjunctivae normal.     Pupils: Pupils are equal, round, and reactive to light.  Neck:     Thyroid: No thyromegaly.     Vascular: No carotid bruit or JVD.  Cardiovascular:     Rate and Rhythm: Normal rate and regular rhythm.     Pulses: Normal pulses.     Heart sounds: Normal heart sounds. No gallop.   Pulmonary:     Effort: Pulmonary effort is normal. No respiratory distress.     Breath sounds: Normal breath sounds. No wheezing.     Comments: Good air exch Chest:     Chest wall: No tenderness.  Abdominal:     General: Bowel sounds are normal. There is no distension or abdominal bruit.     Palpations: Abdomen is soft. There is no mass.     Tenderness: There is no abdominal tenderness.     Hernia: No hernia is present.  Genitourinary:    Comments: Breast exam: No mass, nodules, thickening, tenderness, bulging, retraction, inflamation, nipple discharge or skin changes noted.  No axillary or clavicular LA.     Musculoskeletal:        General: No tenderness. Normal range of motion.     Cervical back: Normal range of motion and neck supple. No rigidity. No muscular tenderness.     Right lower leg: No edema.     Left lower leg: No edema.   Lymphadenopathy:     Cervical: No cervical adenopathy.  Skin:    General: Skin is warm and dry.     Coloration: Skin is not pale.     Findings: No erythema or rash.     Comments: Some skin tags on neck  Neurological:     Mental Status: She is alert. Mental status is at baseline.     Cranial Nerves: No cranial nerve deficit.     Motor: No abnormal muscle tone.     Coordination: Coordination normal.     Gait: Gait normal.     Deep Tendon Reflexes: Reflexes are normal and symmetric. Reflexes normal.  Psychiatric:        Mood and Affect: Mood normal.        Cognition and Memory: Cognition and memory normal.           Assessment & Plan:   Problem List Items Addressed This Visit      Other   Obesity    bmi 36 Discussed how this problem influences overall health and the risks it imposes  Reviewed plan for weight loss with lower calorie diet (via better food choices and also portion control or program like weight watchers) and exercise building up to or more than 30 minutes 5 days per week including some aerobic activity         Routine general medical examination at a health care facility - Primary    Reviewed health habits including diet and exercise and skin cancer prevention Reviewed appropriate screening tests for age  Also reviewed health mt list, fam hx and immunization status , as well as social and family history   See HPI Labs reviewed  Declines flu shot  Is covid immunized  Declines shingrix  Mammogram and colonoscopy utd        Hyperlipidemia    Disc goals for lipids and reasons to control them Rev last labs with pt Rev low sat fat diet in detail LDL went up to 134 ? Diet vs genetics or both  Given handouts re: diet and plan to cut back on butter and fried foods Consider statin in future if not improved      Prediabetes    Lab Results  Component Value Date   HGBA1C 6.0 06/08/2020   Stable  disc imp of low glycemic diet and wt loss to prevent DM2

## 2020-07-08 ENCOUNTER — Other Ambulatory Visit: Payer: Self-pay | Admitting: Family Medicine

## 2020-07-08 DIAGNOSIS — Z1231 Encounter for screening mammogram for malignant neoplasm of breast: Secondary | ICD-10-CM

## 2020-07-21 ENCOUNTER — Ambulatory Visit (INDEPENDENT_AMBULATORY_CARE_PROVIDER_SITE_OTHER): Payer: No Typology Code available for payment source | Admitting: Family Medicine

## 2020-07-23 ENCOUNTER — Ambulatory Visit (INDEPENDENT_AMBULATORY_CARE_PROVIDER_SITE_OTHER): Payer: No Typology Code available for payment source | Admitting: Family Medicine

## 2020-07-23 ENCOUNTER — Encounter (INDEPENDENT_AMBULATORY_CARE_PROVIDER_SITE_OTHER): Payer: Self-pay | Admitting: Family Medicine

## 2020-07-23 ENCOUNTER — Other Ambulatory Visit: Payer: Self-pay

## 2020-07-23 VITALS — BP 130/78 | HR 75 | Temp 98.2°F | Ht 63.0 in | Wt 205.0 lb

## 2020-07-23 DIAGNOSIS — M549 Dorsalgia, unspecified: Secondary | ICD-10-CM

## 2020-07-23 DIAGNOSIS — Z1331 Encounter for screening for depression: Secondary | ICD-10-CM | POA: Diagnosis not present

## 2020-07-23 DIAGNOSIS — E6609 Other obesity due to excess calories: Secondary | ICD-10-CM

## 2020-07-23 DIAGNOSIS — R5383 Other fatigue: Secondary | ICD-10-CM | POA: Diagnosis not present

## 2020-07-23 DIAGNOSIS — E7849 Other hyperlipidemia: Secondary | ICD-10-CM | POA: Diagnosis not present

## 2020-07-23 DIAGNOSIS — R7303 Prediabetes: Secondary | ICD-10-CM | POA: Diagnosis not present

## 2020-07-23 DIAGNOSIS — E7800 Pure hypercholesterolemia, unspecified: Secondary | ICD-10-CM | POA: Insufficient documentation

## 2020-07-23 DIAGNOSIS — D649 Anemia, unspecified: Secondary | ICD-10-CM

## 2020-07-23 DIAGNOSIS — Z9189 Other specified personal risk factors, not elsewhere classified: Secondary | ICD-10-CM | POA: Diagnosis not present

## 2020-07-23 DIAGNOSIS — R0602 Shortness of breath: Secondary | ICD-10-CM | POA: Diagnosis not present

## 2020-07-23 DIAGNOSIS — Z0289 Encounter for other administrative examinations: Secondary | ICD-10-CM

## 2020-07-23 DIAGNOSIS — E66812 Obesity, class 2: Secondary | ICD-10-CM

## 2020-07-23 DIAGNOSIS — Z6836 Body mass index (BMI) 36.0-36.9, adult: Secondary | ICD-10-CM

## 2020-07-24 LAB — COMPREHENSIVE METABOLIC PANEL
ALT: 17 IU/L (ref 0–32)
AST: 16 IU/L (ref 0–40)
Albumin/Globulin Ratio: 1.7 (ref 1.2–2.2)
Albumin: 4.7 g/dL (ref 3.8–4.9)
Alkaline Phosphatase: 91 IU/L (ref 44–121)
BUN/Creatinine Ratio: 12 (ref 9–23)
BUN: 8 mg/dL (ref 6–24)
Bilirubin Total: 0.3 mg/dL (ref 0.0–1.2)
CO2: 22 mmol/L (ref 20–29)
Calcium: 9.5 mg/dL (ref 8.7–10.2)
Chloride: 102 mmol/L (ref 96–106)
Creatinine, Ser: 0.68 mg/dL (ref 0.57–1.00)
Globulin, Total: 2.7 g/dL (ref 1.5–4.5)
Glucose: 90 mg/dL (ref 65–99)
Potassium: 4.4 mmol/L (ref 3.5–5.2)
Sodium: 141 mmol/L (ref 134–144)
Total Protein: 7.4 g/dL (ref 6.0–8.5)
eGFR: 100 mL/min/{1.73_m2} (ref >59)

## 2020-07-24 LAB — LIPID PANEL
Chol/HDL Ratio: 4.6 ratio — ABNORMAL HIGH (ref 0.0–4.4)
Cholesterol, Total: 203 mg/dL — ABNORMAL HIGH (ref 100–199)
HDL: 44 mg/dL (ref >39)
LDL Chol Calc (NIH): 138 mg/dL — ABNORMAL HIGH (ref 0–99)
Triglycerides: 114 mg/dL (ref 0–149)
VLDL Cholesterol Cal: 21 mg/dL (ref 5–40)

## 2020-07-24 LAB — VITAMIN D 25 HYDROXY (VIT D DEFICIENCY, FRACTURES): Vit D, 25-Hydroxy: 36.9 ng/mL (ref 30.0–100.0)

## 2020-07-24 LAB — CBC WITH DIFFERENTIAL/PLATELET
Basophils Absolute: 0.1 10*3/uL (ref 0.0–0.2)
Basos: 1 %
EOS (ABSOLUTE): 0.1 10*3/uL (ref 0.0–0.4)
Eos: 1 %
Hematocrit: 44.9 % (ref 34.0–46.6)
Hemoglobin: 14.6 g/dL (ref 11.1–15.9)
Immature Grans (Abs): 0 10*3/uL (ref 0.0–0.1)
Immature Granulocytes: 1 %
Lymphocytes Absolute: 1.7 10*3/uL (ref 0.7–3.1)
Lymphs: 26 %
MCH: 26.9 pg (ref 26.6–33.0)
MCHC: 32.5 g/dL (ref 31.5–35.7)
MCV: 83 fL (ref 79–97)
Monocytes Absolute: 0.3 10*3/uL (ref 0.1–0.9)
Monocytes: 5 %
Neutrophils Absolute: 4.4 10*3/uL (ref 1.4–7.0)
Neutrophils: 66 %
Platelets: 292 10*3/uL (ref 150–450)
RBC: 5.42 x10E6/uL — ABNORMAL HIGH (ref 3.77–5.28)
RDW: 12.5 % (ref 11.7–15.4)
WBC: 6.6 10*3/uL (ref 3.4–10.8)

## 2020-07-24 LAB — FOLATE: Folate: 10.9 ng/mL (ref >3.0)

## 2020-07-24 LAB — HEMOGLOBIN A1C
Est. average glucose Bld gHb Est-mCnc: 126 mg/dL
Hgb A1c MFr Bld: 6 % — ABNORMAL HIGH (ref 4.8–5.6)

## 2020-07-24 LAB — INSULIN, RANDOM: INSULIN: 15.7 u[IU]/mL (ref 2.6–24.9)

## 2020-07-24 LAB — VITAMIN B12: Vitamin B-12: 556 pg/mL (ref 232–1245)

## 2020-07-24 LAB — T4, FREE: Free T4: 1.12 ng/dL (ref 0.82–1.77)

## 2020-07-24 LAB — TSH: TSH: 1.62 u[IU]/mL (ref 0.450–4.500)

## 2020-07-24 LAB — T3: T3, Total: 104 ng/dL (ref 71–180)

## 2020-07-29 NOTE — Progress Notes (Signed)
Chief Complaint:   OBESITY Victoria [name] (MR# [id number]) is a 60 y.o. female who presents for evaluation and treatment of obesity and related comorbidities. Current BMI is Body mass index is 36.31 kg/m. Victoria [name] has been struggling with her weight for many years and has been unsuccessful in either losing weight, maintaining weight loss, or reaching her healthy weight goal.  Victoria [name] is currently in the action stage of change and ready to dedicate time achieving and maintaining a healthier weight. Victoria [name] is interested in becoming our patient and working on intensive lifestyle modifications including (but not limited to) diet and exercise for weight loss.  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  She lives with her husband, Victoria [name].  She craves sweets/chocolate.  She skips breakfast and lunch most days.  She says she is a, "very picky eater".  She says she drinks a lot of caloric beverages.  Victoria [name]'s habits were reviewed today and are as follows: Her family eats meals together, she thinks her family will eat healthier with her, she struggles with family and or coworkers weight loss sabotage, her desired weight loss is 45 pounds, she has been heavy most of her life, she started gaining weight after age 89, she is a picky eater and doesn't like to eat healthier foods, she craves sweets, she skips breakfast and/or lunch frequently, she is frequently drinking liquids with calories, she frequently makes poor food choices and she struggles with emotional eating.  Depression Screen Victoria [name]'s Food and Mood (modified PHQ-9) score was 2.  Depression screen [facility] 2/9 07/23/2020  Decreased Interest 0  Down, Depressed, Hopeless 0  PHQ - 2 Score 0  Altered sleeping 0  Tired, decreased energy 1  Change in appetite 1  Feeling bad or failure about yourself  0  Trouble concentrating 0  Moving slowly or fidgety/restless 0  Suicidal thoughts 0  PHQ-9 Score 2  Difficult doing work/chores Not difficult at all    Assessment/Plan:   1. Other fatigue Victoria [name] denies daytime somnolence and reports waking up still tired. Patent has a history of symptoms of morning fatigue and snoring. Victoria [name] generally gets 7 or 8 hours of sleep per night, and states that she has generally restful sleep. Snoring is present. Apneic episodes are not present. Epworth Sleepiness Score is 3.  Victoria [name] does feel that her weight is causing her energy to be lower than it should be. Fatigue may be related to obesity, depression or many other causes. Labs will be ordered, and in the meanwhile, Victoria [name] will focus on self care including making healthy food choices, increasing physical activity and focusing on stress reduction.  Will check EKG and labs today.  - EKG 12-Lead - CBC with Differential/Platelet - T3 - T4, free - TSH - VITAMIN D 25 Hydroxy (Vit-D Deficiency, Fractures)  2. SOB (shortness of breath) on exertion Victoria [name] notes increasing shortness of breath with exercising and seems to be worsening over time with weight gain. She notes getting out of breath sooner with activity than she used to. This has gotten worse recently. Victoria [name] denies shortness of breath at rest or orthopnea.  Victoria [name] does feel that she gets out of breath more easily that she used to when she exercises. Victoria [name]'s shortness of breath appears to be obesity related and exercise induced. She has agreed to work on weight loss and gradually increase exercise to treat her exercise induced shortness of breath. Will continue to monitor closely.  Will check IC and labs today.  3.  Other hyperlipidemia Course: Not at goal. Lipid-lowering medications: None.   Plan: Dietary changes: Increase soluble fiber, decrease simple carbohydrates, decrease saturated fat. Exercise changes: Moderate to vigorous-intensity aerobic activity 150 minutes per week or as tolerated. We will continue to monitor along with PCP/specialists as it pertains to her weight loss journey.  Will check lipid panel  today.  The 10-year ASCVD risk score Victoria [name] DC Brooke Bonito., et al., 2013) is: 6.8%   Values used to calculate the score:     Age: 42 years     Sex: Female     Is Non-Hispanic African American: Yes     Diabetic: No     Tobacco smoker: No     Systolic Blood Pressure: 761 mmHg     Is BP treated: No     HDL Cholesterol: 44 mg/dL     Total Cholesterol: 203 mg/dL  - Lipid panel  4. Prediabetes Not at goal. Goal is HgbA1c < 5.7.  Medication: None.    Plan:  She will continue to focus on protein-rich, low simple carbohydrate foods. We reviewed the importance of hydration, regular exercise for stress reduction, and restorative sleep.  Will check labs today.  Lab Results  Component Value Date   HGBA1C 6.0 (H) 06/08/2020   - Comprehensive metabolic panel - Hemoglobin A1c - Insulin, random  5. Anemia, unspecified type Victoria [name] is taking a daily multivitamin.  Plan:  Will check labs today.  Nutrition: Iron-rich foods include dark leafy greens, red and white meats, eggs, seafood, and beans.  Certain foods and drinks prevent your body from absorbing iron properly. Avoid eating these foods in the same meal as iron-rich foods or with iron supplements. These foods include: coffee, black tea, and red wine; milk, dairy products, and foods that are high in calcium; beans and soybeans; whole grains. Constipation can be a side effect of iron supplementation. Increased water and fiber intake are helpful. Water goal: > 2 liters/day. Fiber goal: > 25 grams/day.  CBC Latest Ref Rng & Units 06/08/2020 08/30/2018  WBC 3.4 - 10.8 x10E3/uL 7.1 11.0(H)  Hemoglobin 11.1 - 15.9 g/dL 13.4 13.5  Hematocrit 34.0 - 46.6 % 39.0 41.6  Platelets 150 - 450 x10E3/uL 287.0 304   Lab Results  Component Value Date   IRON 67 05/31/2010   - Vitamin B12 - CBC with Differential/Platelet - Folate  6. Back pain, unspecified back location, unspecified back pain laterality, unspecified chronicity Victoria [name] takes OTC ibuprofen as needed  for back pain.  Plan:  Will follow because mobility and pain control are important for weight management.   7. Depression screening Victoria [name] was screened for depression as part of her new patient workup today.  PHQ-9 is 2.  8. At risk for heart disease Due to Victoria [name]'s current state of health and medical condition(s), she is at a higher risk for heart disease.  This puts the patient at much greater risk to subsequently develop cardiopulmonary conditions that can significantly affect patient's quality of life in a negative manner.    At least 8 minutes were spent on counseling Kambryn about these concerns today, and I stressed the importance of reversing risks factors of obesity, especially truncal and visceral fat, hypertension, hyperlipidemia, and pre-diabetes.  The initial goal is to lose at least 5-10% of starting weight to help reduce these risk factors.  Counseling:  Intensive lifestyle modifications were discussed with Victoria [name] as the most appropriate first line of treatment.  she will continue to work on diet, exercise, and weight  loss efforts.  We will continue to reassess these conditions on a fairly regular basis in an attempt to decrease the patient's overall morbidity and mortality.  Evidence-based interventions for health behavior change were utilized today including the discussion of self monitoring techniques, problem-solving barriers, and SMART goal setting techniques.  Specifically, regarding patient's less desirable eating habits and patterns, we employed the technique of small changes when Victoria [name] has not been able to fully commit to her prudent nutritional plan.  9. Class 2 severe obesity with serious comorbidity and body mass index (BMI) of 36.0 to 36.9 in adult, unspecified obesity type (HCC)  Victoria [name] is currently in the action stage of change and her goal is to continue with weight loss efforts. I recommend Victoria [name] begin the structured treatment plan as follows:  She has agreed to the  Category 1 Plan.  Exercise goals: As is.   Behavioral modification strategies: increasing lean protein intake, decreasing simple carbohydrates, no skipping meals, meal planning and cooking strategies and planning for success.  She was informed of the importance of frequent follow-up visits to maximize her success with intensive lifestyle modifications for her multiple health conditions. She was informed we would discuss her lab results at her next visit unless there is a critical issue that needs to be addressed sooner. Victoria [name] agreed to keep her next visit at the agreed upon time to discuss these results.  Objective:   Blood pressure 130/78, pulse 75, temperature 98.2 F (36.8 C), height 5\' 3"  (1.6 m), weight 205 lb (93 kg), SpO2 98 %. Body mass index is 36.31 kg/m.  EKG: Normal sinus rhythm, rate 77 bpm.  Indirect Calorimeter completed today shows a VO2 of 199 and a REE of 1388.  Her calculated basal metabolic rate is 9629 thus her basal metabolic rate is worse than expected.  General: Cooperative, alert, well developed, in no acute distress. HEENT: Conjunctivae and lids unremarkable. Cardiovascular: Regular rhythm.  Lungs: Normal work of breathing. Neurologic: No focal deficits.   Lab Results  Component Value Date   HGBA1C 6.0 06/08/2020   HGBA1C 6.0 06/05/2018   HGBA1C 6.0 02/20/2017   HGBA1C 5.9 05/24/2016   Lab Results  Component Value Date   IRON 67 05/31/2010   Attestation Statements:   This is the patient's first visit at [facility]. The patient's NEW PATIENT PACKET was reviewed at length. Included in the packet: current and past health history, medications, allergies, ROS, gynecologic history (women only), surgical history, family history, social history, weight history, weight loss surgery history (for those that have had weight loss surgery), nutritional evaluation, mood and food questionnaire, PHQ9, Epworth questionnaire, sleep habits questionnaire,  patient life and health improvement goals questionnaire. These will all be scanned into the patient's chart under media.   During the visit, I independently reviewed the patient's EKG, bioimpedance scale results, and indirect calorimeter results. I used this information to tailor a meal plan for the patient that will help her to lose weight and will improve her obesity-related conditions going forward. I performed a medically necessary appropriate examination and/or evaluation. I discussed the assessment and treatment plan with the patient. The patient was provided an opportunity to ask questions and all were answered. The patient agreed with the plan and demonstrated an understanding of the instructions. Labs were ordered at this visit and will be reviewed at the next visit unless more critical results need to be addressed immediately. Clinical information was updated and documented in the EMR.   I, [employer]  Agner, CMA, am acting as transcriptionist for [employer], DO.  I have reviewed the above documentation for accuracy and completeness, and I agree with the above. Marjory Sneddon, D.O.  The [place] was signed into law in 2016 which includes the topic of electronic health records.  This provides immediate access to information in MyChart.  This includes consultation notes, operative notes, office notes, lab results and pathology reports.  If you have any questions about what you read please let us know at your next visit so we can discuss your concerns and take corrective action if need be.  We are right here with you.  Orders Placed This Encounter  Procedures  . Vitamin B12  . CBC with Differential/Platelet  . Comprehensive metabolic panel  . Folate  . Hemoglobin A1c  . Insulin, random  . Lipid panel  . T3  . T4, free  . TSH  . VITAMIN D 25 Hydroxy (Vit-D Deficiency, Fractures)  . EKG 12-Lead    Medications Discontinued During This Encounter  Medication Reason  .  diclofenac Sodium (VOLTAREN) 1 % GEL      No orders of the defined types were placed in this encounter.

## 2020-08-04 ENCOUNTER — Ambulatory Visit (INDEPENDENT_AMBULATORY_CARE_PROVIDER_SITE_OTHER): Payer: No Typology Code available for payment source | Admitting: Family Medicine

## 2020-08-06 ENCOUNTER — Other Ambulatory Visit: Payer: Self-pay

## 2020-08-06 ENCOUNTER — Ambulatory Visit (INDEPENDENT_AMBULATORY_CARE_PROVIDER_SITE_OTHER): Payer: No Typology Code available for payment source | Admitting: Family Medicine

## 2020-08-06 ENCOUNTER — Encounter (INDEPENDENT_AMBULATORY_CARE_PROVIDER_SITE_OTHER): Payer: Self-pay | Admitting: Family Medicine

## 2020-08-06 VITALS — BP 137/71 | HR 92 | Temp 98.3°F | Ht 63.0 in | Wt 200.0 lb

## 2020-08-06 DIAGNOSIS — Z8639 Personal history of other endocrine, nutritional and metabolic disease: Secondary | ICD-10-CM | POA: Diagnosis not present

## 2020-08-06 DIAGNOSIS — Z9189 Other specified personal risk factors, not elsewhere classified: Secondary | ICD-10-CM

## 2020-08-06 DIAGNOSIS — E559 Vitamin D deficiency, unspecified: Secondary | ICD-10-CM | POA: Diagnosis not present

## 2020-08-06 DIAGNOSIS — R7303 Prediabetes: Secondary | ICD-10-CM

## 2020-08-06 DIAGNOSIS — E7849 Other hyperlipidemia: Secondary | ICD-10-CM | POA: Insufficient documentation

## 2020-08-06 DIAGNOSIS — Z6836 Body mass index (BMI) 36.0-36.9, adult: Secondary | ICD-10-CM

## 2020-08-06 MED ORDER — VITAMIN D (ERGOCALCIFEROL) 1.25 MG (50000 UNIT) PO CAPS: 50000.0000 [IU] | ORAL_CAPSULE | ORAL | 0 refills | Status: DC

## 2020-08-06 NOTE — Patient Instructions (Signed)
The 10-year ASCVD risk score Mikey Bussing DC Brooke Bonito., et al., 2013) is: 6.8%   Values used to calculate the score:     Age: 60 years     Sex: Female     Is Non-Hispanic African American: Yes     Diabetic: No     Tobacco smoker: No     Systolic Blood Pressure: 856 mmHg     Is BP treated: No     HDL Cholesterol: 44 mg/dL     Total Cholesterol: 203 mg/dL

## 2020-08-17 NOTE — Progress Notes (Signed)
Chief Complaint:   OBESITY Victoria [name] is here to discuss her progress with her obesity treatment plan along with follow-up of her obesity related diagnoses.   Today's visit was #: 2 Starting weight: 205 lbs Starting date: 07/23/2020 Today's weight: 200 lbs Today's date: 08/06/2020 Total lbs lost to date: 5 lbs Body mass index is 35.43 kg/m.  Total weight loss percentage to date: -2.44%  Interim History:   Victoria [name] is here today for her first follow-up office visit since starting the program with [name].   We reviewed her NEW Meal Plan and discussed all recent labs done here and/ or done at outside facilities.  Extended time was spent counseling Victoria [name] on all new disease processes that were discovered or that are worsening.    she is following the meal plan with only minor concerns/ questions today.   Patient's meal and food recall appears to be accurate and consistent with what is on the plan when she is following it.   When on plan, her hunger and cravings are well controlled.    Evan did not skip lunch.  She measured proteins.  She says she has a habit of grabbing a little something after dinner.  Plan:  Gave handouts on physical versus emotional hunger.  Current Meal Plan: the Category 1 Plan for 95% of the time.  Current Exercise Plan: Walking for 30 minutes 3-4 times per week.  Assessment/Plan:   Meds ordered this encounter  Medications  . DISCONTD: Vitamin D, Ergocalciferol, (DRISDOL) 1.25 MG (50000 UNIT) CAPS capsule    Sig: Take 1 capsule (50,000 Units total) by mouth every 7 (seven) days. (OV for rf)    Dispense:  4 capsule    Refill:  0     1. Prediabetes Not at goal. Goal is HgbA1c < 5.7.  Medication: None.  [name] says she has always declined medications in the past.  A1c is 6.0  Plan:  Discussed labs with patient today.  She will continue to focus on protein-rich, low simple carbohydrate foods. We reviewed the importance of hydration, regular exercise  for stress reduction, and restorative sleep.  Will recheck A1c and fasting insulin in 3 months.   Lab Results  Component Value Date   HGBA1C 6.0 (H) 07/23/2020   Lab Results  Component Value Date   INSULIN 15.7 07/23/2020   2. Other hyperlipidemia Course: Not at goal. Lipid-lowering medications: None.    Plan  Discussed labs with patient today. :Dietary changes: Increase soluble fiber, decrease simple carbohydrates, decrease saturated fat. Exercise changes: Moderate to vigorous-intensity aerobic activity 150 minutes per week or as tolerated. We will continue to monitor along with PCP/specialists as it pertains to her weight loss journey. Clemencia declines medication today and has in the past.  She wants to try prudent nutritional plan and weight loss.  Recheck in 3-4 months.  Follow prudent nutritional plan and decrease saturated and trans fats.  Lab Results  Component Value Date   CHOL 203 (H) 07/23/2020   HDL 44 07/23/2020   LDLCALC 138 (H) 07/23/2020   TRIG 114 07/23/2020   CHOLHDL 4.6 (H) 07/23/2020   Lab Results  Component Value Date   ALT 17 07/23/2020   AST 16 07/23/2020   ALKPHOS 91 07/23/2020   BILITOT 0.3 07/23/2020   The 10-year ASCVD risk score [name], et al., 2013) is: 5.2%   Values used to calculate the score:     Age: 2 years  Sex: Female     Is Non-Hispanic African American: Yes     Diabetic: No     Tobacco smoker: No     Systolic Blood Pressure: 856 mmHg     Is BP treated: No     HDL Cholesterol: 44 mg/dL     Total Cholesterol: 203 mg/dL  3. History of iron deficiency Labs within normal limits.  Asymptomatic.    Plan:  Discussed labs with patient today. Continue multivitamin.  Labs within normal limits.   Nutrition: Iron-rich foods include dark leafy greens, red and white meats, eggs, seafood, and beans.  Certain foods and drinks prevent your body from absorbing iron properly. Avoid eating these foods in the same meal as iron-rich foods or with  iron supplements. These foods include: coffee, black tea, and red wine; milk, dairy products, and foods that are high in calcium; beans and soybeans; whole grains. Constipation can be a side effect of iron supplementation. Increased water and fiber intake are helpful. Water goal: > 2 liters/day. Fiber goal: > 25 grams/day.  CBC Latest Ref Rng & Units 07/23/2020 06/08/2020 08/30/2018  WBC 3.4 - 10.8 x10E3/uL 6.6 7.1 11.0(H)  Hemoglobin 11.1 - 15.9 g/dL 14.6 13.4 13.5  Hematocrit 34.0 - 46.6 % 44.9 39.0 41.6  Platelets 150 - 450 x10E3/uL 292 287.0 304   Lab Results  Component Value Date   IRON 67 05/31/2010   Lab Results  Component Value Date   VITAMINB12 556 07/23/2020   4. Vitamin D deficiency Not at goal. Current vitamin D is 36.9, tested on 07/23/2020. Optimal goal > 50 ng/dL.   Plan: New.  Discussed labs with patient today.  Start to take prescription Vitamin D @50 ,000 IU every week as prescribed.  Will recheck vitamin D level in 3 months or so.  - Start Vitamin D, Ergocalciferol, (DRISDOL) 1.25 MG (50000 UNIT) CAPS capsule; Take 1 capsule (50,000 Units total) by mouth every 7 (seven) days. (OV for rf)  Dispense: 4 capsule; Refill: 0  5. At risk for osteoporosis [name] was given approximately 23 minutes of osteoporosis prevention counseling today.   [name] is at risk for osteopenia and osteoporosis due to Vitamin D deficiency, as well as other risk factors.  We discussed the importance of prudent screenings through her PCP's office for prevention.     [name] was encouraged to take her Vitamin D and follow her calcium rich diet.  We will continue to monitor vitamin D levels to ensure treatment is appropriate.   It is recommended that she eventually engage in weight bearing exercises and muscle strengthening exercises to help improve bone density and decrease her risk of osteopenia and osteoporosis.  6. Obesity current BMI 35.4  Course: [name] is currently in the action stage of change. As such,  her goal is to continue with weight loss efforts.   Nutrition goals: She has agreed to the Category 1 Plan with protein equivalents..   Exercise goals: As is.  Behavioral modification strategies: increasing lean protein intake, decreasing simple carbohydrates, increasing water intake, keeping healthy foods in the home and planning for success.  Renate has agreed to follow-up with our clinic in 2 weeks. She was informed of the importance of frequent follow-up visits to maximize her success with intensive lifestyle modifications for her multiple health conditions.   Objective:   Blood pressure 137/71, pulse 92, temperature 98.3 F (36.8 C), height 5\' 3"  (1.6 m), weight 200 lb (90.7 kg), SpO2 98 %. Body mass index is 35.43 kg/m.  General: Cooperative, alert, well developed, in no acute distress. HEENT: Conjunctivae and lids unremarkable. Cardiovascular: Regular rhythm.  Lungs: Normal work of breathing. Neurologic: No focal deficits.   Lab Results  Component Value Date   CREATININE 0.68 07/23/2020   BUN 8 07/23/2020   NA 141 07/23/2020   K 4.4 07/23/2020   CL 102 07/23/2020   CO2 22 07/23/2020   Lab Results  Component Value Date   ALT 17 07/23/2020   AST 16 07/23/2020   ALKPHOS 91 07/23/2020   BILITOT 0.3 07/23/2020   Lab Results  Component Value Date   HGBA1C 6.0 (H) 07/23/2020   HGBA1C 6.0 06/08/2020   HGBA1C 6.0 06/05/2018   HGBA1C 6.0 02/20/2017   HGBA1C 5.9 05/24/2016   Lab Results  Component Value Date   INSULIN 15.7 07/23/2020   Lab Results  Component Value Date   TSH 1.620 07/23/2020   Lab Results  Component Value Date   CHOL 203 (H) 07/23/2020   HDL 44 07/23/2020   LDLCALC 138 (H) 07/23/2020   TRIG 114 07/23/2020   CHOLHDL 4.6 (H) 07/23/2020   Lab Results  Component Value Date   WBC 6.6 07/23/2020   HGB 14.6 07/23/2020   HCT 44.9 07/23/2020   MCV 83 07/23/2020   PLT 292 07/23/2020   Lab Results  Component Value Date   IRON 67 05/31/2010    Attestation Statements:   Reviewed by clinician on day of visit: allergies, medications, problem list, medical history, surgical history, family history, social history, and previous encounter notes.  I, Water quality scientist, CMA, am acting as Location manager for [employer], DO.  I have reviewed the above documentation for accuracy and completeness, and I agree with the above. Marjory Sneddon, D.O.  The [place] was signed into law in 2016 which includes the topic of electronic health records.  This provides immediate access to information in MyChart.  This includes consultation notes, operative notes, office notes, lab results and pathology reports.  If you have any questions about what you read please let us know at your next visit so we can discuss your concerns and take corrective action if need be.  We are right here with you.

## 2020-08-20 ENCOUNTER — Encounter (INDEPENDENT_AMBULATORY_CARE_PROVIDER_SITE_OTHER): Payer: Self-pay | Admitting: Family Medicine

## 2020-08-20 ENCOUNTER — Ambulatory Visit (INDEPENDENT_AMBULATORY_CARE_PROVIDER_SITE_OTHER): Payer: No Typology Code available for payment source | Admitting: Family Medicine

## 2020-08-20 ENCOUNTER — Other Ambulatory Visit: Payer: Self-pay

## 2020-08-20 VITALS — BP 125/69 | HR 82 | Temp 98.1°F | Ht 63.0 in | Wt 198.0 lb

## 2020-08-20 DIAGNOSIS — E559 Vitamin D deficiency, unspecified: Secondary | ICD-10-CM | POA: Diagnosis not present

## 2020-08-20 DIAGNOSIS — Z9189 Other specified personal risk factors, not elsewhere classified: Secondary | ICD-10-CM

## 2020-08-20 DIAGNOSIS — Z6836 Body mass index (BMI) 36.0-36.9, adult: Secondary | ICD-10-CM

## 2020-08-20 DIAGNOSIS — R7303 Prediabetes: Secondary | ICD-10-CM | POA: Diagnosis not present

## 2020-08-20 MED ORDER — VITAMIN D (ERGOCALCIFEROL) 1.25 MG (50000 UNIT) PO CAPS: 50000.0000 [IU] | ORAL_CAPSULE | ORAL | 0 refills | Status: DC

## 2020-08-25 NOTE — Progress Notes (Signed)
Chief Complaint:   OBESITY Victoria [name] is here to discuss her progress with her obesity treatment plan along with follow-up of her obesity related diagnoses.   Today's visit was #: 3 Starting weight: 205 lbs Starting date: 07/23/2020 Today's weight: 198 lbs Today's date: 08/20/2020 Total lbs lost to date: 7 lbs Body mass index is 35.07 kg/m.  Total weight loss percentage to date: -3.41%  Interim History:  Victoria [name] is here for a follow up office visit and she is following the meal plan without concerns or issues.  Patient's meal and food recall appears to be accurate and consistent with what is on the plan.  When on plan, her hunger and cravings are well controlled.    Current Meal Plan: the Category 1 Plan with protein equivalents.  Current Exercise Plan: Walking for 20-30 minutes 2 times per week.  Assessment/Plan:   No orders of the defined types were placed in this encounter.   Medications Discontinued During This Encounter  Medication Reason  . Vitamin D, Ergocalciferol, (DRISDOL) 1.25 MG (50000 UNIT) CAPS capsule Reorder     Meds ordered this encounter  Medications  . Vitamin D, Ergocalciferol, (DRISDOL) 1.25 MG (50000 UNIT) CAPS capsule    Sig: Take 1 capsule (50,000 Units total) by mouth every 7 (seven) days. (OV for rf)    Dispense:  4 capsule    Refill:  0     1. Prediabetes Not at goal. Goal is HgbA1c < 5.7.  Medication: None.    Plan:  She will continue to focus on protein-rich, low simple carbohydrate foods. We reviewed the importance of hydration, regular exercise for stress reduction, and restorative sleep.  She declines medication today after discussion of her after dinner cravings/hunger.  Will continue to monitor.  Strategies discussed with her.  Lab Results  Component Value Date   HGBA1C 6.0 (H) 07/23/2020   Lab Results  Component Value Date   INSULIN 15.7 07/23/2020   2. Vitamin D deficiency Not at goal. Current vitamin D is 36.9, tested on  07/23/2020. Optimal goal > 50 ng/dL.  She is taking vitamin D 50,000 IU weekly.  Started vitamin D at last office visit.  Tolerating well without side effects.  Plan: Continue to take prescription Vitamin D @50 ,000 IU every week as prescribed.  Follow-up for routine testing of Vitamin D, at least 2-3 times per year to avoid over-replacement.  - Refill Vitamin D, Ergocalciferol, (DRISDOL) 1.25 MG (50000 UNIT) CAPS capsule; Take 1 capsule (50,000 Units total) by mouth every 7 (seven) days. (OV for rf)  Dispense: 4 capsule; Refill: 0  3. At risk for osteoporosis Celese was given approximately 9 minutes of osteoporosis prevention counseling today.   Brisia is at risk for osteopenia and osteoporosis due to Vitamin D deficiency, as well as other risk factors.  We discussed the importance of prudent screenings through her PCP's office for prevention.     Tambi was encouraged to take her Vitamin D and follow her calcium rich diet.  We will continue to monitor vitamin D levels to ensure treatment is appropriate.   It is recommended that she eventually engage in weight bearing exercises and muscle strengthening exercises to help improve bone density and decrease her risk of osteopenia and osteoporosis.  4. Obesity with current BMI of 35.2  Course: [name] is currently in the action stage of change. As such, her goal is to continue with weight loss efforts.   Nutrition goals: She has agreed to  the Category 1 Plan with protein options.   Exercise goals: As is.  Behavioral modification strategies: increasing lean protein intake, decreasing simple carbohydrates, increasing high fiber foods, meal planning and cooking strategies, better snacking choices, avoiding temptations and planning for success.  Aminat has agreed to follow-up with our clinic in 3 weeks with Abby Potash, PA-C, then in 2 weeks with me. She was informed of the importance of frequent follow-up visits to maximize her success with intensive  lifestyle modifications for her multiple health conditions.   Objective:   Blood pressure 125/69, pulse 82, temperature 98.1 F (36.7 C), height 5\' 3"  (1.6 m), weight 198 lb (89.8 kg), SpO2 99 %. Body mass index is 35.07 kg/m.  General: Cooperative, alert, well developed, in no acute distress. HEENT: Conjunctivae and lids unremarkable. Cardiovascular: Regular rhythm.  Lungs: Normal work of breathing. Neurologic: No focal deficits.   Lab Results  Component Value Date   CREATININE 0.68 07/23/2020   BUN 8 07/23/2020   NA 141 07/23/2020   K 4.4 07/23/2020   CL 102 07/23/2020   CO2 22 07/23/2020   Lab Results  Component Value Date   ALT 17 07/23/2020   AST 16 07/23/2020   ALKPHOS 91 07/23/2020   BILITOT 0.3 07/23/2020   Lab Results  Component Value Date   HGBA1C 6.0 (H) 07/23/2020   HGBA1C 6.0 06/08/2020   HGBA1C 6.0 06/05/2018   HGBA1C 6.0 02/20/2017   HGBA1C 5.9 05/24/2016   Lab Results  Component Value Date   INSULIN 15.7 07/23/2020   Lab Results  Component Value Date   TSH 1.620 07/23/2020   Lab Results  Component Value Date   CHOL 203 (H) 07/23/2020   HDL 44 07/23/2020   LDLCALC 138 (H) 07/23/2020   TRIG 114 07/23/2020   CHOLHDL 4.6 (H) 07/23/2020   Lab Results  Component Value Date   WBC 6.6 07/23/2020   HGB 14.6 07/23/2020   HCT 44.9 07/23/2020   MCV 83 07/23/2020   PLT 292 07/23/2020   Lab Results  Component Value Date   IRON 67 05/31/2010   Attestation Statements:   Reviewed by clinician on day of visit: allergies, medications, problem list, medical history, surgical history, family history, social history, and previous encounter notes.  I, Water quality scientist, CMA, am acting as Location manager for [employer], DO.  I have reviewed the above documentation for accuracy and completeness, and I agree with the above. Marjory Sneddon, D.O.  The [place] was signed into law in 2016 which includes the topic of electronic health  records.  This provides immediate access to information in MyChart.  This includes consultation notes, operative notes, office notes, lab results and pathology reports.  If you have any questions about what you read please let us know at your next visit so we can discuss your concerns and take corrective action if need be.  We are right here with you.

## 2020-08-27 ENCOUNTER — Ambulatory Visit
Admission: RE | Admit: 2020-08-27 | Discharge: 2020-08-27 | Disposition: A | Discharge status: Home / Self Care | Payer: No Typology Code available for payment source | Source: Ambulatory Visit | Attending: Family Medicine | Admitting: Family Medicine

## 2020-08-27 ENCOUNTER — Other Ambulatory Visit: Payer: Self-pay

## 2020-08-27 DIAGNOSIS — Z1231 Encounter for screening mammogram for malignant neoplasm of breast: Secondary | ICD-10-CM

## 2020-09-08 ENCOUNTER — Ambulatory Visit (INDEPENDENT_AMBULATORY_CARE_PROVIDER_SITE_OTHER): Payer: No Typology Code available for payment source | Admitting: Physician Assistant

## 2020-09-22 ENCOUNTER — Ambulatory Visit (INDEPENDENT_AMBULATORY_CARE_PROVIDER_SITE_OTHER): Payer: No Typology Code available for payment source | Admitting: Family Medicine

## 2020-10-14 ENCOUNTER — Other Ambulatory Visit (INDEPENDENT_AMBULATORY_CARE_PROVIDER_SITE_OTHER): Payer: Self-pay | Admitting: Family Medicine

## 2020-10-14 DIAGNOSIS — E559 Vitamin D deficiency, unspecified: Secondary | ICD-10-CM

## 2020-10-14 NOTE — Telephone Encounter (Signed)
Last seen Dr Raliegh Scarlet

## 2021-01-07 ENCOUNTER — Other Ambulatory Visit: Payer: Self-pay

## 2021-01-07 ENCOUNTER — Ambulatory Visit: Payer: No Typology Code available for payment source | Admitting: Family Medicine

## 2021-01-07 VITALS — BP 120/70 | HR 75 | Temp 97.0°F | Wt 207.0 lb

## 2021-01-07 DIAGNOSIS — H6983 Other specified disorders of Eustachian tube, bilateral: Secondary | ICD-10-CM | POA: Diagnosis not present

## 2021-01-07 DIAGNOSIS — H6993 Unspecified Eustachian tube disorder, bilateral: Secondary | ICD-10-CM | POA: Insufficient documentation

## 2021-01-07 NOTE — Progress Notes (Signed)
   Subjective:     Victoria [name] is a 60 y.o. female presenting for Ear Pain (L x months. Had pain R ear as well but that is gone today)     HPI  #ear pain - primarily in the right ear - but now in the left ear - which started today - right ear not painful today - having congestion - hearing an echo - no ringing in the ear - no hearing loss - sinus pill at night and ibuprofen/tylenol - no allergy hx   Review of Systems  Constitutional:  Negative for chills and fever.  HENT:  Positive for congestion. Negative for rhinorrhea.   Respiratory:  Negative for shortness of breath.     Social History   Tobacco Use  Smoking Status Never  Smokeless Tobacco Never        Objective:    BP Readings from Last 3 Encounters:  01/07/21 120/70  08/20/20 125/69  08/06/20 137/71   Wt Readings from Last 3 Encounters:  01/07/21 207 lb (93.9 kg)  08/20/20 198 lb (89.8 kg)  08/06/20 200 lb (90.7 kg)    BP 120/70   Pulse 75   Temp (!) 97 F (36.1 C) (Temporal)   Wt 207 lb (93.9 kg)   SpO2 98%   BMI 36.67 kg/m    Physical Exam Constitutional:      General: She is not in acute distress.    Appearance: She is well-developed. She is not diaphoretic.  HENT:     Right Ear: Tympanic membrane and external ear normal.     Left Ear: External ear normal. A middle ear effusion is present. Tympanic membrane is not injected, erythematous, retracted or bulging.     Nose: Nose normal.  Eyes:     Conjunctiva/sclera: Conjunctivae normal.  Cardiovascular:     Rate and Rhythm: Normal rate and regular rhythm.     Heart sounds: No murmur heard. Pulmonary:     Effort: Pulmonary effort is normal.     Breath sounds: Normal breath sounds. No wheezing.  Musculoskeletal:     Cervical back: Neck supple.  Skin:    General: Skin is warm and dry.     Capillary Refill: Capillary refill takes less than 2 seconds.  Neurological:     Mental Status: She is alert. Mental status is at baseline.   Psychiatric:        Mood and Affect: Mood normal.        Behavior: Behavior normal.          Assessment & Plan:   Problem List Items Addressed This Visit       Nervous and Auditory   Eustachian tube dysfunction, bilateral - Primary    Based on hx and fluid in the left ear seems the most likely cause. Advised allergy/sinus treatment - flonase, zyrtec. If no improvement or worsening ENT referral given duration of symptoms.         Return if symptoms worsen or fail to improve.  Lesleigh Noe, MD  This visit occurred during the SARS-CoV-2 public health emergency.  Safety protocols were in place, including screening questions prior to the visit, additional usage of staff PPE, and extensive cleaning of exam room while observing appropriate contact time as indicated for disinfecting solutions.

## 2021-01-07 NOTE — Assessment & Plan Note (Signed)
Based on hx and fluid in the left ear seems the most likely cause. Advised allergy/sinus treatment - flonase, zyrtec. If no improvement or worsening ENT referral given duration of symptoms.

## 2021-01-07 NOTE — Patient Instructions (Signed)
Try daily allergy - claritin, zyrtec, allegra - try flonase nasal spray - continue sinus pill and pain reliever as needed - follow instructions below  Eustachian Tube Dysfunction  Eustachian tube dysfunction refers to a condition in which a blockage develops in the narrow passage that connects the middle ear to the back of the nose (eustachian tube). The eustachian tube regulates air pressure in the middle ear by letting air move between the ear and nose. It also helps to drain fluid from the middle earspace. Eustachian tube dysfunction can affect one or both ears. When the eustachian tube does not function properly, air pressure, fluid, or both can build up inthe middle ear. What are the causes? This condition occurs when the eustachian tube becomes blocked or cannot open normally. Common causes of this condition include: Ear infections. Colds and other infections that affect the nose, mouth, and throat (upper respiratory tract). Allergies. Irritation from cigarette smoke. Irritation from stomach acid coming up into the esophagus (gastroesophageal reflux). The esophagus is the tube that carries food from the mouth to the stomach. Sudden changes in air pressure, such as from descending in an airplane or scuba diving. Abnormal growths in the nose or throat, such as: Growths that line the nose (nasal polyps). Abnormal growth of cells (tumors). Enlarged tissue at the back of the throat (adenoids). What increases the risk? You are more likely to develop this condition if: You smoke. You are overweight. You are a child who has: Certain birth defects of the mouth, such as cleft palate. Large tonsils or adenoids. What are the signs or symptoms? Common symptoms of this condition include: A feeling of fullness in the ear. Ear pain. Clicking or popping noises in the ear. Ringing in the ear. Hearing loss. Loss of balance. Dizziness. Symptoms may get worse when the air pressure around you  changes, such as whenyou travel to an area of high elevation, fly on an airplane, or go scuba diving. How is this diagnosed? This condition may be diagnosed based on: Your symptoms. A physical exam of your ears, nose, and throat. Tests, such as those that measure: The movement of your eardrum (tympanogram). Your hearing (audiometry). How is this treated? Treatment depends on the cause and severity of your condition. In mild cases, you may relieve your symptoms by moving air into your ears. This is called "popping the ears." In more severe cases, or if you have symptoms of fluid in your ears, treatment may include: Medicines to relieve congestion (decongestants). Medicines that treat allergies (antihistamines). Nasal sprays or ear drops that contain medicines that reduce swelling (steroids). A procedure to drain the fluid in your eardrum (myringotomy). In this procedure, a small tube is placed in the eardrum to: Drain the fluid. Restore the air in the middle ear space. A procedure to insert a balloon device through the nose to inflate the opening of the eustachian tube (balloon dilation). Follow these instructions at home: Lifestyle Do not do any of the following until your health care provider approves: Travel to high altitudes. Fly in airplanes. Work in a Pension scheme manager or room. Scuba dive. Do not use any products that contain nicotine or tobacco, such as cigarettes and e-cigarettes. If you need help quitting, ask your health care provider. Keep your ears dry. Wear fitted earplugs during showering and bathing. Dry your ears completely after. General instructions Take over-the-counter and prescription medicines only as told by your health care provider. Use techniques to help pop your ears as recommended by  your health care provider. These may include: Chewing gum. Yawning. Frequent, forceful swallowing. Closing your mouth, holding your nose closed, and gently blowing as if you  are trying to blow air out of your nose. Keep all follow-up visits as told by your health care provider. This is important. Contact a health care provider if: Your symptoms do not go away after treatment. Your symptoms come back after treatment. You are unable to pop your ears. You have: A fever. Pain in your ear. Pain in your head or neck. Fluid draining from your ear. Your hearing suddenly changes. You become very dizzy. You lose your balance. Summary Eustachian tube dysfunction refers to a condition in which a blockage develops in the eustachian tube. It can be caused by ear infections, allergies, inhaled irritants, or abnormal growths in the nose or throat. Symptoms include ear pain, hearing loss, or ringing in the ears. Mild cases are treated with maneuvers to unblock the ears, such as yawning or ear popping. Severe cases are treated with medicines. Surgery may also be done (rare). This information is not intended to replace advice given to you by your health care provider. Make sure you discuss any questions you have with your healthcare provider. Document Revised: 08/22/2017 Document Reviewed: 08/22/2017 Elsevier Patient Education  [place].

## 2021-02-22 ENCOUNTER — Ambulatory Visit (INDEPENDENT_AMBULATORY_CARE_PROVIDER_SITE_OTHER): Payer: No Typology Code available for payment source | Admitting: Podiatry

## 2021-02-22 ENCOUNTER — Other Ambulatory Visit: Payer: Self-pay

## 2021-02-22 ENCOUNTER — Encounter: Payer: Self-pay | Admitting: Podiatry

## 2021-02-22 ENCOUNTER — Ambulatory Visit (INDEPENDENT_AMBULATORY_CARE_PROVIDER_SITE_OTHER): Payer: No Typology Code available for payment source

## 2021-02-22 DIAGNOSIS — M7751 Other enthesopathy of right foot: Secondary | ICD-10-CM | POA: Diagnosis not present

## 2021-02-22 DIAGNOSIS — M21612 Bunion of left foot: Secondary | ICD-10-CM

## 2021-02-22 DIAGNOSIS — M21611 Bunion of right foot: Secondary | ICD-10-CM

## 2021-02-22 DIAGNOSIS — M21619 Bunion of unspecified foot: Secondary | ICD-10-CM

## 2021-02-22 MED ORDER — TRIAMCINOLONE ACETONIDE 10 MG/ML IJ SUSP
10.0000 mg | Freq: Once | INTRAMUSCULAR | Status: AC
  Administered 2021-02-22: 10 mg

## 2021-02-22 NOTE — Progress Notes (Signed)
Subjective:   Patient ID: Victoria [name], female   DOB: 60 y.o.   MRN: [id number]   HPI Patient presents with a lot of pain in the right big toe joint stating that she traumatized this around a month ago.  States its been very sore and she knows she has bunion and also her left can be tender underneath the big toe joint and inflamed.  Patient does not smoke has not been seen for a number of years   Review of Systems  All other systems reviewed and are negative.      Objective:  Physical Exam Vitals and nursing note reviewed.  Constitutional:      Appearance: She is well-developed.  Pulmonary:     Effort: Pulmonary effort is normal.  Musculoskeletal:        General: Normal range of motion.  Skin:    General: Skin is warm.  Neurological:     Mental Status: She is alert.    Neurovascular status found to be intact muscle strength was found to be adequate range of motion adequate.  Patient is noted to have a inflamed first MPJ right with redness and swelling around the joint and keratotic lesions of first metatarsal left that can be painful when pressed.  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  Discussed the possibility for surgical intervention and for the left we do not recommend any treatment  X-rays indicate structural bunion deformity bilateral right over left with elevation of the intermetatarsal angle no indications of bone trauma from injury right

## 2021-03-22 ENCOUNTER — Ambulatory Visit: Payer: No Typology Code available for payment source | Admitting: Podiatry

## 2021-05-06 ENCOUNTER — Other Ambulatory Visit (HOSPITAL_BASED_OUTPATIENT_CLINIC_OR_DEPARTMENT_OTHER): Payer: Self-pay

## 2021-05-06 ENCOUNTER — Ambulatory Visit (HOSPITAL_BASED_OUTPATIENT_CLINIC_OR_DEPARTMENT_OTHER)
Admission: RE | Admit: 2021-05-06 | Discharge: 2021-05-06 | Disposition: A | Discharge status: Home / Self Care | Payer: No Typology Code available for payment source | Source: Ambulatory Visit | Attending: Orthopaedic Surgery | Admitting: Orthopaedic Surgery

## 2021-05-06 ENCOUNTER — Other Ambulatory Visit: Payer: Self-pay

## 2021-05-06 ENCOUNTER — Other Ambulatory Visit (HOSPITAL_BASED_OUTPATIENT_CLINIC_OR_DEPARTMENT_OTHER): Payer: Self-pay | Admitting: Orthopaedic Surgery

## 2021-05-06 ENCOUNTER — Ambulatory Visit (INDEPENDENT_AMBULATORY_CARE_PROVIDER_SITE_OTHER): Payer: No Typology Code available for payment source | Admitting: Orthopaedic Surgery

## 2021-05-06 DIAGNOSIS — M25551 Pain in right hip: Secondary | ICD-10-CM

## 2021-05-06 DIAGNOSIS — M5416 Radiculopathy, lumbar region: Secondary | ICD-10-CM | POA: Diagnosis not present

## 2021-05-06 MED ORDER — METHYLPREDNISOLONE 4 MG PO TBPK
ORAL_TABLET | ORAL | 0 refills | Status: DC
  Filled 2021-05-06: qty 21, 6d supply, fill #0

## 2021-05-06 NOTE — Progress Notes (Signed)
Chief Complaint: Right back pain radiating down leg     History of Present Illness:    Victoria [name] is a 60 y.o. female presents with right buttocks pain radiating down the leg down the posterior thigh and calf.  She states that this is been going on for 2 weeks.  She does have a history of being told that she had a slipped disc which resolved with going to the chiropractor.  She is taking ibuprofen which helps.  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  She was given Flexeril by the emergency room but she is not taking this.  She states that the pain is little bit worse since this occurred 2 weeks ago.  This is positional and she is having a hard time laying directly on the side    Surgical History:   None  PMH/PSH/Family History/Social History/Meds/Allergies:    Past Medical History:  Diagnosis Date   Allergic rhinitis    Anemia    Anemia, iron deficiency    Back pain    Gallbladder problem    Heavy menses    Hyperglycemia 02/22/2016   Lipoma    on L arm   SOBOE (shortness of breath on exertion)    Uterine fibroid    Past Surgical History:  Procedure Laterality Date   CESAREAN SECTION     CHOLECYSTECTOMY     11/07/11   ENDOMETRIAL ABLATION     fibroids   Pelvic U/S and transvaginal  04/27/2009   fibroids x 2   TONSILLECTOMY     TUBAL LIGATION     Uterine ultrasound  06/1998   and endo biopsy- neg   Social History   Socioeconomic History   Marital status: Married    Spouse name: Not on file   Number of children: 3   Years of education: Not on file   Highest education level: Not on file  Occupational History   Occupation: Cosmotologist    Employer: SELF  Tobacco Use   Smoking status: Never   Smokeless tobacco: Never  Substance and Sexual Activity   Alcohol use: No    Alcohol/week: 0.0 standard drinks   Drug use: No   Sexual activity: Yes    Partners: Male    Birth control/protection: None  Other  Topics Concern   Not on file  Social History Narrative   Not on file   Social Determinants of Health   Financial Resource Strain: Not on file  Food Insecurity: Not on file  Transportation Needs: Not on file  Physical Activity: Not on file  Stress: Not on file  Social Connections: Not on file   Family History  Problem Relation Age of Onset   Diabetes Mother    Hypertension Mother    Hyperlipidemia Mother    Cancer Mother        uterine cancer   Lung cancer Mother        died 80   Diabetes Father    Alcohol abuse Father    Hypertension Father    Heart disease Father    Sudden death Father    Alcoholism Father    Alcohol abuse Sister    Hypertension Sister    Diabetes Sister        boarderline   Alcohol  abuse Brother    Hypertension Brother    Diabetes Maternal Grandmother    Alcohol abuse Paternal Grandfather    Alcohol abuse Brother    Alcohol abuse Brother    Alcohol abuse Brother    Diabetes Sister        severe   Colon cancer Neg Hx    Breast cancer Neg Hx    Allergies  Allergen Reactions   Codeine     REACTION: nausea and vomiting   Doxycycline Nausea Only   Current Outpatient Medications  Medication Sig Dispense Refill   IBUPROFEN PO Take by mouth. Prn     Multiple Vitamin (MULTIVITAMIN) tablet Take 1 tablet by mouth daily.     No current facility-administered medications for this visit.   No results found.  Review of Systems:   A ROS was performed including pertinent positives and negatives as documented in the HPI.  Physical Exam :   Constitutional: NAD and appears stated age Neurological: Alert and oriented Psych: Appropriate affect and cooperative There were no vitals taken for this visit.   Comprehensive Musculoskeletal Exam:    Inspection Right Left  Skin No atrophy or gross abnormalities appreciated No atrophy or gross abnormalities appreciated  Palpation    Tenderness None None  Crepitus None None  Range of Motion    Flexion  (passive) 120 120  Extension 30 30  IR 30 30  ER 45 45  Strength    Flexion  5/5 5/5  Extension 5/5 5/5  Special Tests    FABIR Negative Negative  FADER Negative Negative  ER Lag/Capsular Insufficiency Negative Negative  Instability Negative Negative  Sacroiliac pain Negative  Negative   Instability    Generalized Laxity No No  Neurologic    sciatic, femoral, obturator nerves intact to light sensation  Vascular/Lymphatic    DP pulse 2+ 2+  Lumbar Exam    Patient has symmetric lumbar range of motion with negative pain referral to hip   Positive straight leg raise  Imaging:   Xray (3 views AP pelvis right hip): 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   Assessment:   79-year-old female with right lower back pain and buttocks pain consistent with lumbar radiculitis.  I described that ultimately the vast majority of of the time this resolved spontaneously without additional treatment.  I have prescribed her a Medrol Dosepak in order to get her through this acute period.  We did also discuss the role of a chiropractic adjustment as this was helpful for her in the past.  With regard to future prevention I specifically advised her on a core strengthening program and showed her specific YouTube videos that she can use to hopefully prevent events like this in the future  Plan :    -She will follow-up as needed     I personally saw and evaluated the patient, and participated in the management and treatment plan.  [name], MD Attending Physician, Orthopedic Surgery  This document was dictated using Dragon voice recognition software. A reasonable attempt at proof reading has been made to minimize errors.

## 2021-05-11 ENCOUNTER — Telehealth: Payer: Self-pay | Admitting: Orthopaedic Surgery

## 2021-05-11 NOTE — Telephone Encounter (Signed)
Patient called. Says she is not any better. She would like a call back from [place]. Her call back number is [phone]

## 2021-05-12 ENCOUNTER — Other Ambulatory Visit (HOSPITAL_BASED_OUTPATIENT_CLINIC_OR_DEPARTMENT_OTHER): Payer: Self-pay | Admitting: Orthopaedic Surgery

## 2021-05-12 ENCOUNTER — Telehealth: Payer: Self-pay | Admitting: Orthopaedic Surgery

## 2021-05-12 MED ORDER — MELOXICAM 15 MG PO TABS: 15.0000 mg | ORAL_TABLET | Freq: Every day | ORAL | 2 refills | Status: DC

## 2021-05-12 NOTE — Telephone Encounter (Signed)
Pt called updates for a script for pain medication. Please call pt at [phone] when script has been sent to pharmacy on file.

## 2021-05-13 NOTE — Telephone Encounter (Signed)
LMOM for patient informing pt Rx was sent to [facility] on [address] in [place]

## 2021-05-18 ENCOUNTER — Other Ambulatory Visit: Payer: Self-pay

## 2021-05-18 ENCOUNTER — Ambulatory Visit (INDEPENDENT_AMBULATORY_CARE_PROVIDER_SITE_OTHER): Payer: No Typology Code available for payment source | Admitting: Orthopaedic Surgery

## 2021-05-18 DIAGNOSIS — M5416 Radiculopathy, lumbar region: Secondary | ICD-10-CM

## 2021-05-18 NOTE — Progress Notes (Signed)
Chief Complaint: Right back pain radiating down leg     History of Present Illness:   05/18/2021: Presents today with continued pain down the right leg.  This is worse since she presented initially.  She has been working on her core strengthening exercises as well as taking Mobic.  None of this seems to help.  The Medrol Dosepak did not seem to help.   Victoria [name] is a 61 y.o. female presents with right buttocks pain radiating down the leg down the posterior thigh and calf.  She states that this is been going on for 2 weeks.  She does have a history of being told that she had a slipped disc which resolved with going to the chiropractor.  She is taking ibuprofen which helps.  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  She was given Flexeril by the emergency room but she is not taking this.  She states that the pain is little bit worse since this occurred 2 weeks ago.  This is positional and she is having a hard time laying directly on the side    Surgical History:   None  PMH/PSH/Family History/Social History/Meds/Allergies:    Past Medical History:  Diagnosis Date   Allergic rhinitis    Anemia    Anemia, iron deficiency    Back pain    Gallbladder problem    Heavy menses    Hyperglycemia 02/22/2016   Lipoma    on L arm   SOBOE (shortness of breath on exertion)    Uterine fibroid    Past Surgical History:  Procedure Laterality Date   CESAREAN SECTION     CHOLECYSTECTOMY     11/07/11   ENDOMETRIAL ABLATION     fibroids   Pelvic U/S and transvaginal  04/27/2009   fibroids x 2   TONSILLECTOMY     TUBAL LIGATION     Uterine ultrasound  06/1998   and endo biopsy- neg   Social History   Socioeconomic History   Marital status: Married    Spouse name: Not on file   Number of children: 3   Years of education: Not on file   Highest education level: Not on file  Occupational History   Occupation: Cosmotologist     Employer: SELF  Tobacco Use   Smoking status: Never   Smokeless tobacco: Never  Substance and Sexual Activity   Alcohol use: No    Alcohol/week: 0.0 standard drinks   Drug use: No   Sexual activity: Yes    Partners: Male    Birth control/protection: None  Other Topics Concern   Not on file  Social History Narrative   Not on file   Social Determinants of Health   Financial Resource Strain: Not on file  Food Insecurity: Not on file  Transportation Needs: Not on file  Physical Activity: Not on file  Stress: Not on file  Social Connections: Not on file   Family History  Problem Relation Age of Onset   Diabetes Mother    Hypertension Mother    Hyperlipidemia Mother    Cancer Mother        uterine cancer   Lung cancer Mother        died 25   Diabetes Father    Alcohol abuse  Father    Hypertension Father    Heart disease Father    Sudden death Father    Alcoholism Father    Alcohol abuse Sister    Hypertension Sister    Diabetes Sister        boarderline   Alcohol abuse Brother    Hypertension Brother    Diabetes Maternal Grandmother    Alcohol abuse Paternal Grandfather    Alcohol abuse Brother    Alcohol abuse Brother    Alcohol abuse Brother    Diabetes Sister        severe   Colon cancer Neg Hx    Breast cancer Neg Hx    Allergies  Allergen Reactions   Codeine     REACTION: nausea and vomiting   Doxycycline Nausea Only   Current Outpatient Medications  Medication Sig Dispense Refill   IBUPROFEN PO Take by mouth. Prn     meloxicam (MOBIC) 15 MG tablet Take 1 tablet (15 mg total) by mouth daily. 30 tablet 2   methylPREDNISolone (MEDROL DOSEPAK) 4 MG TBPK tablet Take per packet instructions 21 tablet 0   Multiple Vitamin (MULTIVITAMIN) tablet Take 1 tablet by mouth daily.     No current facility-administered medications for this visit.   No results found.  Review of Systems:   A ROS was performed including pertinent positives and negatives as  documented in the HPI.  Physical Exam :   Constitutional: NAD and appears stated age Neurological: Alert and oriented Psych: Appropriate affect and cooperative There were no vitals taken for this visit.   Comprehensive Musculoskeletal Exam:    Inspection Right Left  Skin No atrophy or gross abnormalities appreciated No atrophy or gross abnormalities appreciated  Palpation    Tenderness None None  Crepitus None None  Range of Motion    Flexion (passive) 120 120  Extension 30 30  IR 30 30  ER 45 45  Strength    Flexion  5/5 5/5  Extension 5/5 5/5  Special Tests    FABIR Negative Negative  FADER Negative Negative  ER Lag/Capsular Insufficiency Negative Negative  Instability Negative Negative  Sacroiliac pain Negative  Negative   Instability    Generalized Laxity No No  Neurologic    sciatic, femoral, obturator nerves intact to light sensation  Vascular/Lymphatic    DP pulse 2+ 2+  Lumbar Exam    Patient has symmetric lumbar range of motion with negative pain referral to hip   Positive straight leg raise  Imaging:   Xray (3 views AP pelvis right hip): 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   Assessment:   61 year old female with right lower back pain and buttocks pain consistent with lumbar radiculitis.  At this time she has failed conservative management including a home exercise core program as well as medication management including antispasmodics, Medrol Dosepak, Mobic.  At this time I would like to obtain an MRI to assess for any type of disc herniation.  I discussed that the next move would be to refer her for an epidural steroid injection  Plan :    -Return to clinic following MRI     I personally saw and evaluated the patient, and participated in the management and treatment plan.  [name], MD Attending Physician, Orthopedic Surgery  This document was dictated using Dragon  voice recognition software. A reasonable attempt at proof reading has been made to minimize errors.

## 2021-06-09 ENCOUNTER — Telehealth: Payer: Self-pay | Admitting: Orthopaedic Surgery

## 2021-06-09 ENCOUNTER — Other Ambulatory Visit (HOSPITAL_BASED_OUTPATIENT_CLINIC_OR_DEPARTMENT_OTHER): Payer: Self-pay | Admitting: Orthopaedic Surgery

## 2021-06-09 MED ORDER — LORAZEPAM 1 MG PO TABS: 1.0000 mg | ORAL_TABLET | Freq: Three times a day (TID) | ORAL | 0 refills | Status: DC

## 2021-06-09 NOTE — Telephone Encounter (Signed)
Pt called stating she told Dr. Sammuel Hines she will need medication to keep her calm for her upcoming MRI on 06/13/21. Please send medication to pharmacy on file and call pt when medication has been called in please. Pt phone number is [phone].

## 2021-06-09 NOTE — Telephone Encounter (Signed)
Called pt back and informed her medication was sent

## 2021-06-13 ENCOUNTER — Other Ambulatory Visit: Payer: Self-pay

## 2021-06-13 ENCOUNTER — Ambulatory Visit
Admission: RE | Admit: 2021-06-13 | Discharge: 2021-06-13 | Disposition: A | Discharge status: Home / Self Care | Payer: No Typology Code available for payment source | Source: Ambulatory Visit | Attending: Orthopaedic Surgery | Admitting: Orthopaedic Surgery

## 2021-06-13 DIAGNOSIS — M5416 Radiculopathy, lumbar region: Secondary | ICD-10-CM

## 2021-06-17 ENCOUNTER — Other Ambulatory Visit: Payer: Self-pay

## 2021-06-17 ENCOUNTER — Ambulatory Visit (INDEPENDENT_AMBULATORY_CARE_PROVIDER_SITE_OTHER): Payer: No Typology Code available for payment source | Admitting: Orthopaedic Surgery

## 2021-06-17 DIAGNOSIS — M5416 Radiculopathy, lumbar region: Secondary | ICD-10-CM | POA: Diagnosis not present

## 2021-06-17 NOTE — Progress Notes (Signed)
Chief Complaint: Right back pain radiating down leg     History of Present Illness:   06/17/2021: Victoria [name] presents today for follow-up.  She is here for MRI results.  Overall she states that the back pain has improved dramatically although there is still some numbness.   Victoria [name] is a 61 y.o. female presents with right buttocks pain radiating down the leg down the posterior thigh and calf.  She states that this is been going on for 2 weeks.  She does have a history of being told that she had a slipped disc which resolved with going to the chiropractor.  She is taking ibuprofen which helps.  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  She was given Flexeril by the emergency room but she is not taking this.  She states that the pain is little bit worse since this occurred 2 weeks ago.  This is positional and she is having a hard time laying directly on the side    Surgical History:   None  PMH/PSH/Family History/Social History/Meds/Allergies:    Past Medical History:  Diagnosis Date   Allergic rhinitis    Anemia    Anemia, iron deficiency    Back pain    Gallbladder problem    Heavy menses    Hyperglycemia 02/22/2016   Lipoma    on L arm   SOBOE (shortness of breath on exertion)    Uterine fibroid    Past Surgical History:  Procedure Laterality Date   CESAREAN SECTION     CHOLECYSTECTOMY     11/07/11   ENDOMETRIAL ABLATION     fibroids   Pelvic U/S and transvaginal  04/27/2009   fibroids x 2   TONSILLECTOMY     TUBAL LIGATION     Uterine ultrasound  06/1998   and endo biopsy- neg   Social History   Socioeconomic History   Marital status: Married    Spouse name: Not on file   Number of children: 3   Years of education: Not on file   Highest education level: Not on file  Occupational History   Occupation: Cosmotologist    Employer: SELF  Tobacco Use   Smoking status: Never   Smokeless tobacco: Never  Substance  and Sexual Activity   Alcohol use: No    Alcohol/week: 0.0 standard drinks   Drug use: No   Sexual activity: Yes    Partners: Male    Birth control/protection: None  Other Topics Concern   Not on file  Social History Narrative   Not on file   Social Determinants of Health   Financial Resource Strain: Not on file  Food Insecurity: Not on file  Transportation Needs: Not on file  Physical Activity: Not on file  Stress: Not on file  Social Connections: Not on file   Family History  Problem Relation Age of Onset   Diabetes Mother    Hypertension Mother    Hyperlipidemia Mother    Cancer Mother        uterine cancer   Lung cancer Mother        died 16   Diabetes Father    Alcohol abuse Father    Hypertension Father    Heart disease Father    Sudden death Father  Alcoholism Father    Alcohol abuse Sister    Hypertension Sister    Diabetes Sister        boarderline   Alcohol abuse Brother    Hypertension Brother    Diabetes Maternal Grandmother    Alcohol abuse Paternal Grandfather    Alcohol abuse Brother    Alcohol abuse Brother    Alcohol abuse Brother    Diabetes Sister        severe   Colon cancer Neg Hx    Breast cancer Neg Hx    Allergies  Allergen Reactions   Codeine     REACTION: nausea and vomiting   Doxycycline Nausea Only   Current Outpatient Medications  Medication Sig Dispense Refill   IBUPROFEN PO Take by mouth. Prn     LORazepam (ATIVAN) 1 MG tablet Take 1 tablet (1 mg total) by mouth every 8 (eight) hours. 1 tablet 0   meloxicam (MOBIC) 15 MG tablet Take 1 tablet (15 mg total) by mouth daily. 30 tablet 2   methylPREDNISolone (MEDROL DOSEPAK) 4 MG TBPK tablet Take per packet instructions 21 tablet 0   Multiple Vitamin (MULTIVITAMIN) tablet Take 1 tablet by mouth daily.     No current facility-administered medications for this visit.   No results found.  Review of Systems:   A ROS was performed including pertinent positives and  negatives as documented in the HPI.  Physical Exam :   Constitutional: NAD and appears stated age Neurological: Alert and oriented Psych: Appropriate affect and cooperative There were no vitals taken for this visit.   Comprehensive Musculoskeletal Exam:    Inspection Right Left  Skin No atrophy or gross abnormalities appreciated No atrophy or gross abnormalities appreciated  Palpation    Tenderness None None  Crepitus None None  Range of Motion    Flexion (passive) 120 120  Extension 30 30  IR 30 30  ER 45 45  Strength    Flexion  5/5 5/5  Extension 5/5 5/5  Special Tests    FABIR Negative Negative  FADER Negative Negative  ER Lag/Capsular Insufficiency Negative Negative  Instability Negative Negative  Sacroiliac pain Negative  Negative   Instability    Generalized Laxity No No  Neurologic    sciatic, femoral, obturator nerves intact to light sensation  Vascular/Lymphatic    DP pulse 2+ 2+  Lumbar Exam    Patient has symmetric lumbar range of motion with negative pain referral to hip   Positive straight leg raise  Imaging:   Xray (3 views AP pelvis right hip): Very mild osteoarthritis involving the right hip  MRI lumbar spine: There is a small paracentral disc herniation on the right side involving the L5-S1  I personally reviewed and interpreted the radiographs.   Assessment:   60 year old female with right lower back pain and buttocks pain consistent with lumbar radiculitis.  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  Overall this is feeling better at this time.  I have described that we can perform a steroid injection she should not continue to fail to improve  Plan :    -She will follow-up as needed     I personally saw and evaluated the patient, and participated in the management and treatment plan.  [name], MD Attending Physician, Orthopedic Surgery  This document was dictated using Dragon voice recognition  software. A reasonable attempt at proof reading has been made to minimize errors.

## 2021-07-08 IMAGING — MG MM DIGITAL SCREENING BILAT W/ TOMO AND CAD
8 series · 8 of 24 positions shown · non-contrast
Comparison: Previous exam(s).

CLINICAL DATA: Screening.

EXAM:
DIGITAL SCREENING BILATERAL MAMMOGRAM WITH TOMOSYNTHESIS AND CAD
TECHNIQUE: Bilateral screening digital craniocaudal and mediolateral oblique
mammograms were obtained. Bilateral screening digital breast
tomosynthesis was performed. The images were evaluated with
computer-aided detection.

[R CC synth-2D]
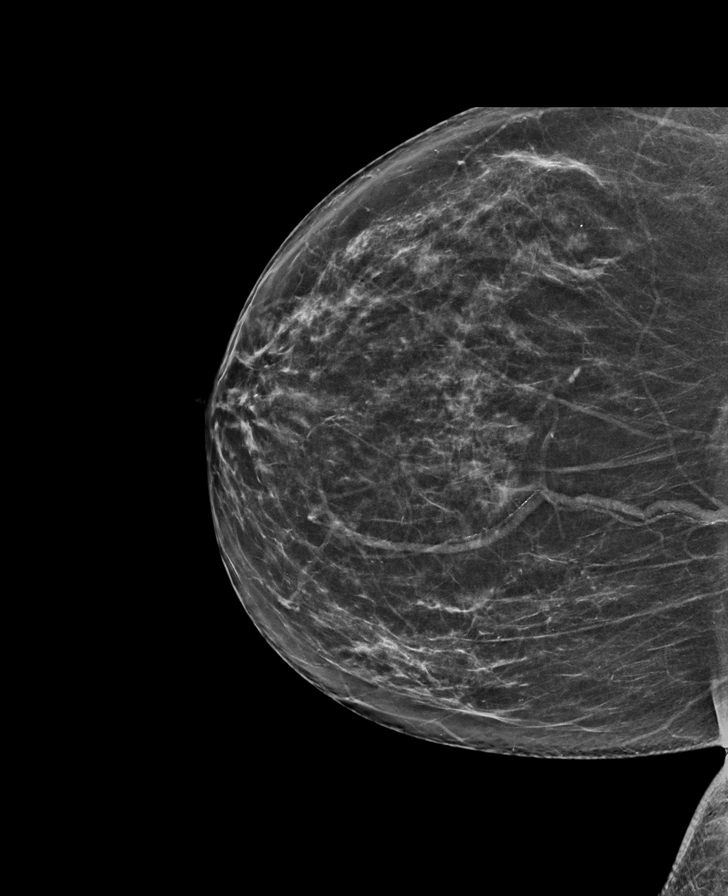

[L CC synth-2D]
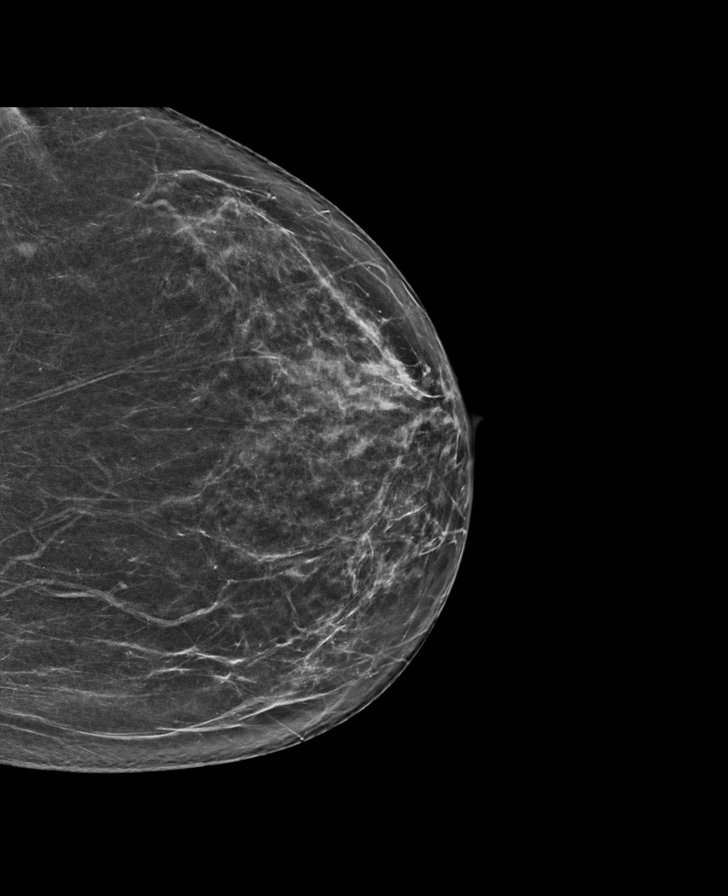

[R MLO synth-2D]
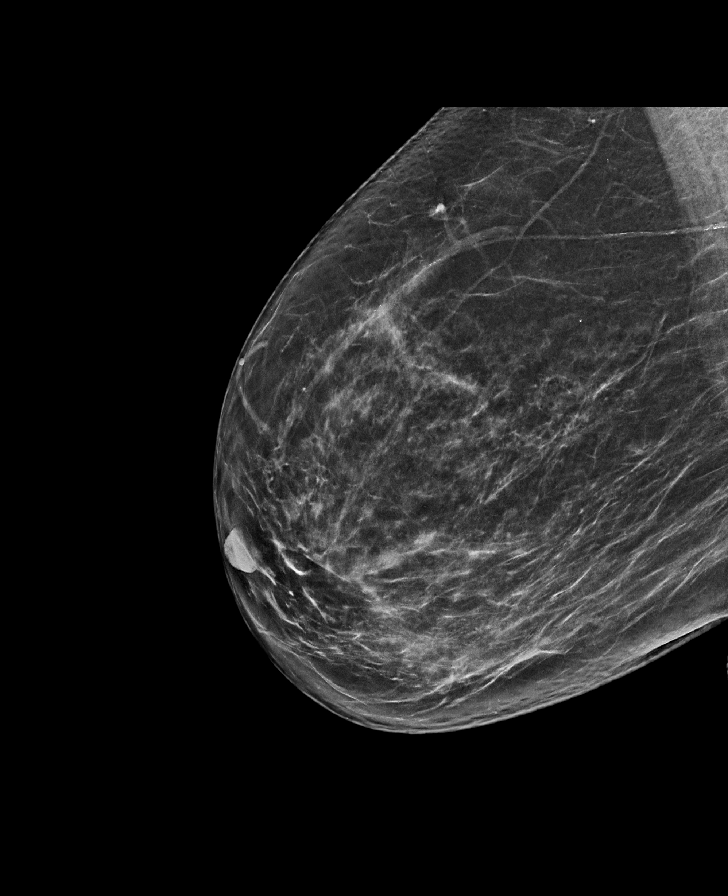

[L MLO synth-2D]
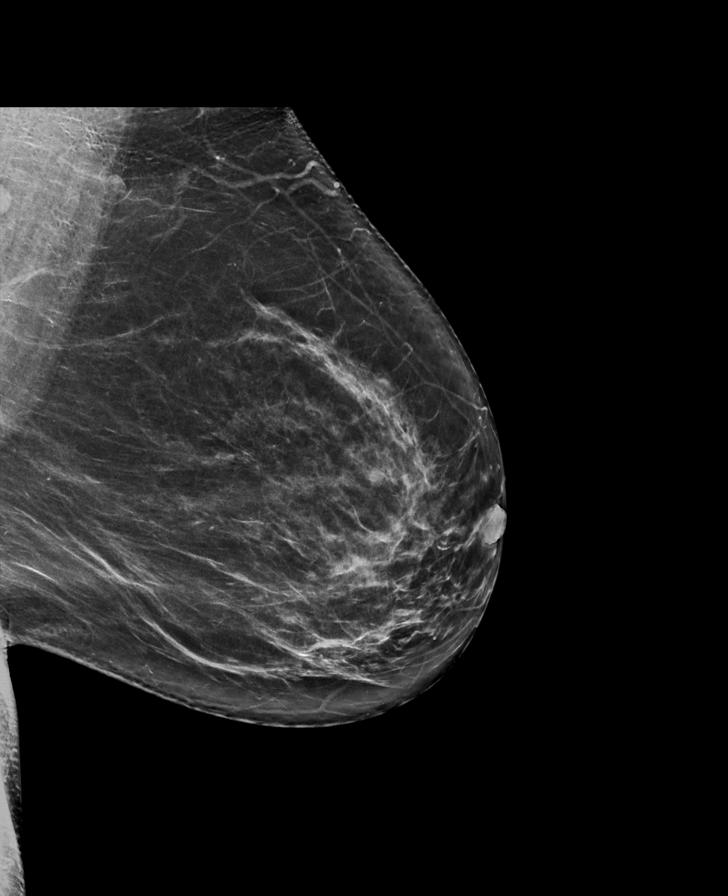

[L MLO tomo · tomo slice 42/83.0]
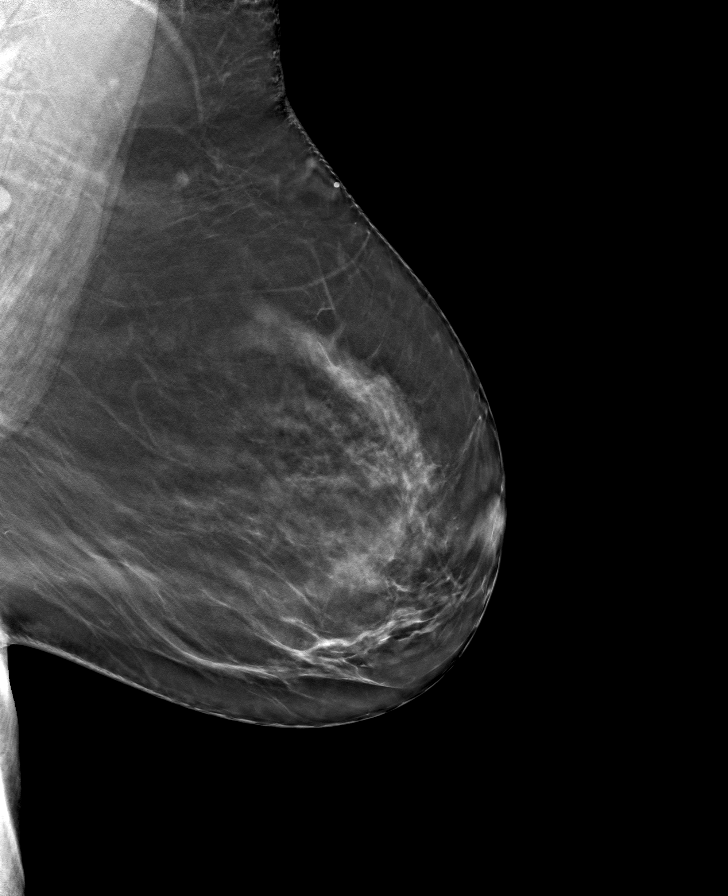

[R MLO tomo · tomo slice 39/78.0]
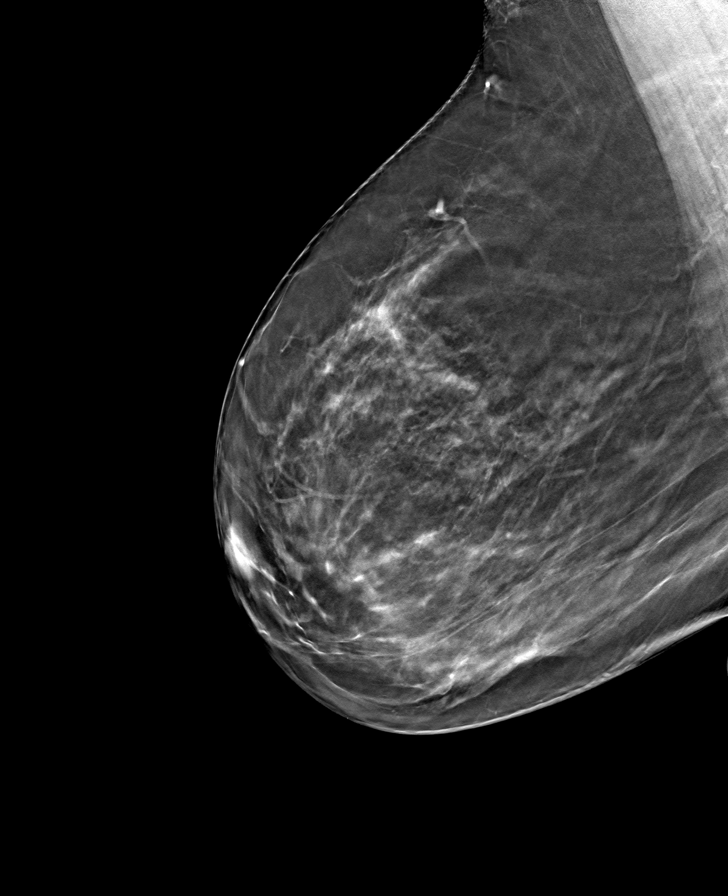

[L CC tomo · tomo slice 35/69.0]
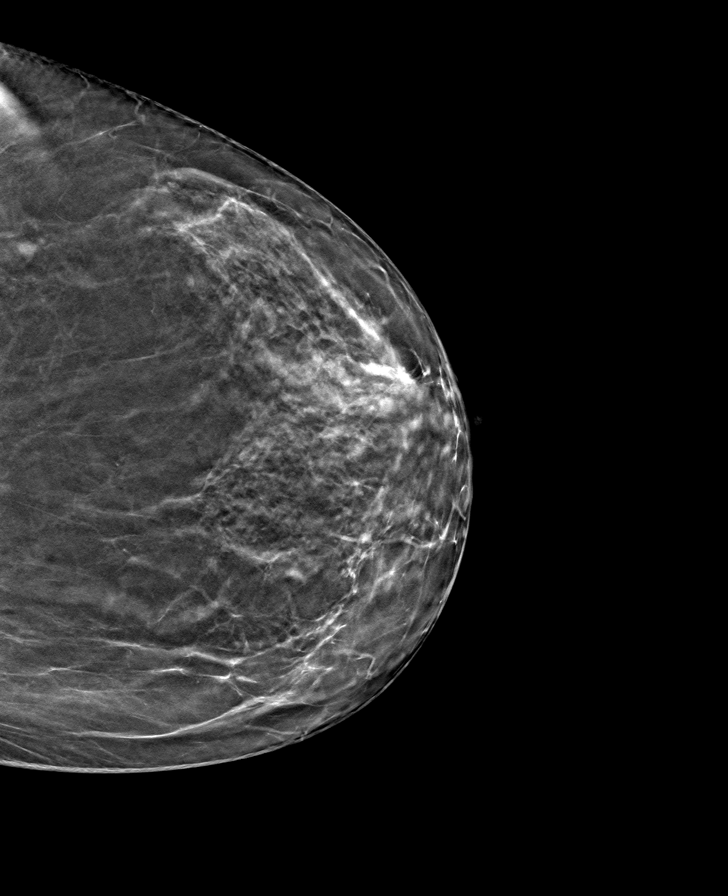

[R CC tomo · tomo slice 36/71.0]
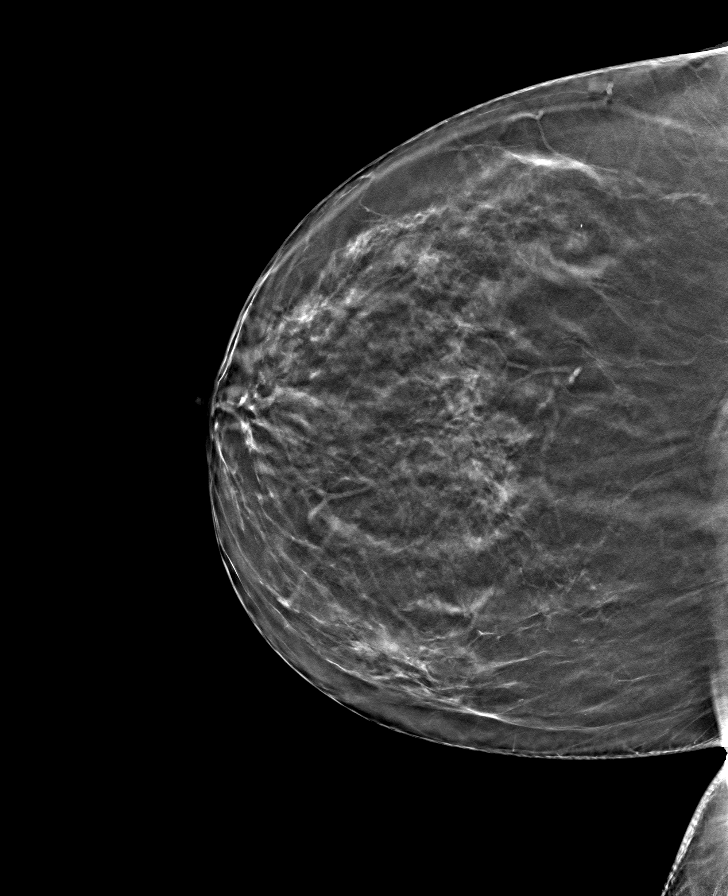

[8 of 24 positions shown; findings below may reference images not displayed]

ACR Breast Density Category b: There are scattered areas of
fibroglandular density.
FINDINGS: There are no findings suspicious for malignancy. The images were
evaluated with computer-aided detection.
IMPRESSION: No mammographic evidence of malignancy. A result letter of this
screening mammogram will be mailed directly to the patient.

RECOMMENDATION:
Screening mammogram in one year. (Code:[id number])

BI-RADS CATEGORY  1: Negative.

## 2021-07-15 ENCOUNTER — Encounter: Payer: Self-pay | Admitting: Gastroenterology

## 2021-07-18 ENCOUNTER — Telehealth: Payer: Self-pay | Admitting: Family Medicine

## 2021-07-18 DIAGNOSIS — Z0000 Encounter for general adult medical examination without abnormal findings: Secondary | ICD-10-CM

## 2021-07-18 DIAGNOSIS — R7303 Prediabetes: Secondary | ICD-10-CM

## 2021-07-18 DIAGNOSIS — E559 Vitamin D deficiency, unspecified: Secondary | ICD-10-CM

## 2021-07-18 DIAGNOSIS — Z8639 Personal history of other endocrine, nutritional and metabolic disease: Secondary | ICD-10-CM

## 2021-07-18 DIAGNOSIS — E7849 Other hyperlipidemia: Secondary | ICD-10-CM

## 2021-07-18 NOTE — Telephone Encounter (Signed)
-----   Message from [name] sent at 07/09/2021  7:29 AM EST ----- ?Regarding: Lab orders for Monday, 3.6.23 ?Patient is scheduled for CPX labs, please order future labs, Thanks , Terri ? ? ?

## 2021-07-19 ENCOUNTER — Other Ambulatory Visit: Payer: No Typology Code available for payment source

## 2021-07-27 ENCOUNTER — Ambulatory Visit (INDEPENDENT_AMBULATORY_CARE_PROVIDER_SITE_OTHER): Payer: No Typology Code available for payment source | Admitting: Family Medicine

## 2021-07-27 ENCOUNTER — Encounter: Payer: Self-pay | Admitting: Family Medicine

## 2021-07-27 ENCOUNTER — Other Ambulatory Visit (HOSPITAL_COMMUNITY)
Admission: RE | Admit: 2021-07-27 | Discharge: 2021-07-27 | Disposition: A | Discharge status: Home / Self Care | Payer: No Typology Code available for payment source | Source: Ambulatory Visit | Attending: Family Medicine | Admitting: Family Medicine

## 2021-07-27 ENCOUNTER — Other Ambulatory Visit: Payer: Self-pay

## 2021-07-27 VITALS — BP 140/68 | HR 81 | Ht 62.5 in | Wt 208.2 lb

## 2021-07-27 DIAGNOSIS — E559 Vitamin D deficiency, unspecified: Secondary | ICD-10-CM

## 2021-07-27 DIAGNOSIS — Z1231 Encounter for screening mammogram for malignant neoplasm of breast: Secondary | ICD-10-CM

## 2021-07-27 DIAGNOSIS — E7849 Other hyperlipidemia: Secondary | ICD-10-CM

## 2021-07-27 DIAGNOSIS — Z01419 Encounter for gynecological examination (general) (routine) without abnormal findings: Secondary | ICD-10-CM

## 2021-07-27 DIAGNOSIS — B009 Herpesviral infection, unspecified: Secondary | ICD-10-CM

## 2021-07-27 DIAGNOSIS — Z0000 Encounter for general adult medical examination without abnormal findings: Secondary | ICD-10-CM | POA: Diagnosis not present

## 2021-07-27 DIAGNOSIS — Z1211 Encounter for screening for malignant neoplasm of colon: Secondary | ICD-10-CM

## 2021-07-27 DIAGNOSIS — R7303 Prediabetes: Secondary | ICD-10-CM

## 2021-07-27 DIAGNOSIS — Z6836 Body mass index (BMI) 36.0-36.9, adult: Secondary | ICD-10-CM

## 2021-07-27 MED ORDER — VALACYCLOVIR HCL 1 G PO TABS: 1000.0000 mg | ORAL_TABLET | Freq: Two times a day (BID) | ORAL | 0 refills | Status: DC

## 2021-07-27 NOTE — Assessment & Plan Note (Signed)
A1c at the [facility] clinic was 5.7 which is improved ?Discussed low glycemic diet ?

## 2021-07-27 NOTE — Patient Instructions (Addendum)
If you are interested in the shingles vaccine series (Shingrix), call your insurance or pharmacy to check on coverage and location it must be given.  If affordable - you can schedule it here or at your pharmacy depending on coverage  ? ? ?For cholesterol  ?Avoid red meat/ fried foods/ egg yolks/ fatty breakfast meats/ butter, cheese and high fat dairy/ and shellfish   ?Try [place] bacon or sausage  ?Ground [place] is a good sub when cooking  ?Let's check cholesterol in 3 months  ? ?Dove soap for sensitive skin is my favorite  ? 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 ? ? ?Call and schedule your colonoscopy and your mammogram  ? ?Please call the location of your choice from the menu below to schedule your Mammogram and/or Bone Density appointment.   ? ?[place]  ? ?[facility]                ?      Phone:  [phone] ?[address]                               ?[place], [place] [address]                                                             ?Services: Traditional and 3D Mammogram, Bone Density  ? ?[facility]           ?      Phone: [phone] ?[address]                                                       ?[place], [place] [address]    ?Service: Bone Density ONLY  ? *this site does NOT perform mammograms ? ?[facility]                       ? Phone:  [phone] ?[address]                                  ?[place], [place] [address]                                            ?Services:  3D Mammogram and Bone Density  ? ? ?[place] ? ?[place] [facility]   ?Phone:  [phone]   ?[place], [place] [address]                                            ?Services: 3D Mammogram and Bone Density ? ?[place] [facility])  ?Phone:  [phone]   ?[address]                        ?[place], [place] [address]                                              ?Services:  3D Mammogram and Bone Density ? ? ? ? ?

## 2021-07-27 NOTE — Assessment & Plan Note (Signed)
Small cluster of vesicles noted on the left inner thigh that began 2 days ago ?No known exposure ?Small spot on left chin consistent with cold sore as well ?Plan to treat with Valtrex 1 g twice daily for 7 days and report back ?Handout given ?

## 2021-07-27 NOTE — Assessment & Plan Note (Signed)
Mammogram order done ?Pt will call and schedule ?

## 2021-07-27 NOTE — Assessment & Plan Note (Signed)
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 ?

## 2021-07-27 NOTE — Progress Notes (Signed)
? ?Subjective:  ? ? Patient ID: Victoria [name], female    DOB: Nov 18, 1960, 61 y.o.   MRN: [id number] ? ?This visit occurred during the SARS-CoV-2 public health emergency.  Safety protocols were in place, including screening questions prior to the visit, additional usage of staff PPE, and extensive cleaning of exam room while observing appropriate contact time as indicated for disinfecting solutions.  ? ?HPI ?Here for health maintenance exam and to review chronic medical problems   ? ?Wt Readings from Last 3 Encounters:  ?07/27/21 208 lb 3.2 oz (94.4 kg)  ?01/07/21 207 lb (93.9 kg)  ?08/20/20 198 lb (89.8 kg)  ? ?37.47 kg/m? ? ?Doing ok overall  ?Has trouble with sciatic nerve over the holidays  ?Lasted 4 weeks  ?Better now  ?Doing some core exercises and walking now  ? ? ?Declines flu shots  ?Covid immunized  ?Tdap 11/2014 ?Zoster status : interested in shingrix later possibly  ? ?Has a few blisters and ? If they came from recent covid shot  ?Chin and groin area  ?Chills for 2 d  ?Arm is swollen and sore  ?Took some tylenol  ?Extra tired this time  ? ?Colonoscopy 07/2011 Dr Ardis Hughs ?Due for this  ?Got the reminder letter to schedule  ? ?Pap 05/2018 normal with atrophy  ?Wants to do today  ?No bleeding or gyn issues  ? ?Lots of hot flashes and night sweats  ?Going to work on wt loss  ?Prefers no hormones  ? ?Mammogram 08/2020 at the breast center ?Self breast exam : no lumps ? ? ?BP Readings from Last 3 Encounters:  ?07/27/21 140/68  ?01/07/21 120/70  ?08/20/20 125/69  ? ?Pulse Readings from Last 3 Encounters:  ?07/27/21 81  ?01/07/21 75  ?08/20/20 82  ? ? ? ? ?H/o anemia ?Lab Results  ?Component Value Date  ? WBC 6.6 07/23/2020  ? HGB 14.6 07/23/2020  ? HCT 44.9 07/23/2020  ? MCV 83 07/23/2020  ? PLT 292 07/23/2020  ? ?Lab Results  ?Component Value Date  ? IRON 67 05/31/2010  ? ?Due for labs  ? ?H/o hyperlipidemia ?Lab Results  ?Component Value Date  ? CHOL 203 (H) 07/23/2020  ? HDL 44 07/23/2020  ? LDLCALC 138 (H)  07/23/2020  ? TRIG 114 07/23/2020  ? CHOLHDL 4.6 (H) 07/23/2020  ? ? ?Recent labs at [facility] ?Total cholesterol 206, triglycerides 70, HDL 46, LDL 146 ? ?LDL is up  ?Diet is fair  ?No red meat  ?Occ fries   (once per week)  ? ?The 10-year ASCVD risk score (Arnett DK, et al., 2019) is: 7.7% ?  Values used to calculate the score: ?    Age: 45 years ?    Sex: Female ?    Is Non-Hispanic African American: Yes ?    Diabetic: No ?    Tobacco smoker: No ?    Systolic Blood Pressure: 003 mmHg ?    Is BP treated: No ?    HDL Cholesterol: 44 mg/dL ?    Total Cholesterol: 203 mg/dL ? ? ?Other notable labs from [facility] include low vitamin D level at 20.6 mildly low iron level at 42 normal ferritin at 64 ?TSH of 1.679, free T3 of 2.8, free T4 of 0.87 ? ? ?Prediabetes ?Lab Results  ?Component Value Date  ? HGBA1C 6.0 (H) 07/23/2020  ?Had this tested at [facility]  ?Aic 5.7 ?Improved / watching the carbs  ?Glucose 101 ?Insulin 14.6 normal ?Leptin 89 ? ?  Walking for exercise  ?Weights now  ? ?Patient Active Problem List  ? Diagnosis Date Noted  ? Encounter for screening mammogram for breast cancer 07/27/2021  ? Herpes simplex 07/27/2021  ? Vitamin D deficiency 08/06/2020  ? At risk for osteoporosis 08/06/2020  ? History of iron deficiency 08/06/2020  ? Back pain 07/23/2020  ? SOBOE (shortness of breath on exertion) 07/23/2020  ? Anemia 07/23/2020  ? At risk for heart disease 07/23/2020  ? H/O small bowel obstruction 08/28/2018  ? Prediabetes 06/01/2018  ? Panic attacks 02/22/2016  ? Encounter for routine gynecological examination 09/02/2014  ? Special screening for malignant neoplasms, colon 06/08/2011  ? Hyperlipidemia 06/08/2011  ? Routine general medical examination at a health care facility 06/01/2011  ? LIPOMA 12/30/2009  ? FIBROIDS, UTERUS 04/27/2009  ? Obesity 03/19/2007  ? ALLERGIC RHINITIS 02/27/2007  ? ?Past Medical History:  ?Diagnosis Date  ? Allergic rhinitis   ? Anemia   ? Anemia, iron deficiency   ? Back pain    ? Gallbladder problem   ? Heavy menses   ? Hyperglycemia 02/22/2016  ? Lipoma   ? on L arm  ? SOBOE (shortness of breath on exertion)   ? Uterine fibroid   ? ?Past Surgical History:  ?Procedure Laterality Date  ? CESAREAN SECTION    ? CHOLECYSTECTOMY    ? 11/07/11  ? ENDOMETRIAL ABLATION    ? fibroids  ? Pelvic U/S and transvaginal  04/27/2009  ? fibroids x 2  ? TONSILLECTOMY    ? TUBAL LIGATION    ? Uterine ultrasound  06/1998  ? and endo biopsy- neg  ? ?Social History  ? ?Tobacco Use  ? Smoking status: Never  ? Smokeless tobacco: Never  ?Substance Use Topics  ? Alcohol use: No  ?  Alcohol/week: 0.0 standard drinks  ? Drug use: No  ? ?Family History  ?Problem Relation Age of Onset  ? Diabetes Mother   ? Hypertension Mother   ? Hyperlipidemia Mother   ? Cancer Mother   ?     uterine cancer  ? Lung cancer Mother   ?     died 30  ? Diabetes Father   ? Alcohol abuse Father   ? Hypertension Father   ? Heart disease Father   ? Sudden death Father   ? Alcoholism Father   ? Alcohol abuse Sister   ? Hypertension Sister   ? Diabetes Sister   ?     boarderline  ? Alcohol abuse Brother   ? Hypertension Brother   ? Diabetes Maternal Grandmother   ? Alcohol abuse Paternal Grandfather   ? Alcohol abuse Brother   ? Alcohol abuse Brother   ? Alcohol abuse Brother   ? Diabetes Sister   ?     severe  ? Colon cancer Neg Hx   ? Breast cancer Neg Hx   ? ?Allergies  ?Allergen Reactions  ? Codeine   ?  REACTION: nausea and vomiting  ? Doxycycline Nausea Only  ? ?Current Outpatient Medications on File Prior to Visit  ?Medication Sig Dispense Refill  ? Multiple Vitamin (MULTIVITAMIN) tablet Take 1 tablet by mouth daily.    ? IBUPROFEN PO Take by mouth. Prn (Patient not taking: Reported on 07/27/2021)    ? LORazepam (ATIVAN) 1 MG tablet Take 1 tablet (1 mg total) by mouth every 8 (eight) hours. (Patient not taking: Reported on 07/27/2021) 1 tablet 0  ? meloxicam (MOBIC) 15 MG tablet Take 1 tablet (15  mg total) by mouth daily. (Patient not  taking: Reported on 07/27/2021) 30 tablet 2  ? ?No current facility-administered medications on file prior to visit.  ?  ?Review of Systems  ?Constitutional:  Negative for activity change, appetite change, fatigue, fever and unexpected weight change.  ?HENT:  Negative for congestion, ear pain, rhinorrhea, sinus pressure and sore throat.   ?Eyes:  Negative for pain, redness and visual disturbance.  ?Respiratory:  Negative for cough, shortness of breath and wheezing.   ?Cardiovascular:  Negative for chest pain and palpitations.  ?Gastrointestinal:  Negative for abdominal pain, blood in stool, constipation and diarrhea.  ?Endocrine: Negative for polydipsia and polyuria.  ?Genitourinary:  Negative for dysuria, frequency and urgency.  ?Musculoskeletal:  Negative for arthralgias, back pain and myalgias.  ?Skin:  Positive for rash. Negative for pallor.  ?Allergic/Immunologic: Negative for environmental allergies.  ?Neurological:  Negative for dizziness, syncope and headaches.  ?Hematological:  Negative for adenopathy. Does not bruise/bleed easily.  ?Psychiatric/Behavioral:  Negative for decreased concentration and dysphoric mood. The patient is not nervous/anxious.   ? ?   ?Objective:  ? Physical Exam ?Constitutional:   ?   General: She is not in acute distress. ?   Appearance: Normal appearance. She is well-developed. She is obese. She is not ill-appearing or diaphoretic.  ?HENT:  ?   Head: Normocephalic and atraumatic.  ?   Right Ear: Tympanic membrane, ear canal and external ear normal.  ?   Left Ear: Tympanic membrane, ear canal and external ear normal.  ?   Nose: Nose normal. No congestion.  ?   Mouth/Throat:  ?   Mouth: Mucous membranes are moist.  ?   Pharynx: Oropharynx is clear. No posterior oropharyngeal erythema.  ?Eyes:  ?   General: No scleral icterus. ?   Extraocular Movements: Extraocular movements intact.  ?   Conjunctiva/sclera: Conjunctivae normal.  ?   Pupils: Pupils are equal, round, and reactive to  light.  ?Neck:  ?   Thyroid: No thyromegaly.  ?   Vascular: No carotid bruit or JVD.  ?Cardiovascular:  ?   Rate and Rhythm: Normal rate and regular rhythm.  ?   Pulses: Normal pulses.  ?   Heart sounds: Normal

## 2021-07-27 NOTE — Assessment & Plan Note (Signed)
Discussed how this problem influences overall health and the risks it imposes  Reviewed plan for weight loss with lower calorie diet (via better food choices and also portion control or program like weight watchers) and exercise building up to or more than 30 minutes 5 days per week including some aerobic activity    

## 2021-07-27 NOTE — Assessment & Plan Note (Signed)
B12 level is mildly note low from the [facility] at 20.6 ?Recommended 2000 units daily of vitamin D3 over-the-counter ?

## 2021-07-27 NOTE — Assessment & Plan Note (Signed)
Reviewed health habits including diet and exercise and skin cancer prevention ?Reviewed appropriate screening tests for age  ?Also reviewed health mt list, fam hx and immunization status , as well as social and family history   ?See HPI ?Labs reviewed (from [facility] clinic)  ?Patient declines flu shots ?Had a recent COVID booster some side effects ?Interested in the Shingrix vaccine if affordable and plans to check on that ?GYN exam and Pap done today without abnormalities ?Patient goes to the [facility] to work with specialists on weight loss but declines hormonal treatment ?Mammogram is due, order done and patient plans to schedule at the breast exam ?Colonoscopy ordered with Dr. Yates Decamp patient has number to call and schedule this ?

## 2021-07-27 NOTE — Assessment & Plan Note (Signed)
Colonoscopy is due for 10-year recall with Dr. Ardis Hughs ?Patient has a letter at home to call and schedule ?Referral done ?

## 2021-07-27 NOTE — Assessment & Plan Note (Signed)
Disc goals for lipids and reasons to control them ?Rev last labs with pt ?Rev low sat fat diet in detail ?Most recent labs from the [facility] reviewed with patient today ?Total cholesterol 206 triglycerides 70 HDL 46 and LDL up to 146 ?Patient declines statin or any other therapy currently ?Reviewed lower trans and sat fat diet to follow and plan to check this again in 3 months ?

## 2021-07-30 LAB — CYTOLOGY - PAP
Comment: NEGATIVE
Diagnosis: NEGATIVE
High risk HPV: NEGATIVE

## 2021-08-03 ENCOUNTER — Encounter: Payer: Self-pay | Admitting: Gastroenterology

## 2021-08-30 ENCOUNTER — Ambulatory Visit
Admission: RE | Admit: 2021-08-30 | Discharge: 2021-08-30 | Disposition: A | Discharge status: Home / Self Care | Payer: No Typology Code available for payment source | Source: Ambulatory Visit | Attending: Family Medicine | Admitting: Family Medicine

## 2021-08-30 ENCOUNTER — Ambulatory Visit (AMBULATORY_SURGERY_CENTER): Payer: No Typology Code available for payment source | Admitting: *Deleted

## 2021-08-30 VITALS — Ht 63.0 in | Wt 201.0 lb

## 2021-08-30 DIAGNOSIS — Z1211 Encounter for screening for malignant neoplasm of colon: Secondary | ICD-10-CM

## 2021-08-30 DIAGNOSIS — Z1231 Encounter for screening mammogram for malignant neoplasm of breast: Secondary | ICD-10-CM

## 2021-08-30 MED ORDER — NA SULFATE-K SULFATE-MG SULF 17.5-3.13-1.6 GM/177ML PO SOLN: 2.0000 | Freq: Once | ORAL | 0 refills | Status: AC

## 2021-08-30 NOTE — Progress Notes (Signed)
No egg or soy allergy known to patient  No issues known to pt with past sedation with any surgeries or procedures Patient denies ever being told they had issues or difficulty with intubation  No FH of Malignant Hyperthermia Pt is not on diet pills Pt is not on  home 02  Pt is not on blood thinners  Pt denies issues with constipation  No A fib or A flutter  Pt instructed to use Singlecare.com or GoodRx for a price reduction on prep   PV completed over the phone. Pt verified name, DOB, address and insurance during PV today.   Pt encouraged to call with questions or issues.  If pt has My chart, procedure instructions sent via My Chart   

## 2021-09-13 ENCOUNTER — Encounter: Payer: Self-pay | Admitting: Gastroenterology

## 2021-09-13 ENCOUNTER — Ambulatory Visit (AMBULATORY_SURGERY_CENTER): Payer: No Typology Code available for payment source | Admitting: Gastroenterology

## 2021-09-13 VITALS — BP 122/73 | HR 63 | Temp 97.1°F | Resp 29 | Ht 62.5 in | Wt 201.0 lb

## 2021-09-13 DIAGNOSIS — Z1211 Encounter for screening for malignant neoplasm of colon: Secondary | ICD-10-CM

## 2021-09-13 MED ORDER — SODIUM CHLORIDE 0.9 % IV SOLN: 500.0000 mL | Freq: Once | INTRAVENOUS | Status: DC

## 2021-09-13 NOTE — Patient Instructions (Signed)
Read all of the handouts given to you by your recovery room nurse. ? ?YOU HAD AN ENDOSCOPIC PROCEDURE TODAY AT [facility]:   Refer to the procedure report that was given to you for any specific questions about what was found during the examination.  If the procedure report does not answer your questions, please call your gastroenterologist to clarify.  If you requested that your care partner not be given the details of your procedure findings, then the procedure report has been included in a sealed envelope for you to review at your convenience later. ? ?YOU SHOULD EXPECT: Some feelings of bloating in the abdomen. Passage of more gas than usual.  Walking can help get rid of the air that was put into your GI tract during the procedure and reduce the bloating. If you had a lower endoscopy (such as a colonoscopy or flexible sigmoidoscopy) you may notice spotting of blood in your stool or on the toilet paper. If you underwent a bowel prep for your procedure, you may not have a normal bowel movement for a few days. ? ?Please Note:  You might notice some irritation and congestion in your nose or some drainage.  This is from the oxygen used during your procedure.  There is no need for concern and it should clear up in a day or so. ? ?SYMPTOMS TO REPORT IMMEDIATELY: ? ?Following lower endoscopy (colonoscopy or flexible sigmoidoscopy): ? Excessive amounts of blood in the stool ? Significant tenderness or worsening of abdominal pains ? Swelling of the abdomen that is new, acute ? Fever of 100?F or higher ? ?For urgent or emergent issues, a gastroenterologist can be reached at any hour by calling [phone]. ?Do not use MyChart messaging for urgent concerns.  ? ? ?DIET:  We do recommend a small meal at first, but then you may proceed to your regular diet.  Drink plenty of fluids but you should avoid alcoholic beverages for 24 hours. ?Try to increase the fiber in your diet, and drink plenty of  water. ? ?ACTIVITY:  You should plan to take it easy for the rest of today and you should NOT DRIVE or use heavy machinery until tomorrow (because of the sedation medicines used during the test).   ? ?FOLLOW UP: ?Our staff will call the number listed on your records 48-72 hours following your procedure to check on you and address any questions or concerns that you may have regarding the information given to you following your procedure. If we do not reach you, we will leave a message.  We will attempt to reach you two times.  During this call, we will ask if you have developed any symptoms of COVID 19. If you develop any symptoms (ie: fever, flu-like symptoms, shortness of breath, cough etc.) before then, please call [phone].  If you test positive for Covid 19 in the 2 weeks post procedure, please call and report this information to [name].   ? ? ?SIGNATURES/CONFIDENTIALITY: ?You and/or your care partner have signed paperwork which will be entered into your electronic medical record.  These signatures attest to the fact that that the information above on your After Visit Summary has been reviewed and is understood.  Full responsibility of the confidentiality of this discharge information lies with you and/or your care-partner.  ?

## 2021-09-13 NOTE — Progress Notes (Signed)
To Pacu, VSS. Report to [email or web address] 

## 2021-09-13 NOTE — Progress Notes (Signed)
Pt's states no medical or surgical changes since previsit or office visit. VS assessed by N.C ?

## 2021-09-13 NOTE — Op Note (Signed)
[place] ?Patient Name: Victoria [name] ?Procedure Date: 09/13/2021 10:20 AM ?MRN: [id number] ?Endoscopist: Milus Banister , MD ?Age: 61 ?Referring MD:  ?Date of Birth: 01/29/1961 ?Gender: Female ?Account #: [id number] ?Procedure:                Colonoscopy ?Indications:              Screening for colorectal malignant neoplasm ?Medicines:                Monitored Anesthesia Care ?Procedure:                Pre-Anesthesia Assessment: ?                          - Prior to the procedure, a History and Physical  ?                          was performed, and patient medications and  ?                          allergies were reviewed. The patient's tolerance of  ?                          previous anesthesia was also reviewed. The risks  ?                          and benefits of the procedure and the sedation  ?                          options and risks were discussed with the patient.  ?                          All questions were answered, and informed consent  ?                          was obtained. Prior Anticoagulants: The patient has  ?                          taken no previous anticoagulant or antiplatelet  ?                          agents. ASA Grade Assessment: II - A patient with  ?                          mild systemic disease. After reviewing the risks  ?                          and benefits, the patient was deemed in  ?                          satisfactory condition to undergo the procedure. ?                          After obtaining informed consent, the colonoscope  ?  was passed under direct vision. Throughout the  ?                          procedure, the patient's blood pressure, pulse, and  ?                          oxygen saturations were monitored continuously. The  ?                          Colonoscope was introduced through the anus and  ?                          advanced to the the cecum, identified by  ?                          appendiceal orifice and  ileocecal valve. The  ?                          colonoscopy was performed without difficulty. The  ?                          patient tolerated the procedure well. The quality  ?                          of the bowel preparation was good. The ileocecal  ?                          valve, appendiceal orifice, and rectum were  ?                          photographed. ?Scope In: 10:26:26 AM ?Scope Out: 10:35:59 AM ?Scope Withdrawal Time: 0 hours 6 minutes 30 seconds  ?Total Procedure Duration: 0 hours 9 minutes 33 seconds  ?Findings:                 Multiple small and large-mouthed diverticula were  ?                          found in the left colon. ?                          Internal hemorrhoids were found. The hemorrhoids  ?                          were small. ?                          The exam was otherwise without abnormality on  ?                          direct and retroflexion views. ?Complications:            No immediate complications. Estimated blood loss:  ?                          None. ?Estimated Blood Loss:  Estimated blood loss: none. ?Impression:               - Diverticulosis in the left colon. ?                          - Internal hemorrhoids. ?                          - The examination was otherwise normal on direct  ?                          and retroflexion views. ?                          - No polyps or cancers. ?Recommendation:           - Patient has a contact number available for  ?                          emergencies. The signs and symptoms of potential  ?                          delayed complications were discussed with the  ?                          patient. Return to normal activities tomorrow.  ?                          Written discharge instructions were provided to the  ?                          patient. ?                          - Resume previous diet. ?                          - Continue present medications. ?                          - Repeat colonoscopy in 10 years  for screening. ?Milus Banister, MD ?09/13/2021 10:38:34 AM ?This report has been signed electronically. ?

## 2021-09-13 NOTE — Progress Notes (Signed)
HPI: ?This is a woman at routine risk for CRC ? ? ?ROS: complete GI ROS as described in HPI, all other review negative. ? ?Constitutional:  No unintentional weight loss ? ? ?Past Medical History:  ?Diagnosis Date  ? Allergic rhinitis   ? Anemia   ? Anemia, iron deficiency   ? Back pain   ? Gallbladder problem   ? Heavy menses   ? Hyperglycemia 02/22/2016  ? Lipoma   ? on L arm  ? SOBOE (shortness of breath on exertion)   ? Uterine fibroid   ? ? ?Past Surgical History:  ?Procedure Laterality Date  ? CESAREAN SECTION    ? CHOLECYSTECTOMY    ? 11/07/11  ? ENDOMETRIAL ABLATION    ? fibroids  ? Pelvic U/S and transvaginal  04/27/2009  ? fibroids x 2  ? TONSILLECTOMY    ? TUBAL LIGATION    ? Uterine ultrasound  06/1998  ? and endo biopsy- neg  ? ? ?Current Outpatient Medications  ?Medication Sig Dispense Refill  ? IBUPROFEN PO Take by mouth. Prn    ? Multiple Vitamin (MULTIVITAMIN) tablet Take 1 tablet by mouth daily.    ? LORazepam (ATIVAN) 1 MG tablet Take 1 tablet (1 mg total) by mouth every 8 (eight) hours. (Patient not taking: Reported on 07/27/2021) 1 tablet 0  ? meloxicam (MOBIC) 15 MG tablet Take 1 tablet (15 mg total) by mouth daily. (Patient not taking: Reported on 07/27/2021) 30 tablet 2  ? valACYclovir (VALTREX) 1000 MG tablet Take 1 tablet (1,000 mg total) by mouth 2 (two) times daily. (Patient not taking: Reported on 08/30/2021) 14 tablet 0  ? ?Current Facility-Administered Medications  ?Medication Dose Route Frequency Provider Last Rate Last Admin  ? 0.9 %  sodium chloride infusion  500 mL Intravenous Once Milus Banister, MD      ? ? ?Allergies as of 09/13/2021 - Review Complete 09/13/2021  ?Allergen Reaction Noted  ? Codeine Other (See Comments) and Nausea And Vomiting 02/27/2007  ? Doxycycline Nausea Only 07/11/2011  ? ? ?Family History  ?Problem Relation Age of Onset  ? Diabetes Mother   ? Hypertension Mother   ? Hyperlipidemia Mother   ? Cancer Mother   ?     uterine cancer  ? Lung cancer Mother   ?      died 30  ? Diabetes Father   ? Alcohol abuse Father   ? Hypertension Father   ? Heart disease Father   ? Sudden death Father   ? Alcoholism Father   ? Alcohol abuse Sister   ? Hypertension Sister   ? Diabetes Sister   ?     boarderline  ? Diabetes Sister   ?     severe  ? Alcohol abuse Brother   ? Hypertension Brother   ? Alcohol abuse Brother   ? Alcohol abuse Brother   ? Alcohol abuse Brother   ? Diabetes Maternal Grandmother   ? Alcohol abuse Paternal Grandfather   ? Colon cancer Neg Hx   ? Breast cancer Neg Hx   ? Colon polyps Neg Hx   ? Esophageal cancer Neg Hx   ? Stomach cancer Neg Hx   ? Rectal cancer Neg Hx   ? ? ?Social History  ? ?Socioeconomic History  ? Marital status: Married  ?  Spouse name: Not on file  ? Number of children: 3  ? Years of education: Not on file  ? Highest education level: Not on file  ?  Occupational History  ? Occupation: Cosmotologist  ?  Employer: [employer]  ?Tobacco Use  ? Smoking status: Never  ? Smokeless tobacco: Never  ?Vaping Use  ? Vaping Use: Never used  ?Substance and Sexual Activity  ? Alcohol use: No  ?  Alcohol/week: 0.0 standard drinks  ? Drug use: No  ? Sexual activity: Yes  ?  Partners: Male  ?  Birth control/protection: None  ?Other Topics Concern  ? Not on file  ?Social History Narrative  ? Not on file  ? ?Social Determinants of Health  ? ?Financial Resource Strain: Not on file  ?Food Insecurity: Not on file  ?Transportation Needs: Not on file  ?Physical Activity: Not on file  ?Stress: Not on file  ?Social Connections: Not on file  ?Intimate Partner Violence: Not on file  ? ? ? ?Physical Exam: ?BP 140/78   Pulse 77   Temp (!) 97.1 ?F (36.2 ?C) (Skin)   Ht 5' 2.5" (1.588 m)   Wt 201 lb (91.2 kg)   SpO2 97%   BMI 36.18 kg/m?  ?Constitutional: generally well-appearing ?Psychiatric: alert and oriented x3 ?Lungs: CTA bilaterally ?Heart: no MCR ? ?Assessment and plan: ?61 y.o. female with routine risk for CRC ? ?Screening colonoscoyp today ? ?Care is appropriate for  the ambulatory setting. ? ?Owens Loffler, MD ?[facility] ?09/13/2021, 10:18 AM ? ? ? ?

## 2021-09-15 ENCOUNTER — Telehealth: Payer: Self-pay | Admitting: *Deleted

## 2021-09-15 NOTE — Telephone Encounter (Signed)
No answer on first attempt follow up call. Left message.  ?

## 2021-10-25 ENCOUNTER — Ambulatory Visit: Payer: No Typology Code available for payment source | Admitting: Family Medicine

## 2021-10-25 ENCOUNTER — Encounter: Payer: Self-pay | Admitting: Family Medicine

## 2021-10-25 VITALS — BP 150/80 | HR 73 | Temp 98.0°F | Ht 62.0 in | Wt 202.2 lb

## 2021-10-25 DIAGNOSIS — E7849 Other hyperlipidemia: Secondary | ICD-10-CM | POA: Diagnosis not present

## 2021-10-25 DIAGNOSIS — F410 Panic disorder [episodic paroxysmal anxiety] without agoraphobia: Secondary | ICD-10-CM

## 2021-10-25 DIAGNOSIS — Z0000 Encounter for general adult medical examination without abnormal findings: Secondary | ICD-10-CM

## 2021-10-25 DIAGNOSIS — J029 Acute pharyngitis, unspecified: Secondary | ICD-10-CM | POA: Diagnosis not present

## 2021-10-25 LAB — ALT: ALT: 16 U/L (ref 0–35)

## 2021-10-25 LAB — LIPID PANEL
Cholesterol: 199 mg/dL (ref 0–200)
HDL: 45.9 mg/dL (ref >39.00)
LDL Cholesterol: 131 mg/dL — ABNORMAL HIGH (ref 0–99)
NonHDL: 153.44
Total CHOL/HDL Ratio: 4
Triglycerides: 111 mg/dL (ref 0.0–149.0)
VLDL: 22.2 mg/dL (ref 0.0–40.0)

## 2021-10-25 LAB — POCT RAPID STREP A (OFFICE): Rapid Strep A Screen: NEGATIVE

## 2021-10-25 LAB — AST: AST: 16 U/L (ref 0–37)

## 2021-10-25 NOTE — Assessment & Plan Note (Signed)
More frequent since death of her brother Reviewed stressors/ coping techniques/symptoms/ support sources/ tx options and side effects in detail today  Overall getting by

## 2021-10-25 NOTE — Assessment & Plan Note (Signed)
Disc goals for lipids and reasons to control them Rev last labs with pt Rev low sat fat diet in detail Last LDL at other clinic was 146 ([facility])  Lab today  Improved habits If still high consider statin  Does have fam history

## 2021-10-25 NOTE — Patient Instructions (Addendum)
Drink lots of fluids   Watch for fever or other symptoms  Keep [name] posted   Tyelnol is ok   Lab today for cholesterol  Avoid red meat/ fried foods/ egg yolks/ fatty breakfast meats/ butter, cheese and high fat dairy/ and shellfish

## 2021-10-25 NOTE — Progress Notes (Signed)
Subjective:    Patient ID: Victoria [name], female    DOB: Nov 02, 1960, 61 y.o.   MRN: [id number]  HPI Pt presents for concerns about cholesterol  Wt Readings from Last 3 Encounters:  10/25/21 202 lb 3.2 oz (91.7 kg)  09/13/21 201 lb (91.2 kg)  08/30/21 201 lb (91.2 kg)   36.98 kg/m  Her brother died recently  From kidney disease and pneumonia   Some episodes of panic/anxiety  In last few weeks   Had a ST this am  Improved now  Sat outside this am  Has a sensitive throat to start with   Results for orders placed or performed in visit on 10/25/21  POCT rapid strep A  Result Value Ref Range   Rapid Strep A Screen Negative Negative     No fever or headache    Blood pressure is elevated today  Runs in family  In grief   No cp or palpitations or headaches or edema  No side effects to medicines  BP Readings from Last 3 Encounters:  10/25/21 (!) 150/80  09/13/21 122/73  07/27/21 140/68     Not taking meloxicam or ibuprofen    Cholesterol  Lab Results  Component Value Date   CHOL 203 (H) 07/23/2020   HDL 44 07/23/2020   LDLCALC 138 (H) 07/23/2020   TRIG 114 07/23/2020   CHOLHDL 4.6 (H) 07/23/2020   [facility]  LDL was up to 146 Declined statin at that time   The 10-year ASCVD risk score (Arnett DK, et al., 2019) is: 9.3%   Values used to calculate the score:     Age: 23 years     Sex: Female     Is Non-Hispanic African American: Yes     Diabetic: No     Tobacco smoker: No     Systolic Blood Pressure: 654 mmHg     Is BP treated: No     HDL Cholesterol: 44 mg/dL     Total Cholesterol: 203 mg/dL   Is caring for herself  Doing better  Less oil Criss Rosales  No red meat  Very seldom fried foods  Very seldom bacon Louanne Skye  No high fat dairy products   Has cut breads   Patient Active Problem List   Diagnosis Date Noted   Sore throat 10/25/2021   Encounter for screening mammogram for breast cancer 07/27/2021   Herpes  simplex 07/27/2021   Vitamin D deficiency 08/06/2020   At risk for osteoporosis 08/06/2020   History of iron deficiency 08/06/2020   Back pain 07/23/2020   SOBOE (shortness of breath on exertion) 07/23/2020   Anemia 07/23/2020   At risk for heart disease 07/23/2020   H/O small bowel obstruction 08/28/2018   Prediabetes 06/01/2018   Panic attacks 02/22/2016   Encounter for routine gynecological examination 09/02/2014   Special screening for malignant neoplasms, colon 06/08/2011   Hyperlipidemia 06/08/2011   Routine general medical examination at a health care facility 06/01/2011   LIPOMA 12/30/2009   FIBROIDS, UTERUS 04/27/2009   Obesity 03/19/2007   ALLERGIC RHINITIS 02/27/2007   Past Medical History:  Diagnosis Date   Allergic rhinitis    Anemia    Anemia, iron deficiency    Back pain    Gallbladder problem    Heavy menses    Hyperglycemia 02/22/2016   Lipoma    on L arm   SOBOE (shortness of breath on exertion)    Uterine fibroid  Past Surgical History:  Procedure Laterality Date   CESAREAN SECTION     CHOLECYSTECTOMY     11/07/11   ENDOMETRIAL ABLATION     fibroids   Pelvic U/S and transvaginal  04/27/2009   fibroids x 2   TONSILLECTOMY     TUBAL LIGATION     Uterine ultrasound  06/1998   and endo biopsy- neg   Social History   Tobacco Use   Smoking status: Never   Smokeless tobacco: Never  Vaping Use   Vaping Use: Never used  Substance Use Topics   Alcohol use: No    Alcohol/week: 0.0 standard drinks of alcohol   Drug use: No   Family History  Problem Relation Age of Onset   Diabetes Mother    Hypertension Mother    Hyperlipidemia Mother    Cancer Mother        uterine cancer   Lung cancer Mother        died 58   Diabetes Father    Alcohol abuse Father    Hypertension Father    Heart disease Father    Sudden death Father    Alcoholism Father    Alcohol abuse Sister    Hypertension Sister    Diabetes Sister        boarderline    Diabetes Sister        severe   Alcohol abuse Brother    Hypertension Brother    Alcohol abuse Brother    Alcohol abuse Brother    Alcohol abuse Brother    Diabetes Maternal Grandmother    Alcohol abuse Paternal Grandfather    Colon cancer Neg Hx    Breast cancer Neg Hx    Colon polyps Neg Hx    Esophageal cancer Neg Hx    Stomach cancer Neg Hx    Rectal cancer Neg Hx    Allergies  Allergen Reactions   Codeine Other (See Comments) and Nausea And Vomiting    REACTION: nausea and vomiting REACTION: nausea and vomiting    Doxycycline Nausea Only   Current Outpatient Medications on File Prior to Visit  Medication Sig Dispense Refill   cholecalciferol (VITAMIN D3) 25 MCG (1000 UNIT) tablet Take 1,000 Units by mouth 2 (two) times daily.     LORazepam (ATIVAN) 1 MG tablet Take 1 tablet (1 mg total) by mouth every 8 (eight) hours. (Patient not taking: Reported on 07/27/2021) 1 tablet 0   meloxicam (MOBIC) 15 MG tablet Take 1 tablet (15 mg total) by mouth daily. (Patient not taking: Reported on 07/27/2021) 30 tablet 2   Multiple Vitamin (MULTIVITAMIN) tablet Take 1 tablet by mouth daily. (Patient not taking: Reported on 10/25/2021)     valACYclovir (VALTREX) 1000 MG tablet Take 1 tablet (1,000 mg total) by mouth 2 (two) times daily. (Patient not taking: Reported on 08/30/2021) 14 tablet 0   No current facility-administered medications on file prior to visit.      Review of Systems  Constitutional:  Positive for fatigue. Negative for activity change, appetite change, fever and unexpected weight change.  HENT:  Positive for postnasal drip and sore throat. Negative for congestion, ear pain, rhinorrhea and sinus pressure.   Eyes:  Negative for pain, redness and visual disturbance.  Respiratory:  Negative for cough, shortness of breath and wheezing.   Cardiovascular:  Negative for chest pain and palpitations.  Gastrointestinal:  Negative for abdominal pain, blood in stool, constipation and  diarrhea.  Endocrine: Negative for polydipsia and polyuria.  Genitourinary:  Negative for dysuria, frequency and urgency.  Musculoskeletal:  Negative for arthralgias, back pain and myalgias.  Skin:  Negative for pallor and rash.  Allergic/Immunologic: Negative for environmental allergies.  Neurological:  Negative for dizziness, syncope and headaches.  Hematological:  Negative for adenopathy. Does not bruise/bleed easily.  Psychiatric/Behavioral:  Negative for decreased concentration and dysphoric mood. The patient is not nervous/anxious.        Objective:   Physical Exam Constitutional:      General: She is not in acute distress.    Appearance: She is well-developed. She is obese. She is not ill-appearing or diaphoretic.  HENT:     Head: Normocephalic and atraumatic.     Right Ear: Tympanic membrane and ear canal normal.     Left Ear: Tympanic membrane and ear canal normal.     Nose: No congestion or rhinorrhea.     Mouth/Throat:     Mouth: Mucous membranes are moist.     Pharynx: Oropharynx is clear. No pharyngeal swelling or oropharyngeal exudate.     Comments: Scant posterior throat injection  Eyes:     Conjunctiva/sclera: Conjunctivae normal.     Pupils: Pupils are equal, round, and reactive to light.  Neck:     Thyroid: No thyromegaly.     Vascular: No carotid bruit or JVD.  Cardiovascular:     Rate and Rhythm: Normal rate and regular rhythm.     Heart sounds: Normal heart sounds.     No gallop.  Pulmonary:     Effort: Pulmonary effort is normal. No respiratory distress.     Breath sounds: Normal breath sounds. No wheezing or rales.  Abdominal:     General: There is no distension or abdominal bruit.     Palpations: Abdomen is soft.  Musculoskeletal:     Cervical back: Normal range of motion and neck supple.     Right lower leg: No edema.     Left lower leg: No edema.  Lymphadenopathy:     Cervical: No cervical adenopathy.  Skin:    General: Skin is warm and dry.      Coloration: Skin is not pale.     Findings: No rash.  Neurological:     Mental Status: She is alert.     Coordination: Coordination normal.     Deep Tendon Reflexes: Reflexes are normal and symmetric. Reflexes normal.  Psychiatric:        Attention and Perception: Attention normal.        Mood and Affect: Mood is anxious.        Cognition and Memory: Cognition and memory normal.     Comments: Mildly anxious Pleasant  Candidly discusses symptoms and stressors             Assessment & Plan:   Problem List Items Addressed This Visit       Other   Hyperlipidemia - Primary    Disc goals for lipids and reasons to control them Rev last labs with pt Rev low sat fat diet in detail Last LDL at other clinic was 146 ([facility])  Lab today  Improved habits If still high consider statin  Does have fam history      Relevant Orders   ALT   AST   Panic attacks    More frequent since death of her brother Reviewed stressors/ coping techniques/symptoms/ support sources/ tx options and side effects in detail today  Overall getting by       Routine  general medical examination at a health care facility   Sore throat    Mild this am  Improved now  Nl exam with scant post injection of throat  Neg strep test  Will monitor for other symptoms No h/o tick bite or rash      Relevant Orders   POCT rapid strep A (Completed)

## 2021-10-25 NOTE — Assessment & Plan Note (Signed)
Mild this am  Improved now  Nl exam with scant post injection of throat  Neg strep test  Will monitor for other symptoms No h/o tick bite or rash

## 2021-10-27 ENCOUNTER — Telehealth: Payer: Self-pay | Admitting: Family Medicine

## 2021-10-27 NOTE — Telephone Encounter (Signed)
Pt called back about message below:    Victoria [name], Victoria [name]  10/27/2021 10:00 AM EDT Back to Top    [email or web address]   Abner Greenspan, MD  10/25/2021  7:52 PM EDT     Your cholesterol is slightly improved from a year ago  Better than the last draw at the outside clinic I think your LDL is down from 146 to 131 (correct me if I am wrong about that outside number) Keep working on a low cholesterol diet  Avoid red meat/ fried foods/ egg yolks/ fatty breakfast meats/ butter, cheese and high fat dairy/ and shellfish     I made her aware of Dr Alba Cory message and she didn't have any questions at this time

## 2021-12-22 ENCOUNTER — Encounter (INDEPENDENT_AMBULATORY_CARE_PROVIDER_SITE_OTHER): Payer: Self-pay

## 2022-07-05 ENCOUNTER — Ambulatory Visit: Payer: No Typology Code available for payment source | Admitting: Family Medicine

## 2022-07-26 ENCOUNTER — Telehealth: Payer: Self-pay | Admitting: Family Medicine

## 2022-07-26 DIAGNOSIS — D649 Anemia, unspecified: Secondary | ICD-10-CM

## 2022-07-26 DIAGNOSIS — E7849 Other hyperlipidemia: Secondary | ICD-10-CM

## 2022-07-26 DIAGNOSIS — Z0000 Encounter for general adult medical examination without abnormal findings: Secondary | ICD-10-CM

## 2022-07-26 DIAGNOSIS — R7303 Prediabetes: Secondary | ICD-10-CM

## 2022-07-26 DIAGNOSIS — E559 Vitamin D deficiency, unspecified: Secondary | ICD-10-CM

## 2022-07-26 NOTE — Telephone Encounter (Signed)
-----   Message from Velna Hatchet, RT sent at 07/12/2022  8:13 AM EST ----- Regarding: Thu 3/14 lab Patient is scheduled for cpx, please order future labs.  Thanks, Anda Kraft

## 2022-07-28 ENCOUNTER — Other Ambulatory Visit (INDEPENDENT_AMBULATORY_CARE_PROVIDER_SITE_OTHER): Payer: No Typology Code available for payment source

## 2022-07-28 DIAGNOSIS — E559 Vitamin D deficiency, unspecified: Secondary | ICD-10-CM

## 2022-07-28 DIAGNOSIS — E7849 Other hyperlipidemia: Secondary | ICD-10-CM | POA: Diagnosis not present

## 2022-07-28 DIAGNOSIS — Z0000 Encounter for general adult medical examination without abnormal findings: Secondary | ICD-10-CM

## 2022-07-28 DIAGNOSIS — R7303 Prediabetes: Secondary | ICD-10-CM | POA: Diagnosis not present

## 2022-07-29 LAB — COMPREHENSIVE METABOLIC PANEL
ALT: 14 U/L (ref 0–35)
AST: 16 U/L (ref 0–37)
Albumin: 4.3 g/dL (ref 3.5–5.2)
Alkaline Phosphatase: 77 U/L (ref 39–117)
BUN: 11 mg/dL (ref 6–23)
CO2: 26 mEq/L (ref 19–32)
Calcium: 9.5 mg/dL (ref 8.4–10.5)
Chloride: 102 mEq/L (ref 96–112)
Creatinine, Ser: 0.76 mg/dL (ref 0.40–1.20)
GFR: 84.59 mL/min (ref >60.00)
Glucose, Bld: 79 mg/dL (ref 70–99)
Potassium: 4.2 mEq/L (ref 3.5–5.1)
Sodium: 137 mEq/L (ref 135–145)
Total Bilirubin: 0.5 mg/dL (ref 0.2–1.2)
Total Protein: 6.9 g/dL (ref 6.0–8.3)

## 2022-07-29 LAB — CBC WITH DIFFERENTIAL/PLATELET
Basophils Absolute: 0.1 10*3/uL (ref 0.0–0.1)
Basophils Relative: 0.8 % (ref 0.0–3.0)
Eosinophils Absolute: 0 10*3/uL (ref 0.0–0.7)
Eosinophils Relative: 0.7 % (ref 0.0–5.0)
HCT: 41.9 % (ref 36.0–46.0)
Hemoglobin: 13.9 g/dL (ref 12.0–15.0)
Lymphocytes Relative: 30.8 % (ref 12.0–46.0)
Lymphs Abs: 2.2 10*3/uL (ref 0.7–4.0)
MCHC: 33.3 g/dL (ref 30.0–36.0)
MCV: 81.5 fl (ref 78.0–100.0)
Monocytes Absolute: 0.4 10*3/uL (ref 0.1–1.0)
Monocytes Relative: 5.2 % (ref 3.0–12.0)
Neutro Abs: 4.5 10*3/uL (ref 1.4–7.7)
Neutrophils Relative %: 62.5 % (ref 43.0–77.0)
Platelets: 304 10*3/uL (ref 150.0–400.0)
RBC: 5.14 Mil/uL — ABNORMAL HIGH (ref 3.87–5.11)
RDW: 12.9 % (ref 11.5–15.5)
WBC: 7.1 10*3/uL (ref 4.0–10.5)

## 2022-07-29 LAB — LIPID PANEL
Cholesterol: 185 mg/dL (ref 0–200)
HDL: 42.6 mg/dL (ref >39.00)
LDL Cholesterol: 128 mg/dL — ABNORMAL HIGH (ref 0–99)
NonHDL: 142.52
Total CHOL/HDL Ratio: 4
Triglycerides: 72 mg/dL (ref 0.0–149.0)
VLDL: 14.4 mg/dL (ref 0.0–40.0)

## 2022-07-29 LAB — VITAMIN D 25 HYDROXY (VIT D DEFICIENCY, FRACTURES): VITD: 25.95 ng/mL — ABNORMAL LOW (ref 30.00–100.00)

## 2022-07-29 LAB — TSH: TSH: 1.03 u[IU]/mL (ref 0.35–5.50)

## 2022-07-29 LAB — HEMOGLOBIN A1C: Hgb A1c MFr Bld: 6 % (ref 4.6–6.5)

## 2022-08-01 ENCOUNTER — Ambulatory Visit (INDEPENDENT_AMBULATORY_CARE_PROVIDER_SITE_OTHER): Payer: No Typology Code available for payment source | Admitting: Family Medicine

## 2022-08-01 ENCOUNTER — Other Ambulatory Visit: Payer: Self-pay | Admitting: Family Medicine

## 2022-08-01 ENCOUNTER — Encounter: Payer: Self-pay | Admitting: Family Medicine

## 2022-08-01 VITALS — BP 114/72 | HR 68 | Temp 97.4°F | Ht 62.5 in | Wt 200.5 lb

## 2022-08-01 DIAGNOSIS — E7849 Other hyperlipidemia: Secondary | ICD-10-CM

## 2022-08-01 DIAGNOSIS — Z1231 Encounter for screening mammogram for malignant neoplasm of breast: Secondary | ICD-10-CM

## 2022-08-01 DIAGNOSIS — E559 Vitamin D deficiency, unspecified: Secondary | ICD-10-CM | POA: Diagnosis not present

## 2022-08-01 DIAGNOSIS — F418 Other specified anxiety disorders: Secondary | ICD-10-CM | POA: Insufficient documentation

## 2022-08-01 DIAGNOSIS — R7303 Prediabetes: Secondary | ICD-10-CM

## 2022-08-01 DIAGNOSIS — Z6836 Body mass index (BMI) 36.0-36.9, adult: Secondary | ICD-10-CM

## 2022-08-01 DIAGNOSIS — R079 Chest pain, unspecified: Secondary | ICD-10-CM

## 2022-08-01 DIAGNOSIS — L989 Disorder of the skin and subcutaneous tissue, unspecified: Secondary | ICD-10-CM

## 2022-08-01 DIAGNOSIS — Z0001 Encounter for general adult medical examination with abnormal findings: Secondary | ICD-10-CM | POA: Diagnosis not present

## 2022-08-01 MED ORDER — FAMOTIDINE 20 MG PO TABS: 20.0000 mg | ORAL_TABLET | Freq: Two times a day (BID) | ORAL | 1 refills | Status: DC

## 2022-08-01 NOTE — Assessment & Plan Note (Signed)
Level of 25.9  Enc her to add another 2000 iu D3 daily  Already takes mvi  Stressed imp to bone and overall health

## 2022-08-01 NOTE — Assessment & Plan Note (Signed)
This occurs in lower mid chest and refers to back  Some reflux also  Px pepcid 20 mg bid  F/u 1-2 wk-will plan EKG and cxr and further eval if needed based on history as well

## 2022-08-01 NOTE — Assessment & Plan Note (Signed)
Mild and stable Disc goals for lipids and reasons to control them Rev last labs with pt Rev low sat fat diet in detail LDL of 128   Enc better diet/exercise Disc further at f/u  Has fam hx of heart dz

## 2022-08-01 NOTE — Assessment & Plan Note (Signed)
1 cm area of pink scale on upper L breast  Resembles dermatitis  Otherwise nl exam  Inst to try otc cortisone cream Re check at f/u in 1-2 wk

## 2022-08-01 NOTE — Assessment & Plan Note (Signed)
Reviewed health habits including diet and exercise and skin cancer prevention Reviewed appropriate screening tests for age  Also reviewed health mt list, fam hx and immunization status , as well as social and family history   See HPI Labs reviewed Declines shingrix and flu shot  Mammogram ordered  Pap utd 07/2021 PHQ:0  New travel anxiety- referred to counseling  New chest discomfort- px pepcid and plan f/u in 1-2 wk with ER precautions New skin spot on breast- will f/u for re check in 1-2 weeks also

## 2022-08-01 NOTE — Progress Notes (Signed)
Subjective:    Patient ID: Victoria [name], female    DOB: 1960/08/11, 62 y.o.   MRN: [id number]  HPI Here for health maintenance exam and to review chronic medical problems   Wt Readings from Last 3 Encounters:  08/01/22 200 lb 8 oz (90.9 kg)  10/25/21 202 lb 3.2 oz (91.7 kg)  09/13/21 201 lb (91.2 kg)   36.09 kg/m  Vitals:   08/01/22 1021  BP: 114/72  Pulse: 68  Temp: (!) 97.4 F (36.3 C)  SpO2: 98%   Taking care of herself   Some anxiety with travel  Has some issues with pain in mid chest-rad to her back  Daily  Mid day  Sometimes if feels like something gets stuck in esophagus    Immunization History  Administered Date(s) Administered   Influenza Split 06/08/2011   Moderna Sars-Covid-2 Vaccination 09/28/2019, 10/26/2019, 05/23/2020, 11/26/2020, 07/24/2021   Td 11/22/2004   Tdap 09/02/2014   Health Maintenance Due  Topic Date Due   Hepatitis C Screening  Never done   Zoster Vaccines- Shingrix (1 of 2) Never done   INFLUENZA VACCINE  12/14/2021   COVID-19 Vaccine (6 - 2023-24 season) 01/14/2022    Shingrix: declines   Flu shot - declines   [facility] - just got the reminder  Self breast exam- no lumps Has a little red spot- on L breast  Does not feel like a pimple   Pap 07/2021  nl with neg HPV screen  No symptoms or new partner   Colonoscopy 09/2021  utd   Mood:  PHQ :0   She is having some trouble with anxiety- highway driveway and air plane  Is afraid to fly (would need an ativan)  Heart races , sweats  Limits her visiting family    This is brand new This did happen after she had her ccy  No accidents No bad experiences ?       08/01/2022   11:12 AM 10/25/2021   10:27 AM 07/27/2021    3:28 PM 07/23/2020    9:13 AM 06/15/2020   10:55 AM  Depression screen PHQ 2/9  Decreased Interest 0 0 0 0 0  Down, Depressed, Hopeless 0 0 0 0 0  PHQ - 2 Score 0 0 0 0 0  Altered sleeping 0   0   Tired, decreased energy 0    1   Change in appetite 0   1   Feeling bad or failure about yourself  0   0   Trouble concentrating 0   0   Moving slowly or fidgety/restless 0   0   Suicidal thoughts 0   0   PHQ-9 Score 0   2   Difficult doing work/chores Not difficult at all   Not difficult at all       Daughter moved to [place] -this is difficult   Also gets anxious with MRI - took a lorazepam and it helped     [facility]   Vit D def- low level of 25.9  Bought some vitamin D - has not started yet  Takes mvi    H/o iron def Lab Results  Component Value Date   WBC 7.1 07/28/2022   HGB 13.9 07/28/2022   HCT 41.9 07/28/2022   MCV 81.5 07/28/2022   PLT 304.0 07/28/2022    Hyperlipidemia Lab Results  Component Value Date   CHOL 185 07/28/2022   CHOL 199  10/25/2021   CHOL 203 (H) 07/23/2020   Lab Results  Component Value Date   HDL 42.60 07/28/2022   HDL 45.90 10/25/2021   HDL 44 07/23/2020   Lab Results  Component Value Date   LDLCALC 128 (H) 07/28/2022   LDLCALC 131 (H) 10/25/2021   LDLCALC 138 (H) 07/23/2020   Lab Results  Component Value Date   TRIG 72.0 07/28/2022   TRIG 111.0 10/25/2021   TRIG 114 07/23/2020   Lab Results  Component Value Date   CHOLHDL 4 07/28/2022   CHOLHDL 4 10/25/2021   CHOLHDL 4.6 (H) 07/23/2020   No results found for: "LDLDIRECT"  Exercise- walking in good weather  30 min at least   She bakes food instead of frying  Red meat once per week maximum   No ice cream Occ cheese    The 10-year ASCVD risk score (Arnett DK, et al., 2019) is: 4.4%   Values used to calculate the score:     Age: 73 years     Sex: Female     Is Non-Hispanic African American: Yes     Diabetic: No     Tobacco smoker: No     Systolic Blood Pressure: 99991111 mmHg     Is BP treated: No     HDL Cholesterol: 42.6 mg/dL     Total Cholesterol: 185 mg/dL   Prediabetes Lab Results  Component Value Date   HGBA1C 6.0 07/28/2022   5.7 at [facility] in the past   Was off the wagon with sugar  Now better   Lab Results  Component Value Date   CREATININE 0.76 07/28/2022   BUN 11 07/28/2022   NA 137 07/28/2022   K 4.2 07/28/2022   CL 102 07/28/2022   CO2 26 07/28/2022   Lab Results  Component Value Date   ALT 14 07/28/2022   AST 16 07/28/2022   ALKPHOS 77 07/28/2022   BILITOT 0.5 07/28/2022    Lab Results  Component Value Date   TSH 1.03 07/28/2022       Patient Active Problem List   Diagnosis Date Noted   Situational anxiety 08/01/2022   Skin lesion 08/01/2022   Encounter for screening mammogram for breast cancer 07/27/2021   Herpes simplex 07/27/2021   Vitamin D deficiency 08/06/2020   At risk for osteoporosis 08/06/2020   History of iron deficiency 08/06/2020   Back pain 07/23/2020   SOBOE (shortness of breath on exertion) 07/23/2020   Anemia 07/23/2020   At risk for heart disease 07/23/2020   H/O small bowel obstruction 08/28/2018   Prediabetes 06/01/2018   Panic attacks 02/22/2016   Encounter for routine gynecological examination 09/02/2014   Chest pain 11/20/2011   Special screening for malignant neoplasms, colon 06/08/2011   Hyperlipidemia 06/08/2011   Encounter for general adult medical examination with abnormal findings 06/01/2011   LIPOMA 12/30/2009   FIBROIDS, UTERUS 04/27/2009   Obesity 03/19/2007   ALLERGIC RHINITIS 02/27/2007   Past Medical History:  Diagnosis Date   Allergic rhinitis    Anemia    Anemia, iron deficiency    Back pain    Gallbladder problem    Heavy menses    Hyperglycemia 02/22/2016   Lipoma    on L arm   SOBOE (shortness of breath on exertion)    Uterine fibroid    Past Surgical History:  Procedure Laterality Date   CESAREAN SECTION     CHOLECYSTECTOMY     11/07/11   ENDOMETRIAL ABLATION  fibroids   Pelvic U/S and transvaginal  04/27/2009   fibroids x 2   TONSILLECTOMY     TUBAL LIGATION     Uterine ultrasound  06/1998   and endo biopsy- neg   Social History    Tobacco Use   Smoking status: Never   Smokeless tobacco: Never  Vaping Use   Vaping Use: Never used  Substance Use Topics   Alcohol use: No    Alcohol/week: 0.0 standard drinks of alcohol   Drug use: No   Family History  Problem Relation Age of Onset   Diabetes Mother    Hypertension Mother    Hyperlipidemia Mother    Cancer Mother        uterine cancer   Lung cancer Mother        died 40   Diabetes Father    Alcohol abuse Father    Hypertension Father    Heart disease Father    Sudden death Father    Alcoholism Father    Alcohol abuse Sister    Hypertension Sister    Diabetes Sister        boarderline   Diabetes Sister        severe   Alcohol abuse Brother    Hypertension Brother    Alcohol abuse Brother    Alcohol abuse Brother    Alcohol abuse Brother    Diabetes Maternal Grandmother    Alcohol abuse Paternal Grandfather    Colon cancer Neg Hx    Breast cancer Neg Hx    Colon polyps Neg Hx    Esophageal cancer Neg Hx    Stomach cancer Neg Hx    Rectal cancer Neg Hx    Allergies  Allergen Reactions   Codeine Other (See Comments) and Nausea And Vomiting    REACTION: nausea and vomiting REACTION: nausea and vomiting    Doxycycline Nausea Only   Current Outpatient Medications on File Prior to Visit  Medication Sig Dispense Refill   Multiple Vitamin (MULTIVITAMIN) capsule Take 1 capsule by mouth daily.     No current facility-administered medications on file prior to visit.      Review of Systems  Constitutional:  Positive for fatigue. Negative for activity change, appetite change, fever and unexpected weight change.  HENT:  Negative for congestion, ear pain, rhinorrhea, sinus pressure and sore throat.   Eyes:  Negative for pain, redness and visual disturbance.  Respiratory:  Negative for cough, shortness of breath and wheezing.   Cardiovascular:  Positive for chest pain. Negative for palpitations and leg swelling.  Gastrointestinal:  Negative  for abdominal pain, blood in stool, constipation and diarrhea.       Acid reflux  Endocrine: Negative for polydipsia and polyuria.  Genitourinary:  Negative for dysuria, frequency and urgency.  Musculoskeletal:  Negative for arthralgias, back pain and myalgias.  Skin:  Negative for pallor and rash.       Skin spot  Allergic/Immunologic: Negative for environmental allergies.  Neurological:  Negative for dizziness, syncope and headaches.  Hematological:  Negative for adenopathy. Does not bruise/bleed easily.  Psychiatric/Behavioral:  Negative for decreased concentration and dysphoric mood. The patient is nervous/anxious.        Objective:   Physical Exam Constitutional:      General: She is not in acute distress.    Appearance: Normal appearance. She is well-developed. She is obese. She is not ill-appearing or diaphoretic.  HENT:     Head: Normocephalic and atraumatic.  Right Ear: Tympanic membrane, ear canal and external ear normal.     Left Ear: Tympanic membrane, ear canal and external ear normal.     Nose: Nose normal. No congestion.     Mouth/Throat:     Mouth: Mucous membranes are moist.     Pharynx: Oropharynx is clear. No posterior oropharyngeal erythema.  Eyes:     General: No scleral icterus.    Extraocular Movements: Extraocular movements intact.     Conjunctiva/sclera: Conjunctivae normal.     Pupils: Pupils are equal, round, and reactive to light.  Neck:     Thyroid: No thyromegaly.     Vascular: No carotid bruit or JVD.  Cardiovascular:     Rate and Rhythm: Normal rate and regular rhythm.     Pulses: Normal pulses.     Heart sounds: Normal heart sounds.     No gallop.  Pulmonary:     Effort: Pulmonary effort is normal. No respiratory distress.     Breath sounds: Normal breath sounds. No wheezing.     Comments: Good air exch Chest:     Chest wall: No tenderness.  Abdominal:     General: Bowel sounds are normal. There is no distension or abdominal bruit.      Palpations: Abdomen is soft. There is no mass.     Tenderness: There is no abdominal tenderness.     Hernia: No hernia is present.  Genitourinary:    Comments: Breast exam: No mass, nodules, thickening, tenderness, bulging, retraction, inflamation, nipple discharge .  No axillary or clavicular LA.      1 cm area of scale on upper L breast noted  Slt pink in color Musculoskeletal:        General: No tenderness. Normal range of motion.     Cervical back: Normal range of motion and neck supple. No rigidity. No muscular tenderness.     Right lower leg: No edema.     Left lower leg: No edema.     Comments: No kyphosis   Lymphadenopathy:     Cervical: No cervical adenopathy.  Skin:    General: Skin is warm and dry.     Coloration: Skin is not pale.     Findings: No erythema or rash.     Comments: Solar lentigines diffusely  See breast exam  Neurological:     Mental Status: She is alert. Mental status is at baseline.     Cranial Nerves: No cranial nerve deficit.     Motor: No abnormal muscle tone.     Coordination: Coordination normal.     Gait: Gait normal.     Deep Tendon Reflexes: Reflexes are normal and symmetric. Reflexes normal.  Psychiatric:        Attention and Perception: Attention normal.        Mood and Affect: Mood normal.        Cognition and Memory: Cognition and memory normal.           Assessment & Plan:   Problem List Items Addressed This Visit       Musculoskeletal and Integument   Skin lesion    1 cm area of pink scale on upper L breast  Resembles dermatitis  Otherwise nl exam  Inst to try otc cortisone cream Re check at f/u in 1-2 wk        Other   Chest pain    This occurs in lower mid chest and refers to back  Some reflux also  Px pepcid 20 mg bid  F/u 1-2 wk-will plan EKG and cxr and further eval if needed based on history as well        Encounter for general adult medical examination with abnormal findings - Primary    Reviewed  health habits including diet and exercise and skin cancer prevention Reviewed appropriate screening tests for age  Also reviewed health mt list, fam hx and immunization status , as well as social and family history   See HPI Labs reviewed Declines shingrix and flu shot  Mammogram ordered  Pap utd 07/2021 PHQ:0  New travel anxiety- referred to counseling  New chest discomfort- px pepcid and plan f/u in 1-2 wk with ER precautions New skin spot on breast- will f/u for re check in 1-2 weeks also      Encounter for screening mammogram for breast cancer   Hyperlipidemia    Mild and stable Disc goals for lipids and reasons to control them Rev last labs with pt Rev low sat fat diet in detail LDL of 128   Enc better diet/exercise Disc further at f/u  Has fam hx of heart dz       Obesity   Prediabetes   Situational anxiety    Pt has developed travel anxiety  Both flying and driving on highway This has limited her activities  In future- can refill lorazepam for flying   Ref made to counseling for this as well        Relevant Orders   Ambulatory referral to Psychology   Vitamin D deficiency    Level of 25.9  Enc her to add another 2000 iu D3 daily  Already takes mvi  Stressed imp to bone and overall health

## 2022-08-01 NOTE — Patient Instructions (Addendum)
I want you to take vitamin D3 2000 iu every day from now on   I put a referral in for some counseling to discuss your traveling fears  Please let [name] know if you don't hear in 1-2 weeks   If you need px for anxiety medicine for travel just let [name] know   Schedule your mammogram at the breast center  Use some over the counter cortisone cream on the red spot on your chest  I will check it when you come back     Aim for at least 30 minutes of exercise per day  Strength training is important in addition to cardio  Inside or out  Light weights/ exercise bands/ machines Chair yoga  Lots of videos on line   For cholesterol  Avoid red meat/ fried foods/ egg yolks/ fatty breakfast meats/ butter, cheese and high fat dairy/ and shellfish   To prevent diabetes Try to get most of your carbohydrates from produce (with the exception of white potatoes)  Eat less bread/pasta/rice/snack foods/cereals/sweets and other items from the middle of the grocery store (processed carbs)  Take pepcid 20 mg twice daily  See if this helps the chest symptoms   Follow up in the next 1-2 weeks to discuss chest symptoms  If symptoms get severe in the meantime go to ER

## 2022-08-01 NOTE — Assessment & Plan Note (Signed)
Pt has developed travel anxiety  Both flying and driving on highway This has limited her activities  In future- can refill lorazepam for flying   Ref made to counseling for this as well

## 2022-08-02 NOTE — Telephone Encounter (Signed)
Please let pt know I had to change her med to nizatidine  It should work just as well

## 2022-08-02 NOTE — Telephone Encounter (Signed)
See note on med regarding pepcid Rx  : Alternative Requested:THE PRESCRIBED MEDICATION IS NOT COVERED BY INSURANCE. PLEASE CONSIDER CHANGING TO ONE OF THE SUGGESTED COVERED ALTERNATIVES.   All Pharmacy Suggested Alternatives:  cimetidine (TAGAMET) 400 MG tablet nizatidine (AXID) 150 MG capsule

## 2022-08-03 MED ORDER — NIZATIDINE 150 MG PO CAPS: 150.0000 mg | ORAL_CAPSULE | Freq: Two times a day (BID) | ORAL | 1 refills | Status: DC

## 2022-08-03 NOTE — Telephone Encounter (Signed)
Pt.notified

## 2022-08-08 ENCOUNTER — Encounter: Payer: Self-pay | Admitting: Family Medicine

## 2022-08-08 ENCOUNTER — Telehealth: Payer: Self-pay | Admitting: Family Medicine

## 2022-08-08 ENCOUNTER — Ambulatory Visit (INDEPENDENT_AMBULATORY_CARE_PROVIDER_SITE_OTHER)
Admission: RE | Admit: 2022-08-08 | Discharge: 2022-08-08 | Disposition: A | Discharge status: Home / Self Care | Payer: No Typology Code available for payment source | Source: Ambulatory Visit | Attending: Family Medicine | Admitting: Family Medicine

## 2022-08-08 ENCOUNTER — Ambulatory Visit: Payer: No Typology Code available for payment source | Admitting: Family Medicine

## 2022-08-08 VITALS — BP 124/76 | HR 70 | Temp 97.2°F | Ht 62.5 in | Wt 197.0 lb

## 2022-08-08 DIAGNOSIS — R10816 Epigastric abdominal tenderness: Secondary | ICD-10-CM | POA: Insufficient documentation

## 2022-08-08 DIAGNOSIS — L989 Disorder of the skin and subcutaneous tissue, unspecified: Secondary | ICD-10-CM | POA: Diagnosis not present

## 2022-08-08 DIAGNOSIS — R079 Chest pain, unspecified: Secondary | ICD-10-CM | POA: Diagnosis not present

## 2022-08-08 MED ORDER — TRIAMCINOLONE ACETONIDE 0.1 % EX CREA: 1.0000 | TOPICAL_CREAM | Freq: Two times a day (BID) | CUTANEOUS | 0 refills | Status: DC

## 2022-08-08 NOTE — Telephone Encounter (Signed)
Thanks for the heads up= if pt is open to [place] would go for April 4 Thanks Let me know if I need to place the referral again

## 2022-08-08 NOTE — Telephone Encounter (Signed)
[facility] called to let Dr Glori Bickers know that they do not have any availability in the [place] office until [place] and the [place] office until April 4, for the stat abdomen ultrasound.

## 2022-08-08 NOTE — Addendum Note (Signed)
Addended by: Loura Pardon A on: 08/08/2022 09:32 PM   Modules accepted: Orders

## 2022-08-08 NOTE — Progress Notes (Addendum)
Subjective:    Patient ID: Victoria [name], female    DOB: 05/26/1960, 62 y.o.   MRN: [id number]  HPI Pt presents to discuss chest symptoms   Re check spot on chest    Wt Readings from Last 3 Encounters:  08/08/22 197 lb (89.4 kg)  08/01/22 200 lb 8 oz (90.9 kg)  10/25/21 202 lb 3.2 oz (91.7 kg)   35.46 kg/m Vitals:   08/08/22 1058  BP: 124/76  Pulse: 70  Temp: (!) 97.2 F (36.2 C)  SpO2: 98%     Last visit discussed lower /mid chest discomfort Ref to the back  Some acid reflux  Tried pepcid 20 mg bid (it was changed to axid for insurance cov) Helped acid but not chest symptoms   Last night- felt like something was in her chest (when she lay on either side) Then chest hurt a bit   Pressure sensation  Happens 3-4 hours after standing  Happens about 15 minutes into an exericse walk  Occ dull No tingling  Not sharp   At first = was lying down  Mid chest All the way to back   No sob  No pleuritic pain or discomfort   Never smoked A little 2nd hand smoke growing up    Fam hx Father- died /sudden cardiac death 80 yo Mother had lung cancer     Pink spot on chest / upper L breast  Inst to try otc cortisone cream  No change in spot  Does not itch  EKG today : NSR with rate of 61  CXR today  DG Chest 2 View  Result Date: 08/08/2022 CLINICAL DATA:  Chest pain worse when lying down. EXAM: CHEST - 2 VIEW COMPARISON:  None Available. FINDINGS: The heart size and mediastinal contours are within normal limits. Both lungs are clear. The visualized skeletal structures are unremarkable. IMPRESSION: No active cardiopulmonary disease. Electronically Signed   By: Marin Olp M.D.   On: 08/08/2022 12:09    Patient Active Problem List   Diagnosis Date Noted   Situational anxiety 08/01/2022   Skin lesion 08/01/2022   Encounter for screening mammogram for breast cancer 07/27/2021   Herpes simplex 07/27/2021   Vitamin D deficiency 08/06/2020   At risk for  osteoporosis 08/06/2020   History of iron deficiency 08/06/2020   Back pain 07/23/2020   SOBOE (shortness of breath on exertion) 07/23/2020   Anemia 07/23/2020   At risk for heart disease 07/23/2020   H/O small bowel obstruction 08/28/2018   Prediabetes 06/01/2018   Panic attacks 02/22/2016   Encounter for routine gynecological examination 09/02/2014   Chest pain 11/20/2011   Special screening for malignant neoplasms, colon 06/08/2011   Hyperlipidemia 06/08/2011   Encounter for general adult medical examination with abnormal findings 06/01/2011   LIPOMA 12/30/2009   FIBROIDS, UTERUS 04/27/2009   Obesity 03/19/2007   ALLERGIC RHINITIS 02/27/2007   Past Medical History:  Diagnosis Date   Allergic rhinitis    Anemia    Anemia, iron deficiency    Back pain    Gallbladder problem    Heavy menses    Hyperglycemia 02/22/2016   Lipoma    on L arm   SOBOE (shortness of breath on exertion)    Uterine fibroid    Past Surgical History:  Procedure Laterality Date   CESAREAN SECTION     CHOLECYSTECTOMY     11/07/11   ENDOMETRIAL ABLATION     fibroids   Pelvic U/S and transvaginal  04/27/2009   fibroids x 2   TONSILLECTOMY     TUBAL LIGATION     Uterine ultrasound  06/1998   and endo biopsy- neg   Social History   Tobacco Use   Smoking status: Never   Smokeless tobacco: Never  Vaping Use   Vaping Use: Never used  Substance Use Topics   Alcohol use: No    Alcohol/week: 0.0 standard drinks of alcohol   Drug use: No   Family History  Problem Relation Age of Onset   Diabetes Mother    Hypertension Mother    Hyperlipidemia Mother    Cancer Mother        uterine cancer   Lung cancer Mother        died 18   Diabetes Father    Alcohol abuse Father    Hypertension Father    Heart disease Father    Sudden death Father    Alcoholism Father    Alcohol abuse Sister    Hypertension Sister    Diabetes Sister        boarderline   Diabetes Sister        severe    Alcohol abuse Brother    Hypertension Brother    Alcohol abuse Brother    Alcohol abuse Brother    Alcohol abuse Brother    Diabetes Maternal Grandmother    Alcohol abuse Paternal Grandfather    Colon cancer Neg Hx    Breast cancer Neg Hx    Colon polyps Neg Hx    Esophageal cancer Neg Hx    Stomach cancer Neg Hx    Rectal cancer Neg Hx    Allergies  Allergen Reactions   Codeine Other (See Comments) and Nausea And Vomiting    REACTION: nausea and vomiting REACTION: nausea and vomiting    Doxycycline Nausea Only   Current Outpatient Medications on File Prior to Visit  Medication Sig Dispense Refill   Multiple Vitamin (MULTIVITAMIN) capsule Take 1 capsule by mouth daily.     nizatidine (AXID) 150 MG capsule Take 1 capsule (150 mg total) by mouth 2 (two) times daily. 60 capsule 1   No current facility-administered medications on file prior to visit.      Review of Systems  Respiratory:  Negative for apnea, cough, choking, chest tightness, shortness of breath and stridor.   Cardiovascular:  Positive for chest pain. Negative for palpitations and leg swelling.  Gastrointestinal:  Negative for abdominal distention, blood in stool, nausea and vomiting.  Neurological:  Negative for tremors and syncope.       Objective:   Physical Exam Constitutional:      General: She is not in acute distress.    Appearance: Normal appearance. She is well-developed. She is obese. She is not ill-appearing or diaphoretic.  HENT:     Head: Normocephalic and atraumatic.  Eyes:     Conjunctiva/sclera: Conjunctivae normal.     Pupils: Pupils are equal, round, and reactive to light.  Neck:     Thyroid: No thyromegaly.     Vascular: No carotid bruit or JVD.  Cardiovascular:     Rate and Rhythm: Normal rate and regular rhythm.     Heart sounds: Normal heart sounds.     No gallop.  Pulmonary:     Effort: Pulmonary effort is normal. No respiratory distress.     Breath sounds: Normal breath  sounds. No stridor. No wheezing, rhonchi or rales.  Chest:     Chest wall: No tenderness.  Abdominal:     General: Abdomen is protuberant. There is no distension or abdominal bruit.     Palpations: Abdomen is soft. There is no shifting dullness, hepatomegaly, splenomegaly or pulsatile mass.     Tenderness: There is abdominal tenderness in the epigastric area. There is no right CVA tenderness, left CVA tenderness, guarding or rebound.     Hernia: No hernia is present.  Musculoskeletal:     Cervical back: Normal range of motion and neck supple.     Right lower leg: No edema.     Left lower leg: No edema.  Lymphadenopathy:     Cervical: No cervical adenopathy.  Skin:    General: Skin is warm and dry.     Coloration: Skin is not jaundiced or pale.     Findings: No bruising, erythema or rash.     Comments: 1 cm area of pink scale on L breast No change from last time   Neurological:     Mental Status: She is alert.     Coordination: Coordination normal.     Deep Tendon Reflexes: Reflexes are normal and symmetric. Reflexes normal.  Psychiatric:        Mood and Affect: Mood normal.           Assessment & Plan:   Problem List Items Addressed This Visit       Musculoskeletal and Integument   Skin lesion    Small pink area of scale on L breast  Unchanged with otc cortisone Px triamcinolone cream today   Inst to update if not gone after bid use for 2 weeks Earlier if it gets larger or changes   She is due for mammogram next month         Other   Chest pain - Primary    Happens some with prolonged standing or walking /other times at night in bed when lying on her side  Intermittent  Nl EKG Nl cxr   Some epigastric tenderness (is taking axid for acid reflux but it did not help cp)   Reviewed findings  Would like to get abd [name] to vis aorta  Consider cardiology ref ER precaution snoted      Relevant Orders   EKG 12-Lead (Completed)   DG Chest 2 View (Completed)    Epigastric abdominal tenderness    Mild/noted on today's visi Also some chest complaints Some imp with pepcid (for heartburn)   Abd [name] ordered

## 2022-08-08 NOTE — Assessment & Plan Note (Signed)
Small pink area of scale on L breast  Unchanged with otc cortisone Px triamcinolone cream today   Inst to update if not gone after bid use for 2 weeks Earlier if it gets larger or changes   She is due for mammogram next month

## 2022-08-08 NOTE — Patient Instructions (Addendum)
Your EKG looks good  Let's check a chest xray next  We will make a plan from there   Ust the triamdinolone cream on the skin spot  Let me know if the spot is not gone in 2 weeks  Also if itches or gets larger

## 2022-08-08 NOTE — Assessment & Plan Note (Signed)
Mild/noted on today's visi Also some chest complaints Some imp with pepcid (for heartburn)   Abd [name] ordered

## 2022-08-08 NOTE — Assessment & Plan Note (Addendum)
Happens some with prolonged standing or walking /other times at night in bed when lying on her side  Intermittent  Nl EKG Nl cxr   Some epigastric tenderness (is taking axid for acid reflux but it did not help cp)   Reviewed findings  Would like to get abd [name] to vis aorta  Consider cardiology ref ER precaution snoted

## 2022-08-09 ENCOUNTER — Ambulatory Visit (HOSPITAL_BASED_OUTPATIENT_CLINIC_OR_DEPARTMENT_OTHER)
Admission: RE | Admit: 2022-08-09 | Discharge: 2022-08-09 | Disposition: A | Discharge status: Home / Self Care | Payer: No Typology Code available for payment source | Source: Ambulatory Visit | Attending: Family Medicine | Admitting: Family Medicine

## 2022-08-09 DIAGNOSIS — R079 Chest pain, unspecified: Secondary | ICD-10-CM | POA: Insufficient documentation

## 2022-08-09 DIAGNOSIS — R10816 Epigastric abdominal tenderness: Secondary | ICD-10-CM | POA: Diagnosis present

## 2022-08-31 ENCOUNTER — Ambulatory Visit: Payer: No Typology Code available for payment source | Admitting: Clinical

## 2022-08-31 DIAGNOSIS — F4000 Agoraphobia, unspecified: Secondary | ICD-10-CM | POA: Diagnosis not present

## 2022-08-31 NOTE — Progress Notes (Signed)
                Julian Medina, LCSW 
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
## 2022-09-12 LAB — HM MAMMOGRAPHY

## 2022-10-11 ENCOUNTER — Telehealth: Payer: Self-pay | Admitting: Family Medicine

## 2022-10-11 ENCOUNTER — Ambulatory Visit (INDEPENDENT_AMBULATORY_CARE_PROVIDER_SITE_OTHER): Payer: No Typology Code available for payment source | Admitting: Clinical

## 2022-10-11 DIAGNOSIS — F4000 Agoraphobia, unspecified: Secondary | ICD-10-CM | POA: Diagnosis not present

## 2022-10-11 NOTE — Telephone Encounter (Signed)
Pt came in office stated she is going out of town and would like to get something call in for anxiety Please advise #[phone]

## 2022-10-11 NOTE — Telephone Encounter (Signed)
I am out ot the office this week , thanks for letting me know  What kind of anxiety symptoms is she having or anticipating?   Is she talking about something just for an airplane ride or something daily? Thanks

## 2022-10-11 NOTE — Progress Notes (Signed)
   [facility] Counselor/Therapist Progress Note  Patient ID: ERMAGENE LANA, MRN: [id number]    Date: 10/11/22  Time Spent: 1:31  pm - 2:19 pm : 48 Minutes  Treatment Type: Individual Therapy.  Reported Symptoms: Patient reported fear, panic when traveling or in crowds  Mental Status Exam: Appearance:  Well Groomed     Behavior: Appropriate  Motor: Normal  Speech/Language:  Clear and Coherent  Affect: Appropriate  Mood: normal  Thought process: normal  Thought content:   WNL  Sensory/Perceptual disturbances:   WNL  Orientation: oriented to person, place, and situation  Attention: Good  Concentration: Good  Memory: WNL  Fund of knowledge:  Good  Insight:   Good  Judgment:  Good  Impulse Control: Good   Risk Assessment: Danger to Self:  No Patient denied current suicidal ideation  Self-injurious Behavior: No Danger to Others: No Patient denied current homicidal ideation Duty to Warn:no Physical Aggression / Violence:No  Access to Firearms a concern: No  Gang Involvement:No   Subjective:  Patient stated, "I'm fine" in response to mood. Patient reported no changes in symptoms since last session. Patient reported she is planning a trip to [place] to watch her brother receive an award and stated, "not good" in response patient's feelings regarding the trip. Patient stated, "I feel a panic when I think about it" in regards to the upcoming trip. Patient inquired about medication for treatment of anxiety in preparation for patient's upcoming trip. Patient stated, "good" in response to patient's current mood. Patient reported she is open to participation in therapy. Patient stated, "someway of being able to control my anxiety when I get in those situations" in response to goals for therapy.   Interventions: Motivational Interviewing. Clinician conducted session in person at clinician's office at [facility].Clinician reviewed diagnosis and treatment  recommendations. Provided psycho education related to diagnosis and treatment. Provided psycho education related to psychotropic medications and discussed patient following up with her primary care provider to discuss patient's inquiry regarding medication. Clinician utilized motivational interviewing to explore potential goals for therapy. Clinician utilized a task centered approach in collaboration with patient to begin to develop goals for therapy.   Collaboration of Care: Other not required at this time   Diagnosis:  Agoraphobia   Plan: Patient is to utilize [employer] [facility], thought re-framing, relaxation techniques, and coping strategies to decrease symptoms associated with their diagnosis. Frequency: bi-weekly  Modality: individual     Long-term goal:   To be determined during follow up appointment on 11/08/22.  Short-term goal:  Identify triggers for anxiety and develop coping strategies to utilize in response to reduce feelings of panic, avoidance, fear, feeling patient's surroundings on closing in, and feeling the need to escape each time patient travels per patient's report   Target Date: 04/13/23  Progress: 0                     Doree Barthel, LCSW

## 2022-10-12 MED ORDER — ALPRAZOLAM 0.5 MG PO TABS: 0.5000 mg | ORAL_TABLET | Freq: Two times a day (BID) | ORAL | 0 refills | Status: DC | PRN

## 2022-10-12 NOTE — Telephone Encounter (Signed)
I sent xanax Use with caution of sedation and habit and falls  Do not drive with it (she is aware)

## 2022-10-12 NOTE — Telephone Encounter (Signed)
Left VM requesting pt to call the office back 

## 2022-10-12 NOTE — Telephone Encounter (Signed)
Pt said she has discussed this with PCP before. Pt has to go visit her brother and he lives in [place] and they have to drive through the [place] to get there. Pt said she gets very anxious when driving through them. Pt said her husband is driving so she will not be operating a car with med in her sxs but she just needs something to help while they are driving there and back. Pt is leaving next weekend.  [facility] [address]

## 2022-10-13 NOTE — Telephone Encounter (Signed)
Pt aware.

## 2022-11-08 ENCOUNTER — Ambulatory Visit: Payer: No Typology Code available for payment source | Admitting: Clinical

## 2023-02-15 ENCOUNTER — Encounter: Payer: Self-pay | Admitting: Family Medicine

## 2023-02-15 ENCOUNTER — Ambulatory Visit: Payer: No Typology Code available for payment source | Admitting: Family Medicine

## 2023-02-15 VITALS — BP 122/64 | HR 78 | Temp 98.7°F | Ht 62.5 in | Wt 203.1 lb

## 2023-02-15 DIAGNOSIS — J208 Acute bronchitis due to other specified organisms: Secondary | ICD-10-CM

## 2023-02-15 DIAGNOSIS — J209 Acute bronchitis, unspecified: Secondary | ICD-10-CM

## 2023-02-15 HISTORY — DX: Acute bronchitis, unspecified: J20.9

## 2023-02-15 MED ORDER — AZITHROMYCIN 250 MG PO TABS: ORAL_TABLET | ORAL | 0 refills | Status: AC

## 2023-02-15 NOTE — Progress Notes (Signed)
Victoria Wysocki T. Victoria Olejnik, MD, CAQ Sports Medicine [name] [address] [place], [address]  Phone: [phone]  FAX: [phone]  Victoria [name] - 62 y.o. female  MRN [id number]  Date of Birth: 11-12-1960  Date: 02/15/2023  PCP: Victoria Pimple, MD  Referral: Victoria Pimple, MD  Chief Complaint  Patient presents with   Sore Throat    C/o ST, cough- worse at night, head congestion, drainage and R ear pain. Sxs about 2 wks ago.    Subjective:   Victoria [name] is a 62 y.o. very pleasant female patient with Body mass index is 36.56 kg/m. who presents with the following:  She is a very pleasant young lady, she has been sick now for roughly 2 weeks.  She initially had more of a nasal and upper respiratory congestion, this has moved and is also in the chest now.  She has a significant cough that has significant sputum production.  She has not had any fevers, chills, sweats, or polyarthralgia.  She did have 1 negative COVID test.  Had a bad cough cong at head R ear pain  Coughing up phlegm    Review of Systems is noted in the HPI, as appropriate  Objective:   BP 122/64   Pulse 78   Temp 98.7 F (37.1 C) (Oral)   Ht 5' 2.5" (1.588 m)   Wt 203 lb 2 oz (92.1 kg)   SpO2 98%   BMI 36.56 kg/m    Gen: WDWN, NAD. Globally Non-toxic HEENT: Throat clear, w/o exudate, R TM clear, L TM - good landmarks, No fluid present. rhinnorhea.  MMM Frontal sinuses: NT Max sinuses: NT NECK: Anterior cervical  LAD is absent CV: RRR, No M/G/R, cap refill <2 sec PULM: Breathing comfortably in no respiratory distress. no wheezing, crackles, rhonchi   Laboratory and Imaging Data:  Assessment and Plan:     ICD-10-CM   1. Acute bronchitis due to other specified organisms  J20.8      2 weeks of symptoms with no improvement, will cover for atypicals.  Medication Management during today's office visit: Meds ordered this encounter  Medications    azithromycin (ZITHROMAX) 250 MG tablet    Sig: Take 2 tablets (500 mg total) by mouth daily for 1 day, THEN 1 tablet (250 mg total) daily for 4 days.    Dispense:  6 tablet    Refill:  0   Medications Discontinued During This Encounter  Medication Reason   triamcinolone cream (KENALOG) 0.1 % Completed Course    Orders placed today for conditions managed today: No orders of the defined types were placed in this encounter.   Disposition: No follow-ups on file.  Dragon [employer] One speech-to-text software was used for transcription in this dictation.  Possible transcriptional errors can occur using Animal nutritionist.   Signed,  Elpidio Galea. Maricela Schreur, MD   Outpatient Encounter Medications as of 02/15/2023  Medication Sig   ALPRAZolam (XANAX) 0.5 MG tablet Take 1 tablet (0.5 mg total) by mouth 2 (two) times daily as needed for anxiety (for travel anxiety). Caution of sedation   azithromycin (ZITHROMAX) 250 MG tablet Take 2 tablets (500 mg total) by mouth daily for 1 day, THEN 1 tablet (250 mg total) daily for 4 days.   Multiple Vitamin (MULTIVITAMIN) capsule Take 1 capsule by mouth daily.   nizatidine (AXID) 150 MG capsule Take 1 capsule (150 mg total) by mouth 2 (two) times daily.   [  DISCONTINUED] triamcinolone cream (KENALOG) 0.1 % Apply 1 Application topically 2 (two) times daily. To affected area   No facility-administered encounter medications on file as of 02/15/2023.

## 2023-04-25 ENCOUNTER — Other Ambulatory Visit: Payer: Self-pay | Admitting: Surgery

## 2023-04-25 DIAGNOSIS — S83203A Other tear of unspecified meniscus, current injury, right knee, initial encounter: Secondary | ICD-10-CM

## 2023-04-27 ENCOUNTER — Encounter: Payer: Self-pay | Admitting: Surgery

## 2023-05-06 ENCOUNTER — Ambulatory Visit
Admission: RE | Admit: 2023-05-06 | Discharge: 2023-05-06 | Disposition: A | Discharge status: Home / Self Care | Payer: No Typology Code available for payment source | Source: Ambulatory Visit | Attending: Surgery | Admitting: Surgery

## 2023-05-06 DIAGNOSIS — S83203A Other tear of unspecified meniscus, current injury, right knee, initial encounter: Secondary | ICD-10-CM

## 2023-05-17 DIAGNOSIS — M1711 Unilateral primary osteoarthritis, right knee: Secondary | ICD-10-CM

## 2023-05-17 HISTORY — DX: Unilateral primary osteoarthritis, right knee: M17.11

## 2023-05-26 ENCOUNTER — Other Ambulatory Visit: Payer: Self-pay | Admitting: Surgery

## 2023-05-29 ENCOUNTER — Other Ambulatory Visit: Payer: Self-pay | Admitting: Surgery

## 2023-05-31 ENCOUNTER — Inpatient Hospital Stay: Admission: RE | Admit: 2023-05-31 | Payer: No Typology Code available for payment source | Source: Ambulatory Visit

## 2023-05-31 ENCOUNTER — Other Ambulatory Visit: Payer: Self-pay

## 2023-05-31 ENCOUNTER — Encounter
Admission: RE | Admit: 2023-05-31 | Discharge: 2023-05-31 | Disposition: A | Discharge status: Home / Self Care | Payer: No Typology Code available for payment source | Source: Ambulatory Visit | Attending: Surgery | Admitting: Surgery

## 2023-05-31 HISTORY — DX: Hyperlipidemia, unspecified: E78.5

## 2023-05-31 HISTORY — DX: Prediabetes: R73.03

## 2023-05-31 HISTORY — DX: Vitamin D deficiency, unspecified: E55.9

## 2023-05-31 MED ORDER — ORAL CARE MOUTH RINSE: 15.0000 mL | Freq: Once | OROMUCOSAL | Status: AC

## 2023-05-31 MED ORDER — CHLORHEXIDINE GLUCONATE 0.12 % MT SOLN
15.0000 mL | Freq: Once | OROMUCOSAL | Status: AC
  Administered 2023-06-01: 15 mL via OROMUCOSAL

## 2023-05-31 MED ORDER — LACTATED RINGERS IV SOLN: INTRAVENOUS | Status: DC

## 2023-05-31 MED ORDER — CEFAZOLIN SODIUM-DEXTROSE 2-4 GM/100ML-% IV SOLN
2.0000 g | INTRAVENOUS | Status: AC
  Administered 2023-06-01: 2 g via INTRAVENOUS

## 2023-05-31 MED ORDER — TRANEXAMIC ACID-NACL 1000-0.7 MG/100ML-% IV SOLN
1000.0000 mg | INTRAVENOUS | Status: AC
  Administered 2023-06-01: 1000 mg via INTRAVENOUS

## 2023-05-31 NOTE — Patient Instructions (Addendum)
 Your procedure is scheduled on: Thursday, January 16 Report to the Registration Desk on the 1st floor of the [facility] To find out your arrival time, please call [phone] between 1PM - 3PM on: Wednesday, January 15 If your arrival time is 6:00 am, do not arrive before that time as the [facility] entrance doors do not open until 6:00 am.  REMEMBER: Instructions that are not followed completely may result in serious medical risk, up to and including death; or upon the discretion of your surgeon and anesthesiologist your surgery may need to be rescheduled.  Do not eat food after midnight the night before surgery.  No gum chewing or hard candies.  You may however, drink CLEAR liquids up to 2 hours before you are scheduled to arrive for your surgery. Do not drink anything within 2 hours of your scheduled arrival time.  Clear liquids include: - water  - apple juice without pulp - gatorade (not RED colors) - black coffee or tea (Do NOT add milk or creamers to the coffee or tea) Do NOT drink anything that is not on this list.  In addition, your doctor has ordered for you to drink the provided:  Ensure Pre-Surgery Clear Carbohydrate Drink  Drinking this carbohydrate drink up to two hours before surgery helps to reduce insulin  resistance and improve patient outcomes. Please complete drinking 2 hours before scheduled arrival time.  One week prior to surgery: Stop Anti-inflammatories (NSAIDS) such as Advil, Aleve, Ibuprofen, Motrin, Naproxen, Naprosyn and Aspirin  based products such as Excedrin, Goody's Powder, BC Powder. Stop ANY OVER THE COUNTER supplements until after surgery.  You may however, continue to take Tylenol  if needed for pain up until the day of surgery.  Continue taking all of your other prescription medications up until the day of surgery.  ON THE DAY OF SURGERY DO NOT TAKE ANY MEDICATIONS   No Alcohol for 24 hours before or after surgery.  No Smoking including  e-cigarettes for 24 hours before surgery.  No chewable tobacco products for at least 6 hours before surgery.  No nicotine patches on the day of surgery.  Do not use any "recreational" drugs for at least a week (preferably 2 weeks) before your surgery.  Please be advised that the combination of cocaine and anesthesia may have negative outcomes, up to and including death. If you test positive for cocaine, your surgery will be cancelled.  On the morning of surgery brush your teeth with toothpaste and water, you may rinse your mouth with mouthwash if you wish. Do not swallow any toothpaste or mouthwash.  Use CHG Soap as directed on instruction sheet.  Do not wear jewelry, make-up, hairpins, clips or nail polish.  For welded (permanent) jewelry: bracelets, anklets, waist bands, etc.  Please have this removed prior to surgery.  If it is not removed, there is a chance that hospital personnel will need to cut it off on the day of surgery.  Do not wear lotions, powders, or perfumes.   Do not shave body hair from the neck down 48 hours before surgery.  Contact lenses, hearing aids and dentures may not be worn into surgery.  Do not bring valuables to the hospital. [facility] is not responsible for any missing/lost belongings or valuables.   Notify your doctor if there is any change in your medical condition (cold, fever, infection).  Wear comfortable clothing (specific to your surgery type) to the hospital.  After surgery, you can help prevent lung complications by doing  breathing exercises.  Take deep breaths and cough every 1-2 hours. Your doctor may order a device called an Incentive Spirometer to help you take deep breaths.  If you are being discharged the day of surgery, you will not be allowed to drive home. You will need a responsible individual to drive you home and stay with you for 24 hours after surgery.   If you are taking public transportation, you will need to have a  responsible individual with you.  Please call the Pre-admissions Testing Dept. at [phone] if you have any questions about these instructions.  Surgery Visitation Policy:  Patients having surgery or a procedure may have two visitors.  Children under the age of 65 must have an adult with them who is not the patient.  Temporary Visitor Restrictions Due to increasing cases of flu, RSV and COVID-19: Children ages 65 and under will not be able to visit patients in [facility] hospitals under most circumstances.       Preparing for Surgery with CHLORHEXIDINE  GLUCONATE (CHG) Soap  Chlorhexidine  Gluconate (CHG) Soap  o An antiseptic cleaner that kills germs and bonds with the skin to continue killing germs even after washing  o Used for showering the night before surgery and morning of surgery  Before surgery, you can play an important role by reducing the number of germs on your skin.  CHG (Chlorhexidine  gluconate) soap is an antiseptic cleanser which kills germs and bonds with the skin to continue killing germs even after washing.  Please do not use if you have an allergy to CHG or antibacterial soaps. If your skin becomes reddened/irritated stop using the CHG.  1. Shower the NIGHT BEFORE SURGERY and the MORNING OF SURGERY with CHG soap.  2. If you choose to wash your hair, wash your hair first as usual with your normal shampoo.  3. After shampooing, rinse your hair and body thoroughly to remove the shampoo.  4. Use CHG as you would any other liquid soap. You can apply CHG directly to the skin and wash gently with a scrungie or a clean washcloth.  5. Apply the CHG soap to your body only from the neck down. Do not use on open wounds or open sores. Avoid contact with your eyes, ears, mouth, and genitals (private parts). Wash face and genitals (private parts) with your normal soap.  6. Wash thoroughly, paying special attention to the area where your surgery will be  performed.  7. Thoroughly rinse your body with warm water.  8. Do not shower/wash with your normal soap after using and rinsing off the CHG soap.  9. Pat yourself dry with a clean towel.  10. Wear clean pajamas to bed the night before surgery.  12. Place clean sheets on your bed the night of your first shower and do not sleep with pets.  13. Shower again with the CHG soap on the day of surgery prior to arriving at the hospital.  14. Do not apply any deodorants/lotions/powders.  15. Please wear clean clothes to the hospital.  Preoperative Educational Videos for Total Hip, Knee and Shoulder Replacements  To better prepare for surgery, please view our videos that explain the physical activity and discharge planning required to have the best surgical recovery at [facility]  [email or web address]  Questions? Call [phone] or email [email or web address]

## 2023-06-01 ENCOUNTER — Encounter: Payer: Self-pay | Admitting: Surgery

## 2023-06-01 ENCOUNTER — Other Ambulatory Visit: Payer: Self-pay

## 2023-06-01 ENCOUNTER — Ambulatory Visit
Admission: RE | Admit: 2023-06-01 | Discharge: 2023-06-01 | Disposition: A | Discharge status: Home / Self Care | Payer: No Typology Code available for payment source | Attending: Surgery | Admitting: Surgery

## 2023-06-01 ENCOUNTER — Ambulatory Visit: Payer: No Typology Code available for payment source

## 2023-06-01 ENCOUNTER — Ambulatory Visit: Payer: Self-pay | Admitting: Urgent Care

## 2023-06-01 ENCOUNTER — Ambulatory Visit: Payer: Self-pay | Admitting: Anesthesiology

## 2023-06-01 ENCOUNTER — Encounter
Admission: RE | Disposition: A | Discharge status: Home / Self Care | Payer: Self-pay | Source: Home / Self Care | Attending: Surgery

## 2023-06-01 DIAGNOSIS — M25761 Osteophyte, right knee: Secondary | ICD-10-CM | POA: Insufficient documentation

## 2023-06-01 DIAGNOSIS — M1711 Unilateral primary osteoarthritis, right knee: Secondary | ICD-10-CM | POA: Insufficient documentation

## 2023-06-01 DIAGNOSIS — R7303 Prediabetes: Secondary | ICD-10-CM | POA: Insufficient documentation

## 2023-06-01 DIAGNOSIS — S83231A Complex tear of medial meniscus, current injury, right knee, initial encounter: Secondary | ICD-10-CM | POA: Insufficient documentation

## 2023-06-01 DIAGNOSIS — X58XXXA Exposure to other specified factors, initial encounter: Secondary | ICD-10-CM | POA: Diagnosis not present

## 2023-06-01 DIAGNOSIS — E669 Obesity, unspecified: Secondary | ICD-10-CM | POA: Diagnosis not present

## 2023-06-01 DIAGNOSIS — Z6835 Body mass index (BMI) 35.0-35.9, adult: Secondary | ICD-10-CM | POA: Diagnosis not present

## 2023-06-01 DIAGNOSIS — Z01812 Encounter for preprocedural laboratory examination: Secondary | ICD-10-CM

## 2023-06-01 HISTORY — PX: PARTIAL KNEE ARTHROPLASTY: SHX2174

## 2023-06-01 LAB — CBC WITH DIFFERENTIAL/PLATELET
Abs Immature Granulocytes: 0.03 10*3/uL (ref 0.00–0.07)
Basophils Absolute: 0.1 10*3/uL (ref 0.0–0.1)
Basophils Relative: 1 %
Eosinophils Absolute: 0.1 10*3/uL (ref 0.0–0.5)
Eosinophils Relative: 1 %
HCT: 38.6 % (ref 36.0–46.0)
Hemoglobin: 12.9 g/dL (ref 12.0–15.0)
Immature Granulocytes: 0 %
Lymphocytes Relative: 28 %
Lymphs Abs: 1.9 10*3/uL (ref 0.7–4.0)
MCH: 27.7 pg (ref 26.0–34.0)
MCHC: 33.4 g/dL (ref 30.0–36.0)
MCV: 82.8 fL (ref 80.0–100.0)
Monocytes Absolute: 0.5 10*3/uL (ref 0.1–1.0)
Monocytes Relative: 8 %
Neutro Abs: 4.2 10*3/uL (ref 1.7–7.7)
Neutrophils Relative %: 62 %
Platelets: 349 10*3/uL (ref 150–400)
RBC: 4.66 MIL/uL (ref 3.87–5.11)
RDW: 12.5 % (ref 11.5–15.5)
WBC: 6.7 10*3/uL (ref 4.0–10.5)
nRBC: 0 % (ref 0.0–0.2)

## 2023-06-01 LAB — COMPREHENSIVE METABOLIC PANEL
ALT: 16 U/L (ref 0–44)
AST: 16 U/L (ref 15–41)
Albumin: 3.8 g/dL (ref 3.5–5.0)
Alkaline Phosphatase: 64 U/L (ref 38–126)
Anion gap: 9 (ref 5–15)
BUN: 13 mg/dL (ref 8–23)
CO2: 24 mmol/L (ref 22–32)
Calcium: 8.9 mg/dL (ref 8.9–10.3)
Chloride: 105 mmol/L (ref 98–111)
Creatinine, Ser: 0.63 mg/dL (ref 0.44–1.00)
GFR, Estimated: >60 mL/min (ref >60)
Glucose, Bld: 92 mg/dL (ref 70–99)
Potassium: 4 mmol/L (ref 3.5–5.1)
Sodium: 138 mmol/L (ref 135–145)
Total Bilirubin: 0.7 mg/dL (ref 0.0–1.2)
Total Protein: 6.7 g/dL (ref 6.5–8.1)

## 2023-06-01 LAB — URINALYSIS, ROUTINE W REFLEX MICROSCOPIC
Bacteria, UA: NONE SEEN
Bilirubin Urine: NEGATIVE
Glucose, UA: NEGATIVE mg/dL
Hgb urine dipstick: NEGATIVE
Ketones, ur: NEGATIVE mg/dL
Nitrite: NEGATIVE
Protein, ur: NEGATIVE mg/dL
Specific Gravity, Urine: 1.016 (ref 1.005–1.030)
pH: 8 (ref 5.0–8.0)

## 2023-06-01 LAB — SURGICAL PCR SCREEN
MRSA, PCR: NEGATIVE
Staphylococcus aureus: NEGATIVE

## 2023-06-01 SURGERY — ARTHROPLASTY, KNEE, UNICOMPARTMENTAL
Anesthesia: General | Site: Knee | Laterality: Right

## 2023-06-01 MED ORDER — MIDAZOLAM HCL 5 MG/5ML IJ SOLN
INTRAMUSCULAR | Status: DC | PRN
  Administered 2023-06-01: 2 mg via INTRAVENOUS

## 2023-06-01 MED ORDER — 0.9 % SODIUM CHLORIDE (POUR BTL) OPTIME
TOPICAL | Status: DC | PRN
  Administered 2023-06-01: 500 mL

## 2023-06-01 MED ORDER — PHENYLEPHRINE 80 MCG/ML (10ML) SYRINGE FOR IV PUSH (FOR BLOOD PRESSURE SUPPORT)
PREFILLED_SYRINGE | INTRAVENOUS | Status: DC | PRN
  Administered 2023-06-01 (×8): 160 ug via INTRAVENOUS

## 2023-06-01 MED ORDER — ONDANSETRON HCL 4 MG PO TABS: 4.0000 mg | ORAL_TABLET | Freq: Four times a day (QID) | ORAL | Status: DC | PRN

## 2023-06-01 MED ORDER — FENTANYL CITRATE (PF) 100 MCG/2ML IJ SOLN
INTRAMUSCULAR | Status: AC
  Filled 2023-06-01: qty 2

## 2023-06-01 MED ORDER — CEFAZOLIN SODIUM-DEXTROSE 2-4 GM/100ML-% IV SOLN
INTRAVENOUS | Status: AC
  Filled 2023-06-01: qty 100

## 2023-06-01 MED ORDER — PROPOFOL 10 MG/ML IV BOLUS
INTRAVENOUS | Status: DC | PRN
  Administered 2023-06-01: 10 mg via INTRAVENOUS
  Administered 2023-06-01: 30 mg via INTRAVENOUS
  Administered 2023-06-01 (×2): 20 mg via INTRAVENOUS
  Administered 2023-06-01: 10 mg via INTRAVENOUS
  Administered 2023-06-01: 20 mg via INTRAVENOUS

## 2023-06-01 MED ORDER — BUPIVACAINE-EPINEPHRINE (PF) 0.5% -1:200000 IJ SOLN
INTRAMUSCULAR | Status: DC | PRN
  Administered 2023-06-01: 30 mL

## 2023-06-01 MED ORDER — CHLORHEXIDINE GLUCONATE 0.12 % MT SOLN
OROMUCOSAL | Status: AC
  Filled 2023-06-01: qty 15

## 2023-06-01 MED ORDER — SODIUM CHLORIDE 0.9 % BOLUS PEDS
250.0000 mL | Freq: Once | INTRAVENOUS | Status: AC
  Administered 2023-06-01: 250 mL via INTRAVENOUS

## 2023-06-01 MED ORDER — ACETAMINOPHEN 10 MG/ML IV SOLN
INTRAVENOUS | Status: DC | PRN
  Administered 2023-06-01: 1000 mg via INTRAVENOUS

## 2023-06-01 MED ORDER — SODIUM CHLORIDE FLUSH 0.9 % IV SOLN
INTRAVENOUS | Status: AC
  Filled 2023-06-01: qty 10

## 2023-06-01 MED ORDER — FENTANYL CITRATE (PF) 100 MCG/2ML IJ SOLN: 25.0000 ug | INTRAMUSCULAR | Status: DC | PRN

## 2023-06-01 MED ORDER — OXYCODONE HCL 5 MG PO TABS: 5.0000 mg | ORAL_TABLET | ORAL | 0 refills | Status: DC | PRN

## 2023-06-01 MED ORDER — DEXAMETHASONE SODIUM PHOSPHATE 10 MG/ML IJ SOLN
INTRAMUSCULAR | Status: AC
  Filled 2023-06-01: qty 1

## 2023-06-01 MED ORDER — LACTATED RINGERS IV SOLN: INTRAVENOUS | Status: AC

## 2023-06-01 MED ORDER — LIDOCAINE HCL (CARDIAC) PF 100 MG/5ML IV SOSY
PREFILLED_SYRINGE | INTRAVENOUS | Status: DC | PRN
  Administered 2023-06-01: 40 mg via INTRAVENOUS
  Administered 2023-06-01: 20 mg via INTRAVENOUS

## 2023-06-01 MED ORDER — DEXMEDETOMIDINE HCL IN NACL 80 MCG/20ML IV SOLN
INTRAVENOUS | Status: AC
  Filled 2023-06-01: qty 20

## 2023-06-01 MED ORDER — TRANEXAMIC ACID-NACL 1000-0.7 MG/100ML-% IV SOLN
INTRAVENOUS | Status: AC
  Filled 2023-06-01: qty 100

## 2023-06-01 MED ORDER — BUPIVACAINE LIPOSOME 1.3 % IJ SUSP
INTRAMUSCULAR | Status: DC | PRN
  Administered 2023-06-01: 20 mL

## 2023-06-01 MED ORDER — ACETAMINOPHEN 10 MG/ML IV SOLN
1000.0000 mg | Freq: Once | INTRAVENOUS | Status: DC | PRN
Start: 2023-06-01 — End: 2023-06-01

## 2023-06-01 MED ORDER — ONDANSETRON 4 MG PO TBDP: 4.0000 mg | ORAL_TABLET | Freq: Three times a day (TID) | ORAL | 1 refills | Status: DC | PRN

## 2023-06-01 MED ORDER — KETOROLAC TROMETHAMINE 15 MG/ML IJ SOLN
INTRAMUSCULAR | Status: AC
  Filled 2023-06-01: qty 1

## 2023-06-01 MED ORDER — SODIUM CHLORIDE (PF) 0.9 % IJ SOLN
INTRAMUSCULAR | Status: DC | PRN
  Administered 2023-06-01: 10 mL

## 2023-06-01 MED ORDER — ACETAMINOPHEN 325 MG PO TABS: 325.0000 mg | ORAL_TABLET | Freq: Four times a day (QID) | ORAL | Status: DC | PRN

## 2023-06-01 MED ORDER — FENTANYL CITRATE (PF) 100 MCG/2ML IJ SOLN
INTRAMUSCULAR | Status: DC | PRN
  Administered 2023-06-01 (×2): 25 ug via INTRAVENOUS

## 2023-06-01 MED ORDER — METOCLOPRAMIDE HCL 5 MG/ML IJ SOLN: 5.0000 mg | Freq: Three times a day (TID) | INTRAMUSCULAR | Status: DC | PRN

## 2023-06-01 MED ORDER — OXYCODONE HCL 5 MG PO TABS
5.0000 mg | ORAL_TABLET | Freq: Once | ORAL | Status: AC | PRN
  Administered 2023-06-01: 5 mg via ORAL

## 2023-06-01 MED ORDER — PROPOFOL 1000 MG/100ML IV EMUL
INTRAVENOUS | Status: AC
  Filled 2023-06-01: qty 100

## 2023-06-01 MED ORDER — ONDANSETRON HCL 4 MG/2ML IJ SOLN: 4.0000 mg | Freq: Once | INTRAMUSCULAR | Status: DC | PRN

## 2023-06-01 MED ORDER — ONDANSETRON HCL 4 MG/2ML IJ SOLN
INTRAMUSCULAR | Status: AC
  Filled 2023-06-01: qty 2

## 2023-06-01 MED ORDER — OXYCODONE HCL 5 MG PO TABS: 5.0000 mg | ORAL_TABLET | ORAL | Status: DC | PRN

## 2023-06-01 MED ORDER — BUPIVACAINE HCL (PF) 0.5 % IJ SOLN
INTRAMUSCULAR | Status: AC
  Filled 2023-06-01: qty 10

## 2023-06-01 MED ORDER — PHENYLEPHRINE 80 MCG/ML (10ML) SYRINGE FOR IV PUSH (FOR BLOOD PRESSURE SUPPORT)
PREFILLED_SYRINGE | INTRAVENOUS | Status: AC
  Filled 2023-06-01: qty 20

## 2023-06-01 MED ORDER — KETOROLAC TROMETHAMINE 30 MG/ML IJ SOLN
INTRAMUSCULAR | Status: DC | PRN
  Administered 2023-06-01: 30 mg via INTRAMUSCULAR

## 2023-06-01 MED ORDER — TRIAMCINOLONE ACETONIDE 40 MG/ML IJ SUSP
INTRAMUSCULAR | Status: AC
  Filled 2023-06-01: qty 2

## 2023-06-01 MED ORDER — SODIUM CHLORIDE 0.9 % IR SOLN
Status: DC | PRN
  Administered 2023-06-01: 3000 mL

## 2023-06-01 MED ORDER — ONDANSETRON HCL 4 MG/2ML IJ SOLN: 4.0000 mg | Freq: Four times a day (QID) | INTRAMUSCULAR | Status: DC | PRN

## 2023-06-01 MED ORDER — DEXAMETHASONE SODIUM PHOSPHATE 10 MG/ML IJ SOLN
INTRAMUSCULAR | Status: DC | PRN
  Administered 2023-06-01: 10 mg via INTRAVENOUS

## 2023-06-01 MED ORDER — DEXMEDETOMIDINE HCL IN NACL 80 MCG/20ML IV SOLN
INTRAVENOUS | Status: DC | PRN
  Administered 2023-06-01: 8 ug via INTRAVENOUS

## 2023-06-01 MED ORDER — METOCLOPRAMIDE HCL 10 MG PO TABS: 5.0000 mg | ORAL_TABLET | Freq: Three times a day (TID) | ORAL | Status: DC | PRN

## 2023-06-01 MED ORDER — KETOROLAC TROMETHAMINE 15 MG/ML IJ SOLN
15.0000 mg | Freq: Once | INTRAMUSCULAR | Status: AC
  Administered 2023-06-01: 15 mg via INTRAVENOUS

## 2023-06-01 MED ORDER — DEXTROSE-SODIUM CHLORIDE 5-0.9 % IV SOLN: INTRAVENOUS | Status: DC

## 2023-06-01 MED ORDER — BUPIVACAINE LIPOSOME 1.3 % IJ SUSP
INTRAMUSCULAR | Status: AC
  Filled 2023-06-01: qty 20

## 2023-06-01 MED ORDER — TRIAMCINOLONE ACETONIDE 40 MG/ML IJ SUSP
INTRAMUSCULAR | Status: DC | PRN
  Administered 2023-06-01: 80 mg via INTRAMUSCULAR

## 2023-06-01 MED ORDER — ACETAMINOPHEN 10 MG/ML IV SOLN
INTRAVENOUS | Status: AC
  Filled 2023-06-01: qty 100

## 2023-06-01 MED ORDER — OXYCODONE HCL 5 MG PO TABS
ORAL_TABLET | ORAL | Status: AC
  Filled 2023-06-01: qty 1

## 2023-06-01 MED ORDER — BUPIVACAINE HCL (PF) 0.5 % IJ SOLN
INTRAMUSCULAR | Status: DC | PRN
  Administered 2023-06-01: 3 mL via INTRATHECAL

## 2023-06-01 MED ORDER — OXYCODONE HCL 5 MG/5ML PO SOLN: 5.0000 mg | Freq: Once | ORAL | Status: AC | PRN

## 2023-06-01 MED ORDER — BUPIVACAINE-EPINEPHRINE (PF) 0.5% -1:200000 IJ SOLN
INTRAMUSCULAR | Status: AC
  Filled 2023-06-01: qty 30

## 2023-06-01 MED ORDER — CEFAZOLIN SODIUM-DEXTROSE 2-4 GM/100ML-% IV SOLN
2.0000 g | Freq: Four times a day (QID) | INTRAVENOUS | Status: DC
  Administered 2023-06-01: 2 g via INTRAVENOUS

## 2023-06-01 MED ORDER — APIXABAN 2.5 MG PO TABS: 2.5000 mg | ORAL_TABLET | Freq: Two times a day (BID) | ORAL | 0 refills | Status: DC

## 2023-06-01 MED ORDER — ONDANSETRON HCL 4 MG/2ML IJ SOLN
INTRAMUSCULAR | Status: DC | PRN
  Administered 2023-06-01: 4 mg via INTRAVENOUS

## 2023-06-01 MED ORDER — MIDAZOLAM HCL 2 MG/2ML IJ SOLN
INTRAMUSCULAR | Status: AC
  Filled 2023-06-01: qty 2

## 2023-06-01 MED ORDER — PROPOFOL 500 MG/50ML IV EMUL
INTRAVENOUS | Status: DC | PRN
  Administered 2023-06-01: 100 ug/kg/min via INTRAVENOUS
  Administered 2023-06-01: 50 ug/kg/min via INTRAVENOUS

## 2023-06-01 SURGICAL SUPPLY — 55 items
BEARING TIBIAL OXFORD R XS SZ3 (Knees) IMPLANT
BIT DRILL QUICK REL 1/8 2PK SL (BIT) IMPLANT
BNDG ELASTIC 6INX 5YD STR LF (GAUZE/BANDAGES/DRESSINGS) ×2 IMPLANT
CEMENT BONE R 1X40 (Cement) ×2 IMPLANT
CEMENT VACUUM MIXING SYSTEM (MISCELLANEOUS) ×2 IMPLANT
CHLORAPREP W/TINT 26 (MISCELLANEOUS) ×4 IMPLANT
COOLER POLAR GLACIER W/PUMP (MISCELLANEOUS) ×2 IMPLANT
COVER MAYO STAND STRL (DRAPES) ×2 IMPLANT
CUFF TRNQT CYL 24X4X16.5-23 (TOURNIQUET CUFF) IMPLANT
CUFF TRNQT CYL 34X4.125X (TOURNIQUET CUFF) IMPLANT
DRAPE C-ARM XRAY 36X54 (DRAPES) IMPLANT
DRAPE U-SHAPE 47X51 STRL (DRAPES) ×4 IMPLANT
DRSG MEPILEX SACRM 8.7X9.8 (GAUZE/BANDAGES/DRESSINGS) ×2 IMPLANT
DRSG OPSITE POSTOP 4X12 (GAUZE/BANDAGES/DRESSINGS) ×2 IMPLANT
DRSG OPSITE POSTOP 4X6 (GAUZE/BANDAGES/DRESSINGS) ×2 IMPLANT
ELECT CAUTERY BLADE 6.4 (BLADE) ×2 IMPLANT
ELECT REM PT RETURN 9FT ADLT (ELECTROSURGICAL) ×1
ELECTRODE REM PT RTRN 9FT ADLT (ELECTROSURGICAL) ×2 IMPLANT
GAUZE SPONGE 4X4 12PLY STRL (GAUZE/BANDAGES/DRESSINGS) ×2 IMPLANT
GAUZE XEROFORM 1X8 LF (GAUZE/BANDAGES/DRESSINGS) ×2 IMPLANT
GLOVE BIO SURGEON STRL SZ7.5 (GLOVE) ×8 IMPLANT
GLOVE BIO SURGEON STRL SZ8 (GLOVE) ×8 IMPLANT
GLOVE BIOGEL PI IND STRL 8 (GLOVE) ×2 IMPLANT
GOWN STRL REUS W/ TWL LRG LVL3 (GOWN DISPOSABLE) ×2 IMPLANT
GOWN STRL REUS W/ TWL XL LVL3 (GOWN DISPOSABLE) ×2 IMPLANT
HOOD PEEL AWAY T7 (MISCELLANEOUS) ×6 IMPLANT
IV NS IRRIG 3000ML ARTHROMATIC (IV SOLUTION) ×2 IMPLANT
KIT TURNOVER KIT A (KITS) ×2 IMPLANT
KNEE PARTIAL CEMENT FEM XS (Miscellaneous) IMPLANT
MANIFOLD NEPTUNE II (INSTRUMENTS) ×2 IMPLANT
MAT ABSORB FLUID 56X50 GRAY (MISCELLANEOUS) ×2 IMPLANT
NDL SAFETY ECLIPSE 18X1.5 (NEEDLE) ×2 IMPLANT
NDL SPNL 20GX3.5 QUINCKE YW (NEEDLE) ×2 IMPLANT
NEEDLE SPNL 20GX3.5 QUINCKE YW (NEEDLE) ×1
NS IRRIG 1000ML POUR BTL (IV SOLUTION) ×2 IMPLANT
PACK BLADE SAW RECIP 70 3 PT (BLADE) IMPLANT
PACK TOTAL KNEE (MISCELLANEOUS) ×2 IMPLANT
PAD ABD DERMACEA PRESS 5X9 (GAUZE/BANDAGES/DRESSINGS) ×4 IMPLANT
PAD WRAPON POLAR KNEE (MISCELLANEOUS) ×2 IMPLANT
PENCIL SMOKE EVACUATOR (MISCELLANEOUS) ×2 IMPLANT
PULSAVAC PLUS IRRIG FAN TIP (DISPOSABLE) ×1
STAPLER SKIN PROX 35W (STAPLE) ×2 IMPLANT
STRAP SAFETY 5IN WIDE (MISCELLANEOUS) ×2 IMPLANT
SUCTION TUBE FRAZIER 10FR DISP (SUCTIONS) ×2 IMPLANT
SUT VIC AB 0 CT1 36 (SUTURE) ×2 IMPLANT
SUT VIC AB 2-0 CT1 TAPERPNT 27 (SUTURE) ×8 IMPLANT
SYR 10ML LL (SYRINGE) ×2 IMPLANT
SYR 30ML LL (SYRINGE) ×4 IMPLANT
SYR BULB IRRIG 60ML STRL (SYRINGE) IMPLANT
TAPE TRANSPORE STRL 2 31045 (GAUZE/BANDAGES/DRESSINGS) ×2 IMPLANT
TIP FAN IRRIG PULSAVAC PLUS (DISPOSABLE) ×2 IMPLANT
TRAP FLUID SMOKE EVACUATOR (MISCELLANEOUS) ×2 IMPLANT
TRAY TIBIAL KNEE OXFORD SZAA (Joint) IMPLANT
WATER STERILE IRR 500ML POUR (IV SOLUTION) ×2 IMPLANT
WRAPON POLAR PAD KNEE (MISCELLANEOUS) ×1

## 2023-06-01 NOTE — Transfer of Care (Signed)
Immediate Anesthesia Transfer of Care Note  Patient: Victoria [name]  Procedure(s) Performed: Procedure(s): Right unicondylar knee arthroplasty (Right)  Patient Location: PACU  Anesthesia Type:Spinal  Level of Consciousness: awake, alert  and oriented  Airway & Oxygen Therapy: Patient Spontanous Breathing and Patient connected to face mask oxygen  Post-op Assessment: Report given to RN and Post -op Vital signs reviewed and stable  Post vital signs: Reviewed and stable  Last Vitals:  Vitals:   06/01/23 0815 06/01/23 1306  BP: (!) 141/74 126/64  Pulse: 68 87  Resp: 16 17  Temp: (!) 36.3 C   SpO2: 96% 98%    Complications: No apparent anesthesia complications

## 2023-06-01 NOTE — Anesthesia Procedure Notes (Signed)
Date/Time: 06/01/2023 10:49 AM  Performed by: Stormy Fabian, CRNAPre-anesthesia Checklist: Patient identified, Emergency Drugs available, Suction available and Patient being monitored Patient Re-evaluated:Patient Re-evaluated prior to induction Oxygen Delivery Method: Simple face mask Induction Type: IV induction Dental Injury: Teeth and Oropharynx as per pre-operative assessment

## 2023-06-01 NOTE — Op Note (Signed)
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  Post-Op Diagnosis:   Same  Procedure:   Right unicondylar knee arthroplasty.  Surgeon:   Maryagnes Amos, MD  Assistant:   Horris Latino, PA-C; Juanetta Snow, PA-S  Anesthesia:   Spinal  Findings:   As above.  Complications:   None  EBL:   5 cc  Fluids:   500 cc crystalloid  UOP:   None  TT:   80 minutes at 300 mmHg  Drains:   None  Closure:   Staples  Implants:   All-cemented Biomet Oxford system with an extra small femoral component, a "AA" sized tibial tray, and a 3 mm meniscal bearing insert.  Brief Clinical Note:   The patient is a 63 year old female who developed the acute onset of right knee pain 5 to 6 weeks ago. After initial treatment nonsurgical treatment including a steroid injection, activity modification, and medications, did not alleviate her symptoms, an MRI scan was ordered which confirmed the presence of a displaced root tear of the medial meniscus. The study also confirmed the presence of moderate degenerative changes involving the medial compartment. Therefore, the patient presents at this time for a right partial knee replacement.  Procedure:   The patient was brought into the operating room and a spinal placed by the anesthesiologist. The patient was lain in the supine position, then repositioned so that the non-surgical leg was placed in a flexed and abducted position in the yellow fin leg holder while the surgical extremity was placed over the Biomet leg holder. The right lower extremity was prepped with ChloraPrep solution before being draped sterilely. Preoperative antibiotics were administered. After performing a timeout to verify the appropriate surgical site, the limb was exsanguinated with an Esmarch and the tourniquet inflated to 300 mmHg.   A standard anterior approach to the knee was  made through an approximately 3.5-4 inch incision. The incision was carried down through the subcutaneous tissues to expose the superficial retinaculum. This was split the length the incision and the medial flap elevated sufficiently to expose the medial retinaculum. The medial retinaculum was incised along the medial border of the patella tendon and extended proximally along the medial border of the patella, leaving a 3-4 mm cuff of tissue. The soft tissues were elevated off the anteromedial aspect of the proximal tibia. The anterior portion of the meniscus was removed after performing a subtotal excision of the infrapatellar fat pad. The anterior cruciate ligament was inspected and found to be in excellent condition. Osteophytes were removed from the inferior pole of the patella as well as from the notch using a quarter-inch osteotome. There were significant degenerative changes of both the femur and tibia on the medial side.   The medial femoral condyle was sized using the small and extra small sizers. It was felt that the extra small guide best optimized the contour of the femur. This was left in place and the external tibial guide positioned. The 3 mm coupling device was used to connect the guide to the medial femoral condylar sizer to optimize appropriate orientation. Two guide pins were inserted into the cutting block before the coupling device and sizer were removed. The appropriate tibial cut was made using the oscillating and reciprocating saws. The piece was removed in its entirety and taken to the back table where it was sized and found to be optimally  replicated by a "AA" sized component. The 8 mm spacer was inserted to verify that sufficient bone had been removed.  Attention was directed to femoral side. The intramedullary canal was accessed through a 4 mm drill hole. The intramedullary guide was positioned before the guide for the femoral condylar holes was positioned. The appropriate coupling  device connected this guide to the intramedullary guide before both drill holes were created in the distal aspect of the medial femoral condyle. The devices were removed and the posterior condylar cutting block inserted. The appropriate cut was made using the reciprocating saw and this piece removed. The #0 spigot was inserted and the initial bone milling performed. A trial femoral component was inserted and both the flexion and extension gaps measured. In flexion, the gap measured 6 mm whereas in extension, it measured 3 mm. Therefore, the # 3 spigot was selected and the secondary bone milling performed. Repeat sizing demonstrated symmetric flexion and extension gaps. The bone was removed from the postero-medial and postero-lateral aspects of the femoral condyle, as well as from the beneath the collar of the spigot. Bone also was removed from the anterior portion of the femur so as to minimize any potential impingement with the meniscal bearing insert. The trial components removed and several drill holes placed into the distal femoral condyle to further augment cement fixation.  Attention was redirected to the tibial side. The "AA" sized tibial tray was positioned and temporarily secured using the appropriate spiked nail. The keel was created using the bi-bladed reciprocating saw and hoe. The keeled "AA" sized trial tibial tray was inserted to be sure that it seated properly. At this point, a total of 20 cc of Exparel, 2 cc of Kenalog 40 (80 mg), 30 mg of Toradol, and 30 cc of 0.5% Sensorcaine diluted out to 60 cc with normal saline was injected in and around the posterior and medial capsular tissues, as well as the peri-incisional tissues to help with postoperative pain control.  The bony surfaces were prepared for cementing by irrigating them thoroughly with bacitracin saline solution using the jet lavage system before packing them with a dry Ray-Tec sponge. Meanwhile, cement was being mixed on the back  table. When the cement was ready, the tibial tray was cemented in first. The excess cement was removed using a Public house manager after impacting it into place. Next, the femoral component was impacted into place. Again the excess cement was removed using a Public house manager. The 3 mm spacer was inserted and the knee brought into near full extension while the cement hardened. Once the cement hardened, the spacer was removed and the 3 mm meniscal bearing insert was trialed. This demonstrated excellent tracking while the knee was placed through a range of motion, and showed no evidence towards subluxation or dislocation. In addition, it did not fit too tightly. Therefore, the permanent 3 mm meniscal bearing insert was snapped into position after verifying that no cement had been retained posteriorly. Again the knee was placed through a range of motion with the findings as described above.  The wound was copiously irrigated with sterile saline solution via the jet lavage system before the retinacular layer was reapproximated using #0 Vicryl interrupted sutures. The subcutaneous tissues were closed in two layers using 2-0 Vicryl interrupted sutures before the skin was closed using staples. A sterile occlusive dressing was applied to the knee before the patient was awakened. The patient was transferred back to his/her hospital bed and returned to the recovery room in  satisfactory condition after tolerating the procedure well. A Polar Care device was applied to the knee as well.

## 2023-06-01 NOTE — Anesthesia Postprocedure Evaluation (Signed)
Anesthesia Post Note  Patient: Victoria [name]  Procedure(s) Performed: Right unicondylar knee arthroplasty (Right: Knee)  Patient location during evaluation: PACU Anesthesia Type: Spinal Level of consciousness: awake and alert, oriented and patient cooperative Pain management: pain level controlled Vital Signs Assessment: post-procedure vital signs reviewed and stable Respiratory status: spontaneous breathing, nonlabored ventilation and respiratory function stable Cardiovascular status: blood pressure returned to baseline and stable Postop Assessment: adequate PO intake, no headache, no backache and spinal receding Anesthetic complications: no   No notable events documented.   Last Vitals:  Vitals:   06/01/23 1315 06/01/23 1330  BP: 123/64 130/64  Pulse: 76 74  Resp: 15 14  Temp:  (!) 36.2 C  SpO2: 100% 100%    Last Pain:  Vitals:   06/01/23 1330  TempSrc:   PainSc: 0-No pain                 Reed Breech

## 2023-06-01 NOTE — Evaluation (Signed)
Physical Therapy Evaluation Patient Details Name: Victoria [name] MRN: [id number] DOB: Dec 11, 1960 Today's Date: 06/01/2023  History of Present Illness  63 y/o female s/p R medial unicondular knee replacement.  Clinical Impression  Pt did well with POD0 PT session, she was having some post-op nausea and residual R LE numbness from the spinal but overall was able to perform well with expected exercises, mobility and gait training.  Family present and also educated on HEP, care transfers, polar care, progression/expectations, etc and all questions answered.  Pt was able to show adequate quad control and perform 10 SLRs, eager to get up and walk with some minimal initial R knee buckling that she self-arrested and ultimately was able to avoid with deliberate effort.  Pt did well with HEP/supine exercises and negotiated up/down steps w/o need for assist.  Pt will need continued PT per knee replacement protocols.        If plan is discharge home, recommend the following: A little help with walking and/or transfers;A little help with bathing/dressing/bathroom;Assistance with cooking/housework;Assist for transportation;Help with stairs or ramp for entrance   Can travel by private vehicle        Equipment Recommendations Rolling walker (2 wheels);BSC/3in1  Recommendations for Other Services       Functional Status Assessment Patient has had a recent decline in their functional status and demonstrates the ability to make significant improvements in function in a reasonable and predictable amount of time.     Precautions / Restrictions Precautions Precautions: Knee;Fall Restrictions Weight Bearing Restrictions Per Provider Order: Yes RLE Weight Bearing Per Provider Order: Weight bearing as tolerated      Mobility  Bed Mobility Overal bed mobility: Modified Independent             General bed mobility comments: easily able to get herself up to sitting EOB    Transfers Overall  transfer level: Modified independent Equipment used: Rolling walker (2 wheels)               General transfer comment: cuing for set up and positioning, able to rise from standard height bed w/o assist, expected UE use    Ambulation/Gait Ambulation/Gait assistance: Contact guard assist Gait Distance (Feet): 125 Feet Assistive device: Rolling walker (2 wheels)         General Gait Details: Pt with intial expected hesitancy and (despite much edu/cuing) did have a few small R knee buckling episodes that she easily self arrested with walker.  Pt with increased speed, confidence and R LE control with increased distance.  Stairs Stairs: Yes Stairs assistance: Contact guard assist Stair Management: One rail Right, Sideways Number of Stairs: 4 General stair comments: Pt able to negotiate steps safely and with confidence, family present and educated on strategies as well  Wheelchair Mobility     Tilt Bed    Modified Rankin (Stroke Patients Only)       Balance Overall balance assessment: Modified Independent                                           Pertinent Vitals/Pain Pain Assessment Pain Assessment: 0-10 Pain Score: 7  Pain Location: R knee    Home Living Family/patient expects to be discharged to:: Private residence Living Arrangements: Spouse/significant other;Children Available Help at Discharge: Family;Available 24 hours/day Type of Home: House Home Access: Stairs to enter Entrance Stairs-Rails: Right Entrance Stairs-Number of  Steps: 5   Home Layout: One level Home Equipment: None;Shower seat - built in;Shower seat      Prior Function Prior Level of Function : Independent/Modified Independent             Mobility Comments: recently limping but typically able to be on feet most of the day, walks regularly for exercise ADLs Comments: independent, working as a Social worker     Extremity/Trunk Assessment   Upper Extremity  Assessment Upper Extremity Assessment: Overall WFL for tasks assessed    Lower Extremity Assessment Lower Extremity Assessment: Overall WFL for tasks assessed (expected post-op weakness, mild numbness from block remains t/o session)       Communication   Communication Communication: No apparent difficulties  Cognition Arousal: Alert Behavior During Therapy: WFL for tasks assessed/performed Overall Cognitive Status: Within Functional Limits for tasks assessed                                          General Comments General comments (skin integrity, edema, etc.): Pt with mild but still present post-op numbness in R LE, showed adequate quad control    Exercises Total Joint Exercises Ankle Circles/Pumps: AROM, 10 reps Quad Sets: Strengthening, 10 reps Heel Slides: AROM, AAROM, 5 reps (with resisted leg ext) Hip ABduction/ADduction: Strengthening, 10 reps Straight Leg Raises: AROM, 10 reps Knee Flexion: PROM, 5 reps Goniometric ROM: 1-74   Assessment/Plan    PT Assessment Patient needs continued PT services  PT Problem List Decreased strength;Decreased range of motion;Decreased activity tolerance;Decreased balance;Decreased mobility;Decreased safety awareness;Decreased knowledge of use of DME;Pain       PT Treatment Interventions DME instruction;Gait training;Stair training;Functional mobility training;Therapeutic activities;Therapeutic exercise;Balance training;Neuromuscular re-education;Patient/family education    PT Goals (Current goals can be found in the Care Plan section)  Acute Rehab PT Goals Patient Stated Goal: go home today PT Goal Formulation: With patient/family Time For Goal Achievement: 06/14/23 Potential to Achieve Goals: Good    Frequency BID     Co-evaluation               AM-PAC PT "6 Clicks" Mobility  Outcome Measure Help needed turning from your back to your side while in a flat bed without using bedrails?: None Help  needed moving from lying on your back to sitting on the side of a flat bed without using bedrails?: None Help needed moving to and from a bed to a chair (including a wheelchair)?: None Help needed standing up from a chair using your arms (e.g., wheelchair or bedside chair)?: None Help needed to walk in hospital room?: A Little Help needed climbing 3-5 steps with a railing? : A Little 6 Click Score: 22    End of Session Equipment Utilized During Treatment: Gait belt Activity Tolerance: Patient tolerated treatment well Patient left: in chair;with nursing/sitter in room;with family/visitor present Nurse Communication: Mobility status;Need for lift equipment PT Visit Diagnosis: Muscle weakness (generalized) (M62.81);Difficulty in walking, not elsewhere classified (R26.2);Pain Pain - Right/Left: Right Pain - part of body: Knee    Time: 0865-7846 PT Time Calculation (min) (ACUTE ONLY): 53 min   Charges:   PT Evaluation $PT Eval Low Complexity: 1 Low PT Treatments $Gait Training: 8-22 mins $Therapeutic Exercise: 8-22 mins $Therapeutic Activity: 8-22 mins PT General Charges $$ ACUTE PT VISIT: 1 Visit         Malachi Pro, DPT 06/01/2023, 6:37 PM

## 2023-06-01 NOTE — Anesthesia Procedure Notes (Signed)
Spinal  Patient location during procedure: OR Start time: 06/01/2023 10:46 AM End time: 06/01/2023 10:48 AM Reason for block: surgical anesthesia Staffing Performed: resident/CRNA  Anesthesiologist: Reed Breech, MD Resident/CRNA: Stormy Fabian, CRNA Performed by: Stormy Fabian, CRNA Authorized by: Reed Breech, MD   Preanesthetic Checklist Completed: patient identified, IV checked, site marked, risks and benefits discussed, surgical consent, monitors and equipment checked, pre-op evaluation and timeout performed Spinal Block Patient position: sitting Prep: ChloraPrep Patient monitoring: heart rate, continuous pulse ox, blood pressure and cardiac monitor Approach: midline Location: L3-4 Injection technique: single-shot Needle Needle type: Whitacre and Introducer  Needle gauge: 24 G Needle length: 9 cm Assessment Sensory level: T10 Events: CSF return Additional Notes Sterile aseptic technique used throughout the procedure.  Negative paresthesia. Negative blood return. Positive free-flowing CSF. Expiration date of kit checked and confirmed. Patient tolerated procedure well, without complications.

## 2023-06-01 NOTE — Hospital Course (Signed)
Patient is not able to walk the distance required to go the bathroom, or he/she is unable to safely negotiate stairs required to access the bathroom.  A 3in1 BSC will alleviate this problem  

## 2023-06-01 NOTE — H&P (Signed)
History of Present Illness:  Victoria [name] is a 63 y.o. female who presents for follow-up of her medial sided right knee pain the patient was last seen for these symptoms 1 month ago. At this visit, she received a steroid injection which is a provided only limited benefit for less than a week before her symptoms recurred. Therefore, the patient was sent for an MRI scan and returns today to review these results. She continues to note moderate pain in her knee which she rates at 4/10 on today's visit. She localizes the pain to the medial aspect of her knee and notes that her symptoms are worse with any long standing or ambulation. She is ambulating without any assistive devices on today's visit, but notes that she is not able to walk far. She also is not able to reciprocate stairs. She denies any reinjury to the knee since her last visit, and denies any paresthesias down her leg to her foot.  Allergies:  Codeine (Nausea, Vomiting, and Rash)  Doxycycline (Nausea)   Past Medical History: No past medical history on file.  Past Surgical History:  KNEE ARTHROSCOPY   Past Family History: No family history on file.   Social History:   Socioeconomic History:  Marital status: Married  Tobacco Use  Smoking status: Never  Smokeless tobacco: Never   Social Drivers of Health:   Physicist, medical Strain: Low Risk (04/24/2023)  Overall Financial Resource Strain (CARDIA)  Difficulty of Paying Living Expenses: Not hard at all  Food Insecurity: No Food Insecurity (04/24/2023)  Hunger Vital Sign  Worried About Running Out of Food in the Last Year: Never true  Ran Out of Food in the Last Year: Never true  Transportation Needs: No Transportation Needs (04/24/2023)  PRAPARE - Risk analyst (Medical): No  Lack of Transportation (Non-Medical): No   Review of Systems:  A comprehensive 14 point ROS was performed, reviewed, and the pertinent orthopaedic findings are documented in the  HPI.  Physical Exam: Vitals:  05/29/23 0905 05/29/23 0906  BP: (!) 146/78  Weight: 94.6 kg (208 lb 9.6 oz)  Height: 160 cm (5\' 3" )  PainSc: 4 4  PainLoc: Knee Knee   General/Constitutional: The patient appears to be well-nourished, well-developed, and in no acute distress. Neuro/Psych: Normal mood and affect, oriented to person, place and time. Eyes: Non-icteric. Pupils are equal, round, and reactive to light, and exhibit synchronous movement. ENT: Unremarkable. Lymphatic: No palpable adenopathy. Respiratory: Lungs clear to auscultation, Normal chest excursion, No wheezes, and Non-labored breathing Cardiovascular: Regular rate and rhythm. No murmurs. and No edema, swelling or tenderness, except as noted in detailed exam. Integumentary: No impressive skin lesions present, except as noted in detailed exam. Musculoskeletal: Unremarkable, except as noted in detailed exam.  Right knee exam: GAIT: Patient ambulates with a moderate limp, favoring her right leg, but is not using any assistive devices. ALIGNMENT: Normal SKIN: Unremarkable SWELLING: Minimal EFFUSION: At most a trace WARMTH: None TENDERNESS: Moderate tenderness along medial joint line, but no lateral joint line tenderness ROM: 0 to 110 degrees with moderate pain in maximal flexion and mild pain in full extension McMURRAY'S: Positive PATELLOFEMORAL: Normal tracking with no peri-patellar tenderness and negative apprehension sign CREPITUS: None LACHMAN'S: Negative PIVOT SHIFT: Not evaluated ANTERIOR DRAWER: Negative POSTERIOR DRAWER: Negative VARUS/VALGUS: Stable  She remains grossly neurovascular intact to the right lower extremity and foot.  X-rays/MRI/Lab data:  Recent MRI scan of the right knee is available for review and has been reviewed by myself.  By report, the study demonstrates evidence of posterior root tear involving the medial meniscus with medial extrusion of the body of the medial meniscus. There also was  evidence of significant degenerative changes of the medial compartment with areas of full-thickness articular cartilage loss involving both the medial femoral condyle and medial tibial plateau. Mild fissuring of the articular cartilage along the femoral trochlea is noted, but the lateral compartment appears to be well-maintained. There is no ligamentous pathology. Both the films and report were reviewed by myself and discussed with the patient and her husband.  Assessment: 1. Primary osteoarthritis of right knee. 2. Complex tear of medial meniscus of right knee.    Plan: The treatment options were discussed with the patient and her husband. In addition, patient educational materials were provided regarding the diagnosis and treatment options. The patient is quite frustrated by her symptoms and functional limitations, and is ready to consider more aggressive treatment options. Therefore, I have recommended a surgical procedure, specifically a right partial knee replacement which would manage both the meniscal tear as well as the arthritic changes along the medial aspect of her knee. The procedure was discussed with the patient, as were the potential risks (including bleeding, infection, nerve and/or blood vessel injury, persistent or recurrent pain, loosening and/or failure of the components, dislocation, need for further surgery, blood clots, strokes, heart attacks and/or arhythmias, pneumonia, etc.) and benefits. The patient states his/her understanding and wishes to proceed. All of the patient's questions and concerns were answered. She can call any time with further concerns. She will follow up post-surgery, routine.    H&P reviewed and patient re-examined. No changes.

## 2023-06-01 NOTE — H&P (Signed)
Patient is not able to walk the distance required to go the bathroom, or he/she is unable to safely negotiate stairs required to access the bathroom.  A 3in1 BSC will alleviate this problem  

## 2023-06-01 NOTE — Discharge Instructions (Addendum)
Orthopedic discharge instructions: May shower with intact OpSite dressing. Apply ice frequently to knee or use Polar Care. Start Eliquis 1 tablet (2.5 mg) twice daily on Friday, 06/02/2023, for 2 weeks, then take aspirin 325 mg (or four 81 mg baby aspirin) twice daily for 4 weeks. Take pain medication as prescribed when needed.  May supplement with ES Tylenol if necessary. May weight-bear as tolerated on right leg - use walker for balance and support. Follow-up in 10-14 days or as scheduled.  POLAR CARE INFORMATION  MassAdvertisement.it  How to use Breg Polar Care [facility]?  YouTube   ShippingScam.co.uk  OPERATING INSTRUCTIONS  Start the product With dry hands, connect the transformer to the electrical connection located on the top of the cooler. Next, plug the transformer into an appropriate electrical outlet. The unit will automatically start running at this point.  To stop the pump, disconnect electrical power.  Unplug to stop the product when not in use. Unplugging the Polar Care unit turns it off. Always unplug immediately after use. Never leave it plugged in while unattended. Remove pad.    FIRST ADD WATER TO FILL LINE, THEN ICE---Replace ice when existing ice is almost melted  1 Discuss Treatment with your Licensed Health Care Practitioner and Use Only as Prescribed 2 Apply Insulation Barrier & Cold Therapy Pad 3 Check for Moisture 4 Inspect Skin Regularly  Tips and Trouble Shooting Usage Tips 1. Use cubed or chunked ice for optimal performance. 2. It is recommended to drain the Pad between uses. To drain the pad, hold the Pad upright with the hose pointed toward the ground. Depress the black plunger and allow water to drain out. 3. You may disconnect the Pad from the unit without removing the pad from the affected area by depressing the silver tabs on the hose coupling and gently pulling the hoses apart. The Pad and unit will seal itself  and will not leak. Note: Some dripping during release is normal. 4. DO NOT RUN PUMP WITHOUT WATER! The pump in this unit is designed to run with water. Running the unit without water will cause permanent damage to the pump. 5. Unplug unit before removing lid.  TROUBLESHOOTING GUIDE Pump not running, Water not flowing to the pad, Pad is not getting cold 1. Make sure the transformer is plugged into the wall outlet. 2. Confirm that the ice and water are filled to the indicated levels. 3. Make sure there are no kinks in the pad. 4. Gently pull on the blue tube to make sure the tube/pad junction is straight. 5. Remove the pad from the treatment site and ll it while the pad is lying at; then reapply. 6. Confirm that the pad couplings are securely attached to the unit. Listen for the double clicks (Figure 1) to confirm the pad couplings are securely attached.  Leaks    Note: Some condensation on the lines, controller, and pads is unavoidable, especially in warmer climates. 1. If using a Breg Polar Care Cold Therapy unit with a detachable Cold Therapy Pad, and a leak exists (other than condensation on the lines) disconnect the pad couplings. Make sure the silver tabs on the couplings are depressed before reconnecting the pad to the pump hose; then confirm both sides of the coupling are properly clicked in. 2. If the coupling continues to leak or a leak is detected in the pad itself, stop using it and call Breg Customer Care at [phone].  Cleaning After use, empty and dry  the unit with a soft cloth. Warm water and mild detergent may be used occasionally to clean the pump and tubes.  WARNING: The Polar Care Cube can be cold enough to cause serious injury, including full skin necrosis. Follow these Operating Instructions, and carefully read the Product Insert (see pouch on side of unit) and the Cold Therapy Pad Fitting Instructions (provided with each Cold Therapy Pad) prior to use.

## 2023-06-01 NOTE — Anesthesia Preprocedure Evaluation (Addendum)
Anesthesia Evaluation  Patient identified by MRN, date of birth, ID band Patient awake    Reviewed: Allergy & Precautions, NPO status , Patient's Chart, lab work & pertinent test results  History of Anesthesia Complications (+) PROLONGED EMERGENCE and history of anesthetic complications  Airway Mallampati: III   Neck ROM: Full    Dental  (+) Missing   Pulmonary neg pulmonary ROS   Pulmonary exam normal breath sounds clear to auscultation       Cardiovascular Exercise Tolerance: Good Normal cardiovascular exam Rhythm:Regular Rate:Normal  ECG 08/08/22: normal   Neuro/Psych negative neurological ROS     GI/Hepatic negative GI ROS,,,  Endo/Other  Obesity; prediabetes  Renal/GU negative Renal ROS     Musculoskeletal  (+) Arthritis ,    Abdominal   Peds  Hematology  (+) Blood dyscrasia, anemia   Anesthesia Other Findings   Reproductive/Obstetrics                             Anesthesia Physical Anesthesia Plan  ASA: 2  Anesthesia Plan: General and Spinal   Post-op Pain Management:    Induction: Intravenous  PONV Risk Score and Plan: 3 and Propofol infusion, TIVA, Treatment may vary due to age or medical condition and Ondansetron  Airway Management Planned: Natural Airway and Nasal Cannula  Additional Equipment:   Intra-op Plan:   Post-operative Plan:   Informed Consent: I have reviewed the patients History and Physical, chart, labs and discussed the procedure including the risks, benefits and alternatives for the proposed anesthesia with the patient or authorized representative who has indicated his/her understanding and acceptance.       Plan Discussed with: CRNA  Anesthesia Plan Comments: (Plan for spinal and GA with natural airway, LMA/GETA backup.  Patient consented for risks of anesthesia including but not limited to:  - adverse reactions to medications - damage to  eyes, teeth, lips or other oral mucosa - nerve damage due to positioning  - sore throat or hoarseness - headache, bleeding, infection, nerve damage 2/2 spinal - damage to heart, brain, nerves, lungs, other parts of body or loss of life  Informed patient about role of CRNA in peri- and intra-operative care.  Patient voiced understanding.)       Anesthesia Quick Evaluation

## 2023-06-01 NOTE — Progress Notes (Signed)
PT Cancellation Note  Patient Details Name: Victoria [name] MRN: [id number] DOB: October 24, 1960   Cancelled Treatment:    Reason Eval/Treat Not Completed: Medical issues which prohibited therapy (Consult received and chart reviewed. Patient with significant spinal effect still in place; very minimal/no active movement in bilat LEs necessary for safe mobilization.  Will re-attempt later this date, pending resolution of spinal.)  Judi Jaffe H. Manson Passey, PT, DPT, NCS 06/01/23, 3:48 PM [phone]

## 2023-06-02 ENCOUNTER — Encounter: Payer: Self-pay | Admitting: Surgery

## 2023-06-02 NOTE — Progress Notes (Signed)
Patient is not able to walk the distance required to go the bathroom, or he/she is unable to safely negotiate stairs required to access the bathroom.  A 3in1 BSC will alleviate this problem  

## 2023-06-02 NOTE — TOC Progression Note (Signed)
Transition of Care [facility]) - Progression Note    Patient Details  Name: Victoria [name] MRN: [id number] Date of Birth: [birth year]  Transition of Care [facility]) CM/SW Contact  Marlowe Sax, RN Phone Number: 06/02/2023, 4:24 PM  Clinical Narrative:     The Patient is not set up with [facility] services, I contacted [facility], they stated that they are not in network withher Ins, I contacted [facility], they are not in network with her ins, [facility] is not in network with the ins, [facility] is not in network with the insurance [facility] is not in network with the insurance, [facility] is not in network with the insurance I notified the patient and the physician that she will need to go to Outpatient rehab        Expected Discharge Plan and Services         Expected Discharge Date: 06/01/23                                     Social Determinants of Health (SDOH) Interventions SDOH Screenings   Food Insecurity: No Food Insecurity (04/24/2023)   Received from [facility]  Housing: Low Risk  (05/29/2023)   Received from [facility]  Transportation Needs: No Transportation Needs (04/24/2023)   Received from [facility]  Utilities: Not At Risk (04/24/2023)   Received from [facility]  Depression [phone]): Low Risk  (02/15/2023)  Financial Resource Strain: Low Risk  (04/24/2023)   Received from [facility]  Tobacco Use: Low Risk  (06/01/2023)    Readmission Risk Interventions     No data to display

## 2023-07-26 NOTE — Therapy (Addendum)
 OUTPATIENT PHYSICAL THERAPY  EVALUATION   Patient Name: Victoria [name] MRN: [id number] DOB:Jan 09, 1961, 63 y.o., female Today's Date: 07/27/2023  END OF SESSION:  PT End of Session - 07/27/23 1456     Visit Number 1    Number of Visits 20    Date for PT Re-Evaluation 10/05/23    Authorization Type [employer] 15% coinsurance - 60 visits    Progress Note Due on Visit 10    PT Start Time 1513    PT Stop Time 1550    PT Time Calculation (min) 37 min    Activity Tolerance Patient tolerated treatment well    Behavior During Therapy [facility] for tasks assessed/performed             Past Medical History:  Diagnosis Date   Acute bronchitis 02/15/2023   Allergic rhinitis    Anemia    Anemia, iron deficiency    Back pain    Gallbladder problem    Heavy menses    Hyperglycemia 02/22/2016   Hyperlipidemia    Lipoma    on L arm   Osteoarthritis of right knee 05/2023   Pre-diabetes    SOBOE (shortness of breath on exertion)    Uterine fibroid    Vitamin D deficiency    Past Surgical History:  Procedure Laterality Date   CESAREAN SECTION     [phone]   CHOLECYSTECTOMY  11/07/2011   ENDOMETRIAL ABLATION  2010   fibroids   PARTIAL KNEE ARTHROPLASTY Right 06/01/2023   Procedure: Right unicondylar knee arthroplasty;  Surgeon: Christena Flake, MD;  Location: [facility] ORS;  Service: Orthopedics;  Laterality: Right;   Pelvic U/S and transvaginal  04/27/2009   fibroids x 2   TONSILLECTOMY  1976   TUBAL LIGATION     Uterine ultrasound  06/1998   and endo biopsy- neg   Patient Active Problem List   Diagnosis Date Noted   Epigastric abdominal tenderness 08/08/2022   Situational anxiety 08/01/2022   Skin lesion 08/01/2022   Encounter for screening mammogram for breast cancer 07/27/2021   Herpes simplex 07/27/2021   Vitamin D deficiency 08/06/2020   At risk for osteoporosis 08/06/2020   History of iron deficiency 08/06/2020   Back pain 07/23/2020   SOBOE (shortness of breath on  exertion) 07/23/2020   Anemia 07/23/2020   At risk for heart disease 07/23/2020   H/O small bowel obstruction 08/28/2018   Prediabetes 06/01/2018   Panic attacks 02/22/2016   Encounter for routine gynecological examination 09/02/2014   Chest pain 11/20/2011   Special screening for malignant neoplasms, colon 06/08/2011   Hyperlipidemia 06/08/2011   Encounter for general adult medical examination with abnormal findings 06/01/2011   LIPOMA 12/30/2009   FIBROIDS, UTERUS 04/27/2009   Obesity 03/19/2007   ALLERGIC RHINITIS 02/27/2007    PCP: Judy Pimple MD  REFERRING PROVIDER: Christena Flake, MD  REFERRING DIAG: [phone] (ICD-10-CM) - Presence of right artificial knee joint M17.11 (ICD-10-CM) - Unilateral primary osteoarthritis, right knee  THERAPY DIAG:  Chronic pain of right knee  Muscle weakness (generalized)  Difficulty in walking, not elsewhere classified  Localized edema  Rationale for Evaluation and Treatment: Rehabilitation  ONSET DATE: 07/14/2023 Partial Rt TKA  SUBJECTIVE:   SUBJECTIVE STATEMENT: 07/14/2023 Partial Rt TKA.  Pt indicated increased pain when going back to work (went back last week).  Pt indicated doing fairly well but having some trouble with WB activity.  Pt indicated sleeping fine at this time.   Pt indicated trouble  for a shorter period of time prior to surgery.   PERTINENT HISTORY: Medical hx: anemia, back pain, hyperlipidemia, OA rt knee  PAIN:  NPRS scale: at worst 5/10, current 5/10 Pain location: Rt knee  Pain description: irritation Aggravating factors: prolonged WB activity, sitting prolonged Relieving factors: icing  PRECAUTIONS: None  WEIGHT BEARING RESTRICTIONS: No  FALLS:  Has patient fallen in last 6 months? No  LIVING ENVIRONMENT: Lives in: House/apartment Stairs: stairs to enter with railing both side:  7 steps.  Has second floor but not to bedroom.  Has following equipment at home: has walker, cane.    OCCUPATION:  work requires standing/walking.  Work 5-6hrs most of the week.   PLOF: Independent, walking.  Housework.   PATIENT GOALS: Reduce pain  OBJECTIVE:   PATIENT SURVEYS:  Patient-Specific Activity Scoring Scheme  "0" represents "unable to perform." "10" represents "able to perform at prior level. 0 1 2 3 4 5 6 7 8 9  10 (Date and Score)   Activity 07/27/2023    1. Putting shoes on  6    2. Walking at work  5    3. Sitting prolonged 5   4.    5.    Score 5.333    Total score = sum of the activity scores/number of activities Minimum detectable change (90%CI) for average score = 2 points Minimum detectable change (90%CI) for single activity score = 3 points  COGNITION: 07/27/2023 Overall cognitive status: WFL    SENSATION: 07/27/2023 Not tested  EDEMA:  07/27/2023 Localized edema Rt knee, lower leg.   MUSCLE LENGTH: 07/27/2023   POSTURE:  07/27/2023 No Significant postural limitations  PALPATION: 07/27/2023 Mild tenderness incision to light touch.   LOWER EXTREMITY ROM:   ROM Right 07/27/2023 Left 07/27/2023  Hip flexion    Hip extension    Hip abduction    Hip adduction    Hip internal rotation    Hip external rotation    Knee flexion 85 AROM in supine heel slide   Knee extension -8 AROM in seated LAQ   Ankle dorsiflexion    Ankle plantarflexion    Ankle inversion    Ankle eversion     (Blank rows = not tested)  LOWER EXTREMITY MMT:  MMT Right 07/27/2023 Left 07/27/2023  Hip flexion 4/5 5/5  Hip extension    Hip abduction    Hip adduction    Hip internal rotation    Hip external rotation    Knee flexion 5/5   Knee extension 4/5 5/5  Ankle dorsiflexion 5/5 5/5  Ankle plantarflexion    Ankle inversion    Ankle eversion     (Blank rows = not tested)  LOWER EXTREMITY SPECIAL TESTS:  07/27/2023 No specific testing  FUNCTIONAL TESTS:  07/27/2023 18 inch chair transfer:  hands on knees  Lt SLS: 12 seconds Rt SLS: 4  seconds  GAIT: 07/27/2023 Independent ambulation with reduced knee flexion in swing and mild decreased in stance on Rt leg.  TODAY'S TREATMENT                                                                          DATE: 07/27/2023 Therex:    HEP instruction/performance c cues for techniques, handout provided.  Trial set performed of each for comprehension and symptom assessment.  See below for exercise list Nustep Lvl 5 8 mins UE/LE for ROM  Manual Seated Rt knee flexion c IR/distraction mobilization with movement.   PATIENT EDUCATION:  07/27/2023 Education details: HEP, POC Person educated: Patient Education method: Programmer, multimedia, Demonstration, Verbal cues, and Handouts Education comprehension: verbalized understanding, returned demonstration, and verbal cues required  HOME EXERCISE PROGRAM: Access Code: [email or web address] URL: [email or web address] Date: 07/27/2023 Prepared by: Chyrel Masson  Exercises - Sit to Stand  - 3 x daily - 7 x weekly - 1 sets - 10 reps - Supine Heel Slide  - 3-5 x daily - 7 x weekly - 1 sets - 10 reps - 5 hold - Supine Heel Slide with Strap (Mirrored)  - 3-5 x daily - 7 x weekly - 1 sets - 10 reps - 5 hold - Supine Quadricep Sets  - 3-5 x daily - 7 x weekly - 1 sets - 10 reps - 5 hold - Seated Long Arc Quad  - 3-5 x daily - 7 x weekly - 1 sets - 5-10 reps - 2 hold - Seated Quad Set  - 3-5 x daily - 7 x weekly - 1 sets - 10 reps - 5 hold  ASSESSMENT:  CLINICAL IMPRESSION: Patient is a 63 y.o. who comes to clinic with complaints of Rt knee pain s/p partial TKA with mobility, strength and movement coordination deficits that impair their ability to perform usual daily and recreational functional activities without increase difficulty/symptoms at this time.  Patient to benefit from  skilled PT services to address impairments and limitations to improve to previous level of function without restriction secondary to condition.   OBJECTIVE IMPAIRMENTS: Abnormal gait, decreased activity tolerance, decreased balance, decreased coordination, decreased endurance, decreased mobility, difficulty walking, decreased ROM, decreased strength, hypomobility, increased edema, increased fascial restrictions, impaired perceived functional ability, increased muscle spasms, impaired flexibility, improper body mechanics, and pain.   ACTIVITY LIMITATIONS: lifting, bending, sitting, standing, squatting, stairs, transfers, and locomotion level  PARTICIPATION LIMITATIONS: meal prep, cleaning, laundry, interpersonal relationship, driving, shopping, community activity, and occupation  PERSONAL FACTORS:  Medical hx: anemia, back pain, hyperlipidemia, OA rt knee  are also affecting patient's functional outcome.   REHAB POTENTIAL: Good  CLINICAL DECISION MAKING: Stable/uncomplicated  EVALUATION COMPLEXITY: Low   GOALS: Goals reviewed with patient? Yes  SHORT TERM GOALS: (target date for Short term goals are 3 weeks 08/17/2023)   1.  Patient will demonstrate independent use of home exercise program to maintain progress from in clinic treatments.  Goal status: New  LONG TERM GOALS: (target dates for all long term goals are 10 weeks  10/05/2023 )   1. Patient will demonstrate/report pain at worst less than or equal to 2/10 to facilitate minimal limitation in daily activity secondary to pain symptoms.  Goal status: New   2. Patient will demonstrate independent use of home exercise program to facilitate ability to maintain/progress functional gains from skilled  physical therapy services.  Goal status: New   3. Patient will demonstrate Patient specific functional scale avg > or = 8/10 to indicate reduced disability due to condition.   Goal status: New   4.  Patient will demonstrate Rt LE MMT  5/5 throughout to faciltiate usual transfers, stairs, squatting at [facility] for daily life.   Goal status: New   5.  Patient will demonstrate Rt knee 0-110 deg to facilitate transfers, squatting, and ambulation at PLOF s deviation.  Goal status: New   6.  Patient will demonstrate bilateral SLS > 15 seconds to facilitate stability in ambulation on even and uneven surfaces.  Goal status: New   7.  Patient will demonstrate ascending/descending stairs reciprocally s UE assist for community integration.   Goal Status: New   PLAN:  PT FREQUENCY: 1-2x/week  PT DURATION: 10 weeks  PLANNED INTERVENTIONS: Can include 88416- PT Re-evaluation, 97110-Therapeutic exercises, 97530- Therapeutic activity, 97112- Neuromuscular re-education, 97535- Self Care, 97140- Manual therapy, [phone]- Gait training, [phone]- Orthotic Fit/training, [phone]- Canalith repositioning, [id number]- Aquatic Therapy, [phone]- Electrical stimulation (unattended), [phone]- Electrical stimulation (manual), [id number] Physical performance testing, 97016- Vasopneumatic device, [id number]- Ultrasound, [id number]- Traction (mechanical), [id number]- Ionotophoresis 4mg /ml Dexamethasone, Patient/Family education, Balance training, Stair training, Taping, Dry Needling, Joint mobilization, Joint manipulation, Spinal manipulation, Spinal mobilization, Scar mobilization, Vestibular training, Visual/preceptual remediation/compensation, DME instructions, Cryotherapy, and Moist heat.  All performed as medically necessary.  All included unless contraindicated  PLAN FOR NEXT SESSION: Review HEP knowledge/results.  Flexion mobility gains, quad strengthening.    Chyrel Masson, PT, DPT, OCS, ATC 07/27/23  3:54 PM

## 2023-07-27 ENCOUNTER — Ambulatory Visit (INDEPENDENT_AMBULATORY_CARE_PROVIDER_SITE_OTHER): Admitting: Rehabilitative and Restorative Service Providers"

## 2023-07-27 ENCOUNTER — Encounter: Payer: Self-pay | Admitting: Rehabilitative and Restorative Service Providers"

## 2023-07-27 DIAGNOSIS — M25561 Pain in right knee: Secondary | ICD-10-CM | POA: Diagnosis not present

## 2023-07-27 DIAGNOSIS — R600 Localized edema: Secondary | ICD-10-CM

## 2023-07-27 DIAGNOSIS — R262 Difficulty in walking, not elsewhere classified: Secondary | ICD-10-CM

## 2023-07-27 DIAGNOSIS — M6281 Muscle weakness (generalized): Secondary | ICD-10-CM | POA: Diagnosis not present

## 2023-07-27 DIAGNOSIS — G8929 Other chronic pain: Secondary | ICD-10-CM

## 2023-08-01 ENCOUNTER — Ambulatory Visit (INDEPENDENT_AMBULATORY_CARE_PROVIDER_SITE_OTHER): Admitting: Physical Therapy

## 2023-08-01 ENCOUNTER — Encounter: Payer: Self-pay | Admitting: Physical Therapy

## 2023-08-01 DIAGNOSIS — M25561 Pain in right knee: Secondary | ICD-10-CM

## 2023-08-01 DIAGNOSIS — R262 Difficulty in walking, not elsewhere classified: Secondary | ICD-10-CM | POA: Diagnosis not present

## 2023-08-01 DIAGNOSIS — M6281 Muscle weakness (generalized): Secondary | ICD-10-CM

## 2023-08-01 DIAGNOSIS — G8929 Other chronic pain: Secondary | ICD-10-CM

## 2023-08-01 DIAGNOSIS — R600 Localized edema: Secondary | ICD-10-CM

## 2023-08-01 NOTE — Therapy (Addendum)
 OUTPATIENT PHYSICAL THERAPY    Patient Name: Victoria [name] MRN: [id number] DOB:1960/07/05, 63 y.o., female Today's Date: 08/01/2023  END OF SESSION:  PT End of Session - 08/01/23 1555     Visit Number 2    Number of Visits 20    Date for PT Re-Evaluation 10/05/23    Progress Note Due on Visit 10    PT Start Time 1515    PT Stop Time 1600    PT Time Calculation (min) 45 min    Activity Tolerance Patient tolerated treatment well    Behavior During Therapy [facility] for tasks assessed/performed              Past Medical History:  Diagnosis Date   Acute bronchitis 02/15/2023   Allergic rhinitis    Anemia    Anemia, iron deficiency    Back pain    Gallbladder problem    Heavy menses    Hyperglycemia 02/22/2016   Hyperlipidemia    Lipoma    on L arm   Osteoarthritis of right knee 05/2023   Pre-diabetes    SOBOE (shortness of breath on exertion)    Uterine fibroid    Vitamin D deficiency    Past Surgical History:  Procedure Laterality Date   CESAREAN SECTION     [phone]   CHOLECYSTECTOMY  11/07/2011   ENDOMETRIAL ABLATION  2010   fibroids   PARTIAL KNEE ARTHROPLASTY Right 06/01/2023   Procedure: Right unicondylar knee arthroplasty;  Surgeon: Christena Flake, MD;  Location: [facility] ORS;  Service: Orthopedics;  Laterality: Right;   Pelvic U/S and transvaginal  04/27/2009   fibroids x 2   TONSILLECTOMY  1976   TUBAL LIGATION     Uterine ultrasound  06/1998   and endo biopsy- neg   Patient Active Problem List   Diagnosis Date Noted   Epigastric abdominal tenderness 08/08/2022   Situational anxiety 08/01/2022   Skin lesion 08/01/2022   Encounter for screening mammogram for breast cancer 07/27/2021   Herpes simplex 07/27/2021   Vitamin D deficiency 08/06/2020   At risk for osteoporosis 08/06/2020   History of iron deficiency 08/06/2020   Back pain 07/23/2020   SOBOE (shortness of breath on exertion) 07/23/2020   Anemia 07/23/2020   At risk for heart disease  07/23/2020   H/O small bowel obstruction 08/28/2018   Prediabetes 06/01/2018   Panic attacks 02/22/2016   Encounter for routine gynecological examination 09/02/2014   Chest pain 11/20/2011   Special screening for malignant neoplasms, colon 06/08/2011   Hyperlipidemia 06/08/2011   Encounter for general adult medical examination with abnormal findings 06/01/2011   LIPOMA 12/30/2009   FIBROIDS, UTERUS 04/27/2009   Obesity 03/19/2007   ALLERGIC RHINITIS 02/27/2007    PCP: Judy Pimple MD  REFERRING PROVIDER: Christena Flake, MD  REFERRING DIAG: [phone] (ICD-10-CM) - Presence of right artificial knee joint M17.11 (ICD-10-CM) - Unilateral primary osteoarthritis, right knee  THERAPY DIAG:  Chronic pain of right knee  Muscle weakness (generalized)  Difficulty in walking, not elsewhere classified  Localized edema  Rationale for Evaluation and Treatment: Rehabilitation  ONSET DATE: 07/14/2023 Partial Rt TKA  SUBJECTIVE:   SUBJECTIVE STATEMENT: Pt arriving today reporting 4/10 pain in her Rt knee. Pt stating she worked 6 hours already this morning.   PERTINENT HISTORY: Medical hx: anemia, back pain, hyperlipidemia, OA rt knee  PAIN:  NPRS scale: upon arrival after working 6 hours 4/10, Pain location: Rt knee  Pain description: irritation Aggravating factors: prolonged WB  activity, sitting prolonged Relieving factors: icing  PRECAUTIONS: None  WEIGHT BEARING RESTRICTIONS: No  FALLS:  Has patient fallen in last 6 months? No  LIVING ENVIRONMENT: Lives in: House/apartment Stairs: stairs to enter with railing both side:  7 steps.  Has second floor but not to bedroom.  Has following equipment at home: has walker, cane.    OCCUPATION: work requires standing/walking.  Work 5-6hrs most of the week.   PLOF: Independent, walking.  Housework.   PATIENT GOALS: Reduce pain  OBJECTIVE:   PATIENT SURVEYS:  Patient-Specific Activity Scoring Scheme  "0" represents "unable to  perform." "10" represents "able to perform at prior level. 0 1 2 3 4 5 6 7 8 9  10 (Date and Score)   Activity 07/27/2023    1. Putting shoes on  6    2. Walking at work  5    3. Sitting prolonged 5   4.    5.    Score 5.333    Total score = sum of the activity scores/number of activities Minimum detectable change (90%CI) for average score = 2 points Minimum detectable change (90%CI) for single activity score = 3 points  COGNITION: 07/27/2023 Overall cognitive status: WFL    SENSATION: 07/27/2023 Not tested  EDEMA:  07/27/2023 Localized edema Rt knee, lower leg.   MUSCLE LENGTH: 07/27/2023   POSTURE:  07/27/2023 No Significant postural limitations  PALPATION: 07/27/2023 Mild tenderness incision to light touch.   LOWER EXTREMITY ROM:   ROM A: active, P: passive Right 07/27/2023 Left 07/27/23 Rt 08/01/23  Hip flexion     Hip extension     Hip abduction     Hip adduction     Hip internal rotation     Hip external rotation     Knee flexion 85 AROM in supine heel slide  A: 80 P: 86 Supine heel slide  Knee extension -8 AROM in seated LAQ    Ankle dorsiflexion     Ankle plantarflexion     Ankle inversion     Ankle eversion      (Blank rows = not tested)  LOWER EXTREMITY MMT:  MMT Right 07/27/2023 Left 07/27/2023  Hip flexion 4/5 5/5  Hip extension    Hip abduction    Hip adduction    Hip internal rotation    Hip external rotation    Knee flexion 5/5   Knee extension 4/5 5/5  Ankle dorsiflexion 5/5 5/5  Ankle plantarflexion    Ankle inversion    Ankle eversion     (Blank rows = not tested)  LOWER EXTREMITY SPECIAL TESTS:  07/27/2023 No specific testing  FUNCTIONAL TESTS:  07/27/2023 18 inch chair transfer:  hands on knees  Lt SLS: 12 seconds Rt SLS: 4 seconds  GAIT: 07/27/2023 Independent ambulation with reduced knee flexion in swing and mild decreased in stance on Rt leg.  TODAY'S TREATMENT                                                                          DATE: 08/01/2023 Therex: Nustep: Level 5 x 7 minutes  Slant board: x 3 holding 20 sec Calf raises: x 20  LAQ: 2 x 10 Rt LE Seated SLR 2 x 10  Supine heel slides with strap and ball x 15 holding 5 sec TherActivities:  Step ups on 6 inch step x 15 c UE support Lateral step ups: 6 inch step x 10 (up and over) Sit to stand 2 x 10 Manual  Knee flexion supine PROM Knee flexion in sitting PROM Modalities:  Vasopneumtic device x 10 minutes, medium compression      TODAY'S TREATMENT                                                                          DATE: 07/27/2023 Therex:    HEP instruction/performance c cues for techniques, handout provided.  Trial set performed of each for comprehension and symptom assessment.  See below for exercise list Nustep Lvl 5 8 mins UE/LE for ROM  Manual Seated Rt knee flexion c IR/distraction mobilization with movement.   PATIENT EDUCATION:  07/27/2023 Education details: HEP, POC Person educated: Patient Education method: Programmer, multimedia, Demonstration, Verbal cues, and Handouts Education comprehension: verbalized understanding, returned demonstration, and verbal cues required  HOME EXERCISE PROGRAM: Access Code: [email or web address] URL: [email or web address] Date: 07/27/2023 Prepared by: Chyrel Masson  Exercises - Sit to Stand  - 3 x daily - 7 x weekly - 1 sets - 10 reps - Supine Heel Slide  - 3-5 x daily - 7 x weekly - 1 sets - 10 reps - 5 hold - Supine Heel Slide with Strap (Mirrored)  - 3-5 x daily - 7 x weekly - 1 sets - 10 reps - 5 hold - Supine Quadricep Sets  - 3-5 x daily - 7 x weekly - 1 sets - 10 reps - 5 hold - Seated Long Arc Quad  - 3-5 x daily - 7 x weekly - 1 sets - 5-10 reps - 2 hold - Seated Quad Set  - 3-5 x daily - 7 x weekly - 1 sets - 10  reps - 5 hold  ASSESSMENT:  CLINICAL IMPRESSION: Pt arriving reporting 4/10 pain in her Rt knee after working for 6 hours. Pt able to tolerate all exercises during session. Pt encouraged pt to focus on knee flexion. Continue skilled PT interventions.   OBJECTIVE IMPAIRMENTS: Abnormal gait, decreased activity tolerance, decreased balance, decreased coordination, decreased endurance, decreased mobility, difficulty walking, decreased ROM, decreased strength, hypomobility, increased edema, increased fascial restrictions, impaired perceived functional ability, increased muscle spasms, impaired flexibility, improper body mechanics, and pain.   ACTIVITY LIMITATIONS: lifting, bending, sitting, standing, squatting, stairs, transfers, and locomotion level  PARTICIPATION LIMITATIONS: meal prep, cleaning, laundry, interpersonal relationship, driving, shopping, community activity, and occupation  PERSONAL FACTORS:  Medical hx:  anemia, back pain, hyperlipidemia, OA rt knee  are also affecting patient's functional outcome.   REHAB POTENTIAL: Good  CLINICAL DECISION MAKING: Stable/uncomplicated  EVALUATION COMPLEXITY: Low   GOALS: Goals reviewed with patient? Yes  SHORT TERM GOALS: (target date for Short term goals are 3 weeks 08/17/2023)   1.  Patient will demonstrate independent use of home exercise program to maintain progress from in clinic treatments.  Goal status: New  LONG TERM GOALS: (target dates for all long term goals are 10 weeks  10/05/2023 )   1. Patient will demonstrate/report pain at worst less than or equal to 2/10 to facilitate minimal limitation in daily activity secondary to pain symptoms.  Goal status: New   2. Patient will demonstrate independent use of home exercise program to facilitate ability to maintain/progress functional gains from skilled physical therapy services.  Goal status: New   3. Patient will demonstrate Patient specific functional scale avg > or = 8/10 to  indicate reduced disability due to condition.   Goal status: New   4.  Patient will demonstrate Rt LE MMT 5/5 throughout to faciltiate usual transfers, stairs, squatting at [facility] for daily life.   Goal status: New   5.  Patient will demonstrate Rt knee 0-110 deg to facilitate transfers, squatting, and ambulation at PLOF s deviation.  Goal status: New   6.  Patient will demonstrate bilateral SLS > 15 seconds to facilitate stability in ambulation on even and uneven surfaces.  Goal status: New   7.  Patient will demonstrate ascending/descending stairs reciprocally s UE assist for community integration.   Goal Status: New   PLAN:  PT FREQUENCY: 1-2x/week  PT DURATION: 10 weeks  PLANNED INTERVENTIONS: Can include 16109- PT Re-evaluation, 97110-Therapeutic exercises, 97530- Therapeutic activity, 97112- Neuromuscular re-education, 97535- Self Care, 97140- Manual therapy, [phone]- Gait training, [phone]- Orthotic Fit/training, [phone]- Canalith repositioning, [id number]- Aquatic Therapy, [phone]- Electrical stimulation (unattended), [phone]- Electrical stimulation (manual), [id number] Physical performance testing, 97016- Vasopneumatic device, [id number]- Ultrasound, [id number]- Traction (mechanical), [id number]- Ionotophoresis 4mg /ml Dexamethasone, Patient/Family education, Balance training, Stair training, Taping, Dry Needling, Joint mobilization, Joint manipulation, Spinal manipulation, Spinal mobilization, Scar mobilization, Vestibular training, Visual/preceptual remediation/compensation, DME instructions, Cryotherapy, and Moist heat.  All performed as medically necessary.  All included unless contraindicated  PLAN FOR NEXT SESSION: Review HEP knowledge/results.  Flexion mobility gains, quad strengthening.    Narda Amber, PT MPT 08/01/23 3:56 PM   08/01/23  3:56 PM

## 2023-08-06 ENCOUNTER — Telehealth: Payer: Self-pay | Admitting: Family Medicine

## 2023-08-06 DIAGNOSIS — E559 Vitamin D deficiency, unspecified: Secondary | ICD-10-CM

## 2023-08-06 DIAGNOSIS — R7303 Prediabetes: Secondary | ICD-10-CM

## 2023-08-06 DIAGNOSIS — Z8639 Personal history of other endocrine, nutritional and metabolic disease: Secondary | ICD-10-CM

## 2023-08-06 DIAGNOSIS — D649 Anemia, unspecified: Secondary | ICD-10-CM

## 2023-08-06 DIAGNOSIS — E7849 Other hyperlipidemia: Secondary | ICD-10-CM

## 2023-08-06 NOTE — Telephone Encounter (Signed)
-----   Message from Lovena Neighbours sent at 07/27/2023  2:10 PM EDT ----- Regarding: Labs for Monday 3.24.25 Please put physical lab orders in future. Thank you, Denny Peon

## 2023-08-07 ENCOUNTER — Ambulatory Visit (INDEPENDENT_AMBULATORY_CARE_PROVIDER_SITE_OTHER): Admitting: Rehabilitative and Restorative Service Providers"

## 2023-08-07 ENCOUNTER — Other Ambulatory Visit (INDEPENDENT_AMBULATORY_CARE_PROVIDER_SITE_OTHER): Payer: Self-pay

## 2023-08-07 ENCOUNTER — Encounter: Payer: Self-pay | Admitting: Rehabilitative and Restorative Service Providers"

## 2023-08-07 DIAGNOSIS — E7849 Other hyperlipidemia: Secondary | ICD-10-CM | POA: Diagnosis not present

## 2023-08-07 DIAGNOSIS — D649 Anemia, unspecified: Secondary | ICD-10-CM

## 2023-08-07 DIAGNOSIS — R262 Difficulty in walking, not elsewhere classified: Secondary | ICD-10-CM

## 2023-08-07 DIAGNOSIS — Z8639 Personal history of other endocrine, nutritional and metabolic disease: Secondary | ICD-10-CM

## 2023-08-07 DIAGNOSIS — R7303 Prediabetes: Secondary | ICD-10-CM | POA: Diagnosis not present

## 2023-08-07 DIAGNOSIS — M25561 Pain in right knee: Secondary | ICD-10-CM | POA: Diagnosis not present

## 2023-08-07 DIAGNOSIS — G8929 Other chronic pain: Secondary | ICD-10-CM

## 2023-08-07 DIAGNOSIS — E559 Vitamin D deficiency, unspecified: Secondary | ICD-10-CM | POA: Diagnosis not present

## 2023-08-07 DIAGNOSIS — R600 Localized edema: Secondary | ICD-10-CM

## 2023-08-07 DIAGNOSIS — M6281 Muscle weakness (generalized): Secondary | ICD-10-CM | POA: Diagnosis not present

## 2023-08-07 LAB — TSH: TSH: 2.59 u[IU]/mL (ref 0.35–5.50)

## 2023-08-07 LAB — CBC WITH DIFFERENTIAL/PLATELET
Basophils Absolute: 0.1 10*3/uL (ref 0.0–0.1)
Basophils Relative: 1 % (ref 0.0–3.0)
Eosinophils Absolute: 0.1 10*3/uL (ref 0.0–0.7)
Eosinophils Relative: 1.1 % (ref 0.0–5.0)
HCT: 41.6 % (ref 36.0–46.0)
Hemoglobin: 13.9 g/dL (ref 12.0–15.0)
Lymphocytes Relative: 25.4 % (ref 12.0–46.0)
Lymphs Abs: 1.6 10*3/uL (ref 0.7–4.0)
MCHC: 33.5 g/dL (ref 30.0–36.0)
MCV: 85.2 fl (ref 78.0–100.0)
Monocytes Absolute: 0.3 10*3/uL (ref 0.1–1.0)
Monocytes Relative: 5.2 % (ref 3.0–12.0)
Neutro Abs: 4.3 10*3/uL (ref 1.4–7.7)
Neutrophils Relative %: 67.3 % (ref 43.0–77.0)
Platelets: 293 10*3/uL (ref 150.0–400.0)
RBC: 4.88 Mil/uL (ref 3.87–5.11)
RDW: 13.4 % (ref 11.5–15.5)
WBC: 6.3 10*3/uL (ref 4.0–10.5)

## 2023-08-07 LAB — COMPREHENSIVE METABOLIC PANEL
ALT: 18 U/L (ref 0–35)
AST: 15 U/L (ref 0–37)
Albumin: 4.5 g/dL (ref 3.5–5.2)
Alkaline Phosphatase: 57 U/L (ref 39–117)
BUN: 11 mg/dL (ref 6–23)
CO2: 28 meq/L (ref 19–32)
Calcium: 9.5 mg/dL (ref 8.4–10.5)
Chloride: 104 meq/L (ref 96–112)
Creatinine, Ser: 0.67 mg/dL (ref 0.40–1.20)
GFR: 93.68 mL/min (ref >60.00)
Glucose, Bld: 102 mg/dL — ABNORMAL HIGH (ref 70–99)
Potassium: 4 meq/L (ref 3.5–5.1)
Sodium: 140 meq/L (ref 135–145)
Total Bilirubin: 0.5 mg/dL (ref 0.2–1.2)
Total Protein: 6.7 g/dL (ref 6.0–8.3)

## 2023-08-07 LAB — VITAMIN D 25 HYDROXY (VIT D DEFICIENCY, FRACTURES): VITD: 43.94 ng/mL (ref 30.00–100.00)

## 2023-08-07 LAB — LIPID PANEL
Cholesterol: 213 mg/dL — ABNORMAL HIGH (ref 0–200)
HDL: 46.2 mg/dL (ref >39.00)
LDL Cholesterol: 143 mg/dL — ABNORMAL HIGH (ref 0–99)
NonHDL: 166.65
Total CHOL/HDL Ratio: 5
Triglycerides: 118 mg/dL (ref 0.0–149.0)
VLDL: 23.6 mg/dL (ref 0.0–40.0)

## 2023-08-07 LAB — HEMOGLOBIN A1C: Hgb A1c MFr Bld: 5.9 % (ref 4.6–6.5)

## 2023-08-07 NOTE — Therapy (Signed)
 OUTPATIENT PHYSICAL THERAPY    Patient Name: Victoria [name] MRN: [id number] DOB:[birth year], 63 y.o., female Today's Date: 08/07/2023  END OF SESSION:  PT End of Session - 08/07/23 1143     Visit Number 3    Number of Visits 20    Date for PT Re-Evaluation 10/05/23    Progress Note Due on Visit 10    PT Start Time 1141    PT Stop Time 1229    PT Time Calculation (min) 48 min    Activity Tolerance Patient tolerated treatment well    Behavior During Therapy [facility] for tasks assessed/performed               Past Medical History:  Diagnosis Date   Acute bronchitis 02/15/2023   Allergic rhinitis    Anemia    Anemia, iron deficiency    Back pain    Gallbladder problem    Heavy menses    Hyperglycemia 02/22/2016   Hyperlipidemia    Lipoma    on L arm   Osteoarthritis of right knee 05/2023   Pre-diabetes    SOBOE (shortness of breath on exertion)    Uterine fibroid    Vitamin D deficiency    Past Surgical History:  Procedure Laterality Date   CESAREAN SECTION     [phone]   CHOLECYSTECTOMY  11/07/2011   ENDOMETRIAL ABLATION  2010   fibroids   PARTIAL KNEE ARTHROPLASTY Right 06/01/2023   Procedure: Right unicondylar knee arthroplasty;  Surgeon: Christena Flake, MD;  Location: [facility] ORS;  Service: Orthopedics;  Laterality: Right;   Pelvic U/S and transvaginal  04/27/2009   fibroids x 2   TONSILLECTOMY  1976   TUBAL LIGATION     Uterine ultrasound  06/1998   and endo biopsy- neg   Patient Active Problem List   Diagnosis Date Noted   Epigastric abdominal tenderness 08/08/2022   Situational anxiety 08/01/2022   Skin lesion 08/01/2022   Encounter for screening mammogram for breast cancer 07/27/2021   Herpes simplex 07/27/2021   Vitamin D deficiency 08/06/2020   At risk for osteoporosis 08/06/2020   History of iron deficiency 08/06/2020   Back pain 07/23/2020   SOBOE (shortness of breath on exertion) 07/23/2020   Anemia 07/23/2020   At risk for heart disease  07/23/2020   H/O small bowel obstruction 08/28/2018   Prediabetes 06/01/2018   Panic attacks 02/22/2016   Encounter for routine gynecological examination 09/02/2014   Chest pain 11/20/2011   Special screening for malignant neoplasms, colon 06/08/2011   Hyperlipidemia 06/08/2011   Encounter for general adult medical examination with abnormal findings 06/01/2011   LIPOMA 12/30/2009   FIBROIDS, UTERUS 04/27/2009   Obesity 03/19/2007   ALLERGIC RHINITIS 02/27/2007    PCP: Judy Pimple MD  REFERRING PROVIDER: Christena Flake, MD  REFERRING DIAG: [phone] (ICD-10-CM) - Presence of right artificial knee joint M17.11 (ICD-10-CM) - Unilateral primary osteoarthritis, right knee  THERAPY DIAG:  Chronic pain of right knee  Muscle weakness (generalized)  Difficulty in walking, not elsewhere classified  Localized edema  Rationale for Evaluation and Treatment: Rehabilitation  ONSET DATE: 07/14/2023 Partial Rt TKA  SUBJECTIVE:   SUBJECTIVE STATEMENT: Pt indicated weather getting her some today.  Reported soreness after last visit and this morning with weather.    PERTINENT HISTORY: Medical hx: anemia, back pain, hyperlipidemia, OA rt knee  PAIN:  NPRS scale:  4/10 upon arrival  Pain location: Rt knee  Pain description: irritation Aggravating factors: prolonged WB activity,  sitting prolonged Relieving factors: icing  PRECAUTIONS: None  WEIGHT BEARING RESTRICTIONS: No  FALLS:  Has patient fallen in last 6 months? No  LIVING ENVIRONMENT: Lives in: House/apartment Stairs: stairs to enter with railing both side:  7 steps.  Has second floor but not to bedroom.  Has following equipment at home: has walker, cane.    OCCUPATION: work requires standing/walking.  Work 5-6hrs most of the week.   PLOF: Independent, walking.  Housework.   PATIENT GOALS: Reduce pain  OBJECTIVE:   PATIENT SURVEYS:  Patient-Specific Activity Scoring Scheme  "0" represents "unable to perform."  "10" represents "able to perform at prior level. 0 1 2 3 4 5 6 7 8 9  10 (Date and Score)   Activity 07/27/2023    1. Putting shoes on  6    2. Walking at work  5    3. Sitting prolonged 5   4.    5.    Score 5.333    Total score = sum of the activity scores/number of activities Minimum detectable change (90%CI) for average score = 2 points Minimum detectable change (90%CI) for single activity score = 3 points  COGNITION: 07/27/2023 Overall cognitive status: WFL    SENSATION: 07/27/2023 Not tested  EDEMA:  07/27/2023 Localized edema Rt knee, lower leg.   MUSCLE LENGTH: 07/27/2023   POSTURE:  07/27/2023 No Significant postural limitations  PALPATION: 07/27/2023 Mild tenderness incision to light touch.   LOWER EXTREMITY ROM:   ROM A: active, P: passive Right 07/27/2023 Left 07/27/23 Rt 08/01/23 Right 08/07/2023  Hip flexion      Hip extension      Hip abduction      Hip adduction      Hip internal rotation      Hip external rotation      Knee flexion 85 AROM in supine heel slide  A: 80 P: 86 Supine heel slide AROM in supine heel slide 90  Knee extension -8 AROM in seated LAQ     Ankle dorsiflexion      Ankle plantarflexion      Ankle inversion      Ankle eversion       (Blank rows = not tested)  LOWER EXTREMITY MMT:  MMT Right 07/27/2023 Left 07/27/2023  Hip flexion 4/5 5/5  Hip extension    Hip abduction    Hip adduction    Hip internal rotation    Hip external rotation    Knee flexion 5/5   Knee extension 4/5 5/5  Ankle dorsiflexion 5/5 5/5  Ankle plantarflexion    Ankle inversion    Ankle eversion     (Blank rows = not tested)  LOWER EXTREMITY SPECIAL TESTS:  07/27/2023 No specific testing  FUNCTIONAL TESTS:  07/27/2023 18 inch chair transfer:  hands on knees  Lt SLS: 12 seconds Rt SLS: 4 seconds  GAIT: 07/27/2023 Independent ambulation with reduced knee flexion in swing and mild decreased in stance on Rt leg.  TODAY'S TREATMENT                                                                          DATE: 08/07/2023 Therex: Nustep lvl 5 10.5 mins for ROM using UE/LE. Seated hamstring curl Rt leg green band x 16 Seated Rt leg LAQ c end range pauses 2 x 15 2.5 lbs Cues for use and application of Rt foot df/pf, circles in elevation for swelling reduction.  Performed intermittently during vaso  Manual: Seated Rt knee flexion c distraction, IR with contralateral leg movement opposite.  Contract relax to Rt quad for flexion mobility gains.   TherActivity (to improve transfers, stairs) Leg press double leg 68 lbs x 15, single leg performed bilaterally x 15 Step on over and down 4 inch step x 10 WB on Rt leg.   Vasopneumatic 34 deg Rt knee in elevation medium compression  with active DF/PF, circles ankle  TODAY'S TREATMENT                                                                          DATE: 08/01/2023 Therex: Nustep: Level 5 x 7 minutes  Slant board: x 3 holding 20 sec Calf raises: x 20  LAQ: 2 x 10 Rt LE Seated SLR 2 x 10  Supine heel slides with strap and ball x 15 holding 5 sec TherActivities:  Step ups on 6 inch step x 15 c UE support Lateral step ups: 6 inch step x 10 (up and over) Sit to stand 2 x 10 Manual  Knee flexion supine PROM Knee flexion in sitting PROM Modalities:  Vasopneumtic device x 10 minutes, medium compression  TODAY'S TREATMENT                                                                          DATE: 07/27/2023 Therex:    HEP instruction/performance c cues for techniques, handout provided.  Trial set performed of each for comprehension and symptom assessment.  See below for exercise list Nustep Lvl 5 8 mins UE/LE for ROM  Manual Seated Rt knee flexion c IR/distraction mobilization with movement.   PATIENT  EDUCATION:  07/27/2023 Education details: HEP, POC Person educated: Patient Education method: Programmer, multimedia, Demonstration, Verbal cues, and Handouts Education comprehension: verbalized understanding, returned demonstration, and verbal cues required  HOME EXERCISE PROGRAM: Access Code: [email or web address] URL: [email or web address] Date: 07/27/2023 Prepared by: Chyrel Masson  Exercises - Sit to Stand  - 3 x daily - 7 x weekly - 1 sets - 10 reps - Supine Heel Slide  - 3-5 x daily - 7 x weekly - 1 sets - 10 reps - 5 hold - Supine Heel Slide with Strap (Mirrored)  -  3-5 x daily - 7 x weekly - 1 sets - 10 reps - 5 hold - Supine Quadricep Sets  - 3-5 x daily - 7 x weekly - 1 sets - 10 reps - 5 hold - Seated Long Arc Quad  - 3-5 x daily - 7 x weekly - 1 sets - 5-10 reps - 2 hold - Seated Quad Set  - 3-5 x daily - 7 x weekly - 1 sets - 10 reps - 5 hold  ASSESSMENT:  CLINICAL IMPRESSION: Pt to benefit from continued skilled PT services to prompt strengthening and knee mobility gains to improve WB control and ability.   Knee flexion to 90 deg AROM today.    OBJECTIVE IMPAIRMENTS: Abnormal gait, decreased activity tolerance, decreased balance, decreased coordination, decreased endurance, decreased mobility, difficulty walking, decreased ROM, decreased strength, hypomobility, increased edema, increased fascial restrictions, impaired perceived functional ability, increased muscle spasms, impaired flexibility, improper body mechanics, and pain.   ACTIVITY LIMITATIONS: lifting, bending, sitting, standing, squatting, stairs, transfers, and locomotion level  PARTICIPATION LIMITATIONS: meal prep, cleaning, laundry, interpersonal relationship, driving, shopping, community activity, and occupation  PERSONAL FACTORS:  Medical hx: anemia, back pain, hyperlipidemia, OA rt knee  are also affecting patient's functional outcome.   REHAB POTENTIAL: Good  CLINICAL DECISION MAKING:  Stable/uncomplicated  EVALUATION COMPLEXITY: Low   GOALS: Goals reviewed with patient? Yes  SHORT TERM GOALS: (target date for Short term goals are 3 weeks 08/17/2023)   1.  Patient will demonstrate independent use of home exercise program to maintain progress from in clinic treatments.  Goal status: on going 08/07/2023  LONG TERM GOALS: (target dates for all long term goals are 10 weeks  10/05/2023 )   1. Patient will demonstrate/report pain at worst less than or equal to 2/10 to facilitate minimal limitation in daily activity secondary to pain symptoms.  Goal status: New   2. Patient will demonstrate independent use of home exercise program to facilitate ability to maintain/progress functional gains from skilled physical therapy services.  Goal status: New   3. Patient will demonstrate Patient specific functional scale avg > or = 8/10 to indicate reduced disability due to condition.   Goal status: New   4.  Patient will demonstrate Rt LE MMT 5/5 throughout to faciltiate usual transfers, stairs, squatting at [facility] for daily life.   Goal status: New   5.  Patient will demonstrate Rt knee 0-110 deg to facilitate transfers, squatting, and ambulation at PLOF s deviation.  Goal status: New   6.  Patient will demonstrate bilateral SLS > 15 seconds to facilitate stability in ambulation on even and uneven surfaces.  Goal status: New   7.  Patient will demonstrate ascending/descending stairs reciprocally s UE assist for community integration.   Goal Status: New   PLAN:  PT FREQUENCY: 1-2x/week  PT DURATION: 10 weeks  PLANNED INTERVENTIONS: Can include 64403- PT Re-evaluation, 97110-Therapeutic exercises, 97530- Therapeutic activity, 97112- Neuromuscular re-education, 97535- Self Care, 97140- Manual therapy, [phone]- Gait training, [phone]- Orthotic Fit/training, [phone]- Canalith repositioning, [id number]- Aquatic Therapy, [phone]- Electrical stimulation (unattended), [phone]- Electrical  stimulation (manual), [id number] Physical performance testing, 97016- Vasopneumatic device, [id number]- Ultrasound, [id number]- Traction (mechanical), [id number]- Ionotophoresis 4mg /ml Dexamethasone, Patient/Family education, Balance training, Stair training, Taping, Dry Needling, Joint mobilization, Joint manipulation, Spinal manipulation, Spinal mobilization, Scar mobilization, Vestibular training, Visual/preceptual remediation/compensation, DME instructions, Cryotherapy, and Moist heat.  All performed as medically necessary.  All included unless contraindicated  PLAN FOR NEXT SESSION: Progress flexion, strength as able.  Early balance  progression.    Chyrel Masson, PT, DPT, OCS, ATC 08/07/23  12:20 PM

## 2023-08-08 ENCOUNTER — Ambulatory Visit (INDEPENDENT_AMBULATORY_CARE_PROVIDER_SITE_OTHER): Admitting: Family Medicine

## 2023-08-08 ENCOUNTER — Encounter: Payer: Self-pay | Admitting: Family Medicine

## 2023-08-08 VITALS — BP 124/72 | HR 79 | Temp 98.6°F | Ht 62.5 in | Wt 207.1 lb

## 2023-08-08 DIAGNOSIS — E559 Vitamin D deficiency, unspecified: Secondary | ICD-10-CM

## 2023-08-08 DIAGNOSIS — Z0000 Encounter for general adult medical examination without abnormal findings: Secondary | ICD-10-CM

## 2023-08-08 DIAGNOSIS — R7303 Prediabetes: Secondary | ICD-10-CM

## 2023-08-08 DIAGNOSIS — Z8639 Personal history of other endocrine, nutritional and metabolic disease: Secondary | ICD-10-CM | POA: Diagnosis not present

## 2023-08-08 DIAGNOSIS — E7849 Other hyperlipidemia: Secondary | ICD-10-CM

## 2023-08-08 DIAGNOSIS — D649 Anemia, unspecified: Secondary | ICD-10-CM

## 2023-08-08 DIAGNOSIS — E66812 Obesity, class 2: Secondary | ICD-10-CM

## 2023-08-08 DIAGNOSIS — Z1231 Encounter for screening mammogram for malignant neoplasm of breast: Secondary | ICD-10-CM

## 2023-08-08 DIAGNOSIS — Z6837 Body mass index (BMI) 37.0-37.9, adult: Secondary | ICD-10-CM

## 2023-08-08 NOTE — Assessment & Plan Note (Signed)
 Reviewed health habits including diet and exercise and skin cancer prevention Reviewed appropriate screening tests for age  Also reviewed health mt list, fam hx and immunization status , as well as social and family history   See HPI Labs reviewed and ordered Health Maintenance  Topic Date Due   Flu Shot  08/14/2023*   Hepatitis C Screening  08/07/2024*   Zoster (Shingles) Vaccine (1 of 2) 11/07/2024*   COVID-19 Vaccine (6 - 2024-25 season) 08/23/2025*   Mammogram  09/12/2023   DTaP/Tdap/Td vaccine (3 - Td or Tdap) 09/01/2024   Pap with HPV screening  07/28/2026   Colon Cancer Screening  09/14/2031   HIV Screening  Completed   HPV Vaccine  Aged Out  *Topic was postponed. The date shown is not the original due date.   Declines hep C screen due to low risk Declines shingrix and flu shot  Mammogram planned for early may  Discussed fall prevention, supplements and exercise for bone density  PHQ 0

## 2023-08-08 NOTE — Assessment & Plan Note (Signed)
 Normal cbc this draw

## 2023-08-08 NOTE — Assessment & Plan Note (Signed)
 Discussed how this problem influences overall health and the risks it imposes  Reviewed plan for weight loss with lower calorie diet (via better food choices (lower glycemic and portion control) along with exercise building up to or more than 30 minutes 5 days per week including some aerobic activity and strength training

## 2023-08-08 NOTE — Assessment & Plan Note (Signed)
 Last vitamin D Lab Results  Component Value Date   VD25OH 43.94 08/07/2023   Vitamin D level is therapeutic with current supplementation Disc importance of this to bone and overall health

## 2023-08-08 NOTE — Patient Instructions (Addendum)
 Continue your physical therapy  Try and walk when you can  Add some strength training to your routine, this is important for bone and brain health and can reduce your risk of falls and help your body use insulin properly and regulate weight  Light weights, exercise bands , and internet videos are a good way to start  Yoga (chair or regular), machines , floor exercises or a gym with machines are also good options     For cholesterol Avoid red meat/ fried foods/ egg yolks/ fatty breakfast meats/ butter, cheese and high fat dairy/ and shellfish   Let's check cholesterol in 3 months   To prevent diabetes  Avoid red meat/ fried foods/ egg yolks/ fatty breakfast meats/ butter, cheese and high fat dairy/ and shellfish   Take care of yourself

## 2023-08-08 NOTE — Assessment & Plan Note (Signed)
 Disc goals for lipids and reasons to control them Rev last labs with pt Rev low sat fat diet in detail LDL up to 143  Will work on diet  Re check 3 mo  If not at goal consider statin  Has fam history of CAD and this may be hereditary

## 2023-08-08 NOTE — Assessment & Plan Note (Signed)
 Lab Results  Component Value Date   HGBA1C 5.9 08/07/2023   HGBA1C 6.0 07/28/2022   HGBA1C 6.0 (H) 07/23/2020   disc imp of low glycemic diet and wt loss to prevent DM2

## 2023-08-08 NOTE — Progress Notes (Signed)
 Subjective:    Patient ID: Victoria [name], female    DOB: 05/05/1961, 63 y.o.   MRN: [id number]  HPI  Here for health maintenance exam and to review chronic medical problems   Wt Readings from Last 3 Encounters:  08/08/23 207 lb 2 oz (94 kg)  06/01/23 203 lb (92.1 kg)  05/31/23 208 lb (94.3 kg)   37.28 kg/m  Vitals:   08/08/23 1522  BP: 124/72  Pulse: 79  Temp: 98.6 F (37 C)  SpO2: 98%    Immunization History  Administered Date(s) Administered   Influenza Split 06/08/2011   Moderna Sars-Covid-2 Vaccination 09/28/2019, 10/26/2019, 05/23/2020, 11/26/2020, 07/24/2021   Td 11/22/2004   Tdap 09/02/2014    There are no preventive care reminders to display for this patient.  Had partial knee repl with Dr Joice Lofts this winter  Still struggling with it  Healing  Still some pain  Doing PT    Hep C screen -declines due to low risk   Shingrix vaccine -declines Flu shot- declines    Mammogram 08/2022 at [facility] , is set up for early may  No lumps on self exam    Gyn health Pap utd  07/2021  No complaints    Colon cancer screening -colonoscopy 09/2021 with 10 y recall    Bone health   Falls-none (twisted knee but did not fall)  Fractures-none  Supplements -vitamin D  Last vitamin D Lab Results  Component Value Date   VD25OH 43.94 08/07/2023    Exercise  PT currently  10-11,000 steps per day  More when it warms up     Mood    08/08/2023    3:29 PM 02/15/2023    2:20 PM 08/01/2022   11:12 AM 10/25/2021   10:27 AM 07/27/2021    3:28 PM  Depression screen PHQ 2/9  Decreased Interest 0 0 0 0 0  Down, Depressed, Hopeless 0 0 0 0 0  PHQ - 2 Score 0 0 0 0 0  Altered sleeping 0  0    Tired, decreased energy 0  0    Change in appetite 0  0    Feeling bad or failure about yourself  0  0    Trouble concentrating 0  0    Moving slowly or fidgety/restless 0  0    Suicidal thoughts 0  0    PHQ-9 Score 0  0    Difficult doing work/chores Not  difficult at all  Not difficult at all        History of anemia  Lab Results  Component Value Date   WBC 6.3 08/07/2023   HGB 13.9 08/07/2023   HCT 41.6 08/07/2023   MCV 85.2 08/07/2023   PLT 293.0 08/07/2023    Lab Results  Component Value Date   ALT 18 08/07/2023   AST 15 08/07/2023   ALKPHOS 57 08/07/2023   BILITOT 0.5 08/07/2023   Lab Results  Component Value Date   NA 140 08/07/2023   K 4.0 08/07/2023   CO2 28 08/07/2023   GLUCOSE 102 (H) 08/07/2023   BUN 11 08/07/2023   CREATININE 0.67 08/07/2023   CALCIUM 9.5 08/07/2023   GFR 93.68 08/07/2023   EGFR 100 07/23/2020   GFRNONAA >60 06/01/2023     Hyperlipidemia  Lab Results  Component Value Date   CHOL 213 (H) 08/07/2023   CHOL 185 07/28/2022   CHOL 199 10/25/2021   Lab Results  Component Value Date   HDL  46.20 08/07/2023   HDL 42.60 07/28/2022   HDL 45.90 10/25/2021   Lab Results  Component Value Date   LDLCALC 143 (H) 08/07/2023   LDLCALC 128 (H) 07/28/2022   LDLCALC 131 (H) 10/25/2021   Lab Results  Component Value Date   TRIG 118.0 08/07/2023   TRIG 72.0 07/28/2022   TRIG 111.0 10/25/2021   Lab Results  Component Value Date   CHOLHDL 5 08/07/2023   CHOLHDL 4 07/28/2022   CHOLHDL 4 10/25/2021   No results found for: "LDLDIRECT"  Was eating more fried food (fries chicken on Sunday)  No beef  No ham Bacon or sausage once per week  Some biscuits  No ice cream Rarely has cheese  Some butter   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     Has fam history of CAD  Prediabetes Lab Results  Component Value Date   HGBA1C 5.9 08/07/2023   HGBA1C 6.0 07/28/2022   HGBA1C 6.0 (H) 07/23/2020   Lab Results  Component Value Date   TSH 2.59 08/07/2023      Patient Active Problem List   Diagnosis Date Noted   Routine general medical examination at a health care facility 08/08/2023   Situational anxiety 08/01/2022   Skin lesion 08/01/2022   Encounter for  screening mammogram for breast cancer 07/27/2021   Herpes simplex 07/27/2021   Vitamin D deficiency 08/06/2020   At risk for osteoporosis 08/06/2020   History of iron deficiency 08/06/2020   Back pain 07/23/2020   Anemia 07/23/2020   At risk for heart disease 07/23/2020   H/O small bowel obstruction 08/28/2018   Prediabetes 06/01/2018   Panic attacks 02/22/2016   Encounter for routine gynecological examination 09/02/2014   Chest pain 11/20/2011   Special screening for malignant neoplasms, colon 06/08/2011   Hyperlipidemia 06/08/2011   Encounter for general adult medical examination with abnormal findings 06/01/2011   LIPOMA 12/30/2009   FIBROIDS, UTERUS 04/27/2009   Obesity 03/19/2007   ALLERGIC RHINITIS 02/27/2007   Past Medical History:  Diagnosis Date   Acute bronchitis 02/15/2023   Allergic rhinitis    Anemia    Anemia, iron deficiency    Back pain    Gallbladder problem    Heavy menses    Hyperglycemia 02/22/2016   Hyperlipidemia    Lipoma    on L arm   Osteoarthritis of right knee 05/2023   Pre-diabetes    SOBOE (shortness of breath on exertion)    Uterine fibroid    Vitamin D deficiency    Past Surgical History:  Procedure Laterality Date   CESAREAN SECTION     19 [id number]   CHOLECYSTECTOMY  11/07/2011   ENDOMETRIAL ABLATION  2010   fibroids   PARTIAL KNEE ARTHROPLASTY Right 06/01/2023   Procedure: Right unicondylar knee arthroplasty;  Surgeon: Christena Flake, MD;  Location: [facility];  Service: Orthopedics;  Laterality: Right;   Pelvic U/S and transvaginal  04/27/2009   fibroids x 2   TONSILLECTOMY  1976   TUBAL LIGATION     Uterine ultrasound  06/1998   and endo biopsy- neg   Social History   Tobacco Use   Smoking status: Never   Smokeless tobacco: Never  Vaping Use   Vaping status: Never Used  Substance Use Topics   Alcohol use: No    Alcohol/week: 0.0 standard drinks of alcohol   Drug use: No   Family History  Problem Relation Age of  Onset   Diabetes  Mother    Hypertension Mother    Hyperlipidemia Mother    Cancer Mother        uterine cancer   Lung cancer Mother        died 45   Diabetes Father    Alcohol abuse Father    Hypertension Father    Heart disease Father    Sudden death Father    Alcoholism Father    Alcohol abuse Sister    Hypertension Sister    Diabetes Sister        boarderline   Diabetes Sister        severe   Alcohol abuse Brother    Hypertension Brother    Alcohol abuse Brother    Alcohol abuse Brother    Alcohol abuse Brother    Diabetes Maternal Grandmother    Alcohol abuse Paternal Grandfather    Colon cancer Neg Hx    Breast cancer Neg Hx    Colon polyps Neg Hx    Esophageal cancer Neg Hx    Stomach cancer Neg Hx    Rectal cancer Neg Hx    Allergies  Allergen Reactions   Codeine Nausea And Vomiting   Doxycycline Nausea Only   Current Outpatient Medications on File Prior to Visit  Medication Sig Dispense Refill   acetaminophen (TYLENOL) 500 MG tablet Take 1,000 mg by mouth every 6 (six) hours as needed for moderate pain (pain score 4-6).     ALPRAZolam (XANAX) 0.5 MG tablet Take 1 tablet (0.5 mg total) by mouth 2 (two) times daily as needed for anxiety (for travel anxiety). Caution of sedation 10 tablet 0   cholecalciferol (VITAMIN D3) 25 MCG (1000 UNIT) tablet Take 1,000 Units by mouth daily.     No current facility-administered medications on file prior to visit.    Review of Systems  Constitutional:  Negative for activity change, appetite change, fatigue, fever and unexpected weight change.  HENT:  Negative for congestion, ear pain, rhinorrhea, sinus pressure and sore throat.   Eyes:  Negative for pain, redness and visual disturbance.  Respiratory:  Negative for cough, shortness of breath and wheezing.   Cardiovascular:  Negative for chest pain and palpitations.  Gastrointestinal:  Negative for abdominal pain, blood in stool, constipation and diarrhea.  Endocrine:  Negative for polydipsia and polyuria.  Genitourinary:  Negative for dysuria, frequency and urgency.  Musculoskeletal:  Positive for arthralgias. Negative for back pain and myalgias.       Post operative knee pain on r    Skin:  Negative for pallor and rash.  Allergic/Immunologic: Negative for environmental allergies.  Neurological:  Negative for dizziness, syncope and headaches.  Hematological:  Negative for adenopathy. Does not bruise/bleed easily.  Psychiatric/Behavioral:  Negative for decreased concentration and dysphoric mood. The patient is not nervous/anxious.        Objective:   Physical Exam Constitutional:      General: She is not in acute distress.    Appearance: Normal appearance. She is well-developed. She is obese. She is not ill-appearing or diaphoretic.  HENT:     Head: Normocephalic and atraumatic.     Right Ear: Tympanic membrane, ear canal and external ear normal.     Left Ear: Tympanic membrane, ear canal and external ear normal.     Nose: Nose normal. No congestion.     Mouth/Throat:     Mouth: Mucous membranes are moist.     Pharynx: Oropharynx is clear. No posterior oropharyngeal erythema.  Eyes:  General: No scleral icterus.    Extraocular Movements: Extraocular movements intact.     Conjunctiva/sclera: Conjunctivae normal.     Pupils: Pupils are equal, round, and reactive to light.  Neck:     Thyroid: No thyromegaly.     Vascular: No carotid bruit or JVD.  Cardiovascular:     Rate and Rhythm: Normal rate and regular rhythm.     Pulses: Normal pulses.     Heart sounds: Normal heart sounds.     No gallop.  Pulmonary:     Effort: Pulmonary effort is normal. No respiratory distress.     Breath sounds: Normal breath sounds. No wheezing.     Comments: Good air exch Chest:     Chest wall: No tenderness.  Abdominal:     General: Bowel sounds are normal. There is no distension or abdominal bruit.     Palpations: Abdomen is soft. There is no mass.      Tenderness: There is no abdominal tenderness.     Hernia: No hernia is present.  Genitourinary:    Comments: Breast exam: No mass, nodules, thickening, tenderness, bulging, retraction, inflamation, nipple discharge or skin changes noted.  No axillary or clavicular LA.     Musculoskeletal:        General: No tenderness. Normal range of motion.     Cervical back: Normal range of motion and neck supple. No rigidity. No muscular tenderness.     Right lower leg: No edema.     Left lower leg: No edema.     Comments: No kyphosis   Limited knee rom right  Right ankle puffier than left   Lymphadenopathy:     Cervical: No cervical adenopathy.  Skin:    General: Skin is warm and dry.     Coloration: Skin is not pale.     Findings: No erythema or rash.     Comments: Some scattered lentigines and sks   Neurological:     Mental Status: She is alert. Mental status is at baseline.     Cranial Nerves: No cranial nerve deficit.     Motor: No abnormal muscle tone.     Coordination: Coordination normal.     Gait: Gait normal.     Deep Tendon Reflexes: Reflexes are normal and symmetric.  Psychiatric:        Mood and Affect: Mood normal.        Cognition and Memory: Cognition and memory normal.           Assessment & Plan:   Problem List Items Addressed This Visit       Other   Vitamin D deficiency   Last vitamin D Lab Results  Component Value Date   VD25OH 43.94 08/07/2023   Vitamin D level is therapeutic with current supplementation Disc importance of this to bone and overall health       Routine general medical examination at a health care facility - Primary   Reviewed health habits including diet and exercise and skin cancer prevention Reviewed appropriate screening tests for age  Also reviewed health mt list, fam hx and immunization status , as well as social and family history   See HPI Labs reviewed and ordered Health Maintenance  Topic Date Due   Flu Shot  08/14/2023*    Hepatitis C Screening  08/07/2024*   Zoster (Shingles) Vaccine (1 of 2) 11/07/2024*   COVID-19 Vaccine (6 - 2024-25 season) 08/23/2025*   Mammogram  09/12/2023   DTaP/Tdap/Td vaccine (3 -  Td or Tdap) 09/01/2024   Pap with HPV screening  07/28/2026   Colon Cancer Screening  09/14/2031   HIV Screening  Completed   HPV Vaccine  Aged Out  *Topic was postponed. The date shown is not the original due date.   Declines hep C screen due to low risk Declines shingrix and flu shot  Mammogram planned for early may  Discussed fall prevention, supplements and exercise for bone density  PHQ 0       Prediabetes   Lab Results  Component Value Date   HGBA1C 5.9 08/07/2023   HGBA1C 6.0 07/28/2022   HGBA1C 6.0 (H) 07/23/2020   disc imp of low glycemic diet and wt loss to prevent DM2       Obesity   Discussed how this problem influences overall health and the risks it imposes  Reviewed plan for weight loss with lower calorie diet (via better food choices (lower glycemic and portion control) along with exercise building up to or more than 30 minutes 5 days per week including some aerobic activity and strength training         Hyperlipidemia   Disc goals for lipids and reasons to control them Rev last labs with pt Rev low sat fat diet in detail LDL up to 143  Will work on diet  Re check 3 mo  If not at goal consider statin  Has fam history of CAD and this may be hereditary       Relevant Orders   Lipid panel   History of iron deficiency   Normal cbc this draw       Encounter for screening mammogram for breast cancer   Pt has mammogram scheduled for May       Anemia   Normal cbc this draw

## 2023-08-08 NOTE — Assessment & Plan Note (Signed)
 Pt has mammogram scheduled for May

## 2023-08-15 ENCOUNTER — Encounter: Payer: Self-pay | Admitting: Physical Therapy

## 2023-08-15 ENCOUNTER — Ambulatory Visit (INDEPENDENT_AMBULATORY_CARE_PROVIDER_SITE_OTHER): Admitting: Physical Therapy

## 2023-08-15 DIAGNOSIS — R262 Difficulty in walking, not elsewhere classified: Secondary | ICD-10-CM | POA: Diagnosis not present

## 2023-08-15 DIAGNOSIS — M6281 Muscle weakness (generalized): Secondary | ICD-10-CM

## 2023-08-15 DIAGNOSIS — M25561 Pain in right knee: Secondary | ICD-10-CM | POA: Diagnosis not present

## 2023-08-15 DIAGNOSIS — G8929 Other chronic pain: Secondary | ICD-10-CM

## 2023-08-15 DIAGNOSIS — R600 Localized edema: Secondary | ICD-10-CM | POA: Diagnosis not present

## 2023-08-15 NOTE — Therapy (Signed)
 OUTPATIENT PHYSICAL THERAPY    Patient Name: Victoria [name] MRN: [id number] DOB:11/07/1960, 63 y.o., female Today's Date: 08/15/2023  END OF SESSION:  PT End of Session - 08/15/23 0844     Visit Number 4    Number of Visits 20    Date for PT Re-Evaluation 10/05/23    Progress Note Due on Visit 10    PT Start Time [phone]    PT Stop Time 0930    PT Time Calculation (min) 48 min    Activity Tolerance Patient tolerated treatment well    Behavior During Therapy [facility] for tasks assessed/performed               Past Medical History:  Diagnosis Date   Acute bronchitis 02/15/2023   Allergic rhinitis    Anemia    Anemia, iron deficiency    Back pain    Gallbladder problem    Heavy menses    Hyperglycemia 02/22/2016   Hyperlipidemia    Lipoma    on L arm   Osteoarthritis of right knee 05/2023   Pre-diabetes    SOBOE (shortness of breath on exertion)    Uterine fibroid    Vitamin D deficiency    Past Surgical History:  Procedure Laterality Date   CESAREAN SECTION     [phone]   CHOLECYSTECTOMY  11/07/2011   ENDOMETRIAL ABLATION  2010   fibroids   PARTIAL KNEE ARTHROPLASTY Right 06/01/2023   Procedure: Right unicondylar knee arthroplasty;  Surgeon: Christena Flake, MD;  Location: [facility] ORS;  Service: Orthopedics;  Laterality: Right;   Pelvic U/S and transvaginal  04/27/2009   fibroids x 2   TONSILLECTOMY  1976   TUBAL LIGATION     Uterine ultrasound  06/1998   and endo biopsy- neg   Patient Active Problem List   Diagnosis Date Noted   Routine general medical examination at a health care facility 08/08/2023   Situational anxiety 08/01/2022   Skin lesion 08/01/2022   Encounter for screening mammogram for breast cancer 07/27/2021   Herpes simplex 07/27/2021   Vitamin D deficiency 08/06/2020   At risk for osteoporosis 08/06/2020   History of iron deficiency 08/06/2020   Back pain 07/23/2020   Anemia 07/23/2020   At risk for heart disease 07/23/2020   H/O small  bowel obstruction 08/28/2018   Prediabetes 06/01/2018   Panic attacks 02/22/2016   Encounter for routine gynecological examination 09/02/2014   Chest pain 11/20/2011   Special screening for malignant neoplasms, colon 06/08/2011   Hyperlipidemia 06/08/2011   Encounter for general adult medical examination with abnormal findings 06/01/2011   LIPOMA 12/30/2009   FIBROIDS, UTERUS 04/27/2009   Obesity 03/19/2007   ALLERGIC RHINITIS 02/27/2007    PCP: Judy Pimple MD  REFERRING PROVIDER: Christena Flake, MD  REFERRING DIAG: [phone] (ICD-10-CM) - Presence of right artificial knee joint M17.11 (ICD-10-CM) - Unilateral primary osteoarthritis, right knee  THERAPY DIAG:  Chronic pain of right knee  Muscle weakness (generalized)  Difficulty in walking, not elsewhere classified  Localized edema  Rationale for Evaluation and Treatment: Rehabilitation  ONSET DATE: 07/14/2023 Partial Rt TKA  SUBJECTIVE:   SUBJECTIVE STATEMENT: Pt indicated weather getting her some today.  Reported soreness after last visit and this morning with weather.    PERTINENT HISTORY: Medical hx: anemia, back pain, hyperlipidemia, OA rt knee  PAIN:  NPRS scale:  4/10 upon arrival  Pain location: Rt knee  Pain description: irritation Aggravating factors: prolonged WB activity, sitting prolonged Relieving  factors: icing  PRECAUTIONS: None  WEIGHT BEARING RESTRICTIONS: No  FALLS:  Has patient fallen in last 6 months? No  LIVING ENVIRONMENT: Lives in: House/apartment Stairs: stairs to enter with railing both side:  7 steps.  Has second floor but not to bedroom.  Has following equipment at home: has walker, cane.    OCCUPATION: work requires standing/walking.  Work 5-6hrs most of the week.   PLOF: Independent, walking.  Housework.   PATIENT GOALS: Reduce pain  OBJECTIVE:   PATIENT SURVEYS:  Patient-Specific Activity Scoring Scheme  "0" represents "unable to perform." "10" represents "able to  perform at prior level. 0 1 2 3 4 5 6 7 8 9  10 (Date and Score)   Activity 07/27/2023    1. Putting shoes on  6    2. Walking at work  5    3. Sitting prolonged 5   4.    5.    Score 5.333    Total score = sum of the activity scores/number of activities Minimum detectable change (90%CI) for average score = 2 points Minimum detectable change (90%CI) for single activity score = 3 points  COGNITION: 07/27/2023 Overall cognitive status: WFL    SENSATION: 07/27/2023 Not tested  EDEMA:  07/27/2023 Localized edema Rt knee, lower leg.   MUSCLE LENGTH: 07/27/2023   POSTURE:  07/27/2023 No Significant postural limitations  PALPATION: 07/27/2023 Mild tenderness incision to light touch.   LOWER EXTREMITY ROM:   ROM A: active, P: passive Right 07/27/2023 Left 07/27/23 Rt 08/01/23 Right 08/07/2023  Hip flexion      Hip extension      Hip abduction      Hip adduction      Hip internal rotation      Hip external rotation      Knee flexion 85 AROM in supine heel slide  A: 80 P: 86 Supine heel slide AROM in supine heel slide 90  Knee extension -8 AROM in seated LAQ     Ankle dorsiflexion      Ankle plantarflexion      Ankle inversion      Ankle eversion       (Blank rows = not tested)  LOWER EXTREMITY MMT:  MMT Right 07/27/2023 Left 07/27/2023  Hip flexion 4/5 5/5  Hip extension    Hip abduction    Hip adduction    Hip internal rotation    Hip external rotation    Knee flexion 5/5   Knee extension 4/5 5/5  Ankle dorsiflexion 5/5 5/5  Ankle plantarflexion    Ankle inversion    Ankle eversion     (Blank rows = not tested)  LOWER EXTREMITY SPECIAL TESTS:  07/27/2023 No specific testing  FUNCTIONAL TESTS:  07/27/2023 18 inch chair transfer:  hands on knees  Lt SLS: 12 seconds Rt SLS: 4 seconds  GAIT: 07/27/2023 Independent ambulation with reduced knee flexion in swing and mild decreased in stance on Rt leg.  TODAY'S TREATMENT                                                                          DATE:  08/15/2023 Therex: Nustep lvl 5 10.5 mins for ROM using UE/LE. Gastroc stretch 30 sec X 3 bilat on slantboard Seated hamstring curl Rt leg green band 2 X 15 Seated Rt leg LAQ c end range pauses 2 x 15 2.5 lbs   Manual: Seated Rt knee flexion c distraction, IR with contralateral leg movement opposite.  Contract relax to Rt quad for flexion mobility gains.   TherActivity (to improve transfers, stairs) Leg press double leg 68 lbs 2 x 10, single leg 50 lbs performed bilaterally 2 X 10 Step ups forward 6 inch step leading with Rt, X 10 Lateral step ups bilat, 6 inch step X 10 each side Step on over and down 6 inch step x 10 WB on Rt leg.   Vasopneumatic 34 deg Rt knee in elevation medium compression  X10 min  08/07/2023 Therex: Nustep lvl 5 10.5 mins for ROM using UE/LE. Seated hamstring curl Rt leg green band x 16 Seated Rt leg LAQ c end range pauses 2 x 15 2.5 lbs Cues for use and application of Rt foot df/pf, circles in elevation for swelling reduction.  Performed intermittently during vaso  Manual: Seated Rt knee flexion c distraction, IR with contralateral leg movement opposite.  Contract relax to Rt quad for flexion mobility gains.   TherActivity (to improve transfers, stairs) Leg press double leg 68 lbs x 15, single leg performed bilaterally x 15 Step on over and down 4 inch step x 10 WB on Rt leg.   Vasopneumatic 34 deg Rt knee in elevation medium compression  with active DF/PF, circles ankle  TODAY'S TREATMENT                                                                          DATE: 08/01/2023 Therex: Nustep: Level 5 x 7 minutes  Slant board: x 3 holding 20 sec Calf raises: x 20  LAQ: 2 x 10 Rt LE Seated SLR 2 x 10  Supine heel slides with strap and ball  x 15 holding 5 sec TherActivities:  Step ups on 6 inch step x 15 c UE support Lateral step ups: 6 inch step x 10 (up and over) Sit to stand 2 x 10 Manual  Knee flexion supine PROM Knee flexion in sitting PROM Modalities:  Vasopneumtic device x 10 minutes, medium compression  TODAY'S TREATMENT                                                                          DATE:  07/27/2023 Therex:    HEP instruction/performance c cues for techniques, handout provided.  Trial set performed of each for comprehension and symptom assessment.  See below for exercise list Nustep Lvl 5 8 mins UE/LE for ROM  Manual Seated Rt knee flexion c IR/distraction mobilization with movement.   PATIENT EDUCATION:  07/27/2023 Education details: HEP, POC Person educated: Patient Education method: Programmer, multimedia, Demonstration, Verbal cues, and Handouts Education comprehension: verbalized understanding, returned demonstration, and verbal cues required  HOME EXERCISE PROGRAM: Access Code: [email or web address] URL: [email or web address] Date: 07/27/2023 Prepared by: Chyrel Masson  Exercises - Sit to Stand  - 3 x daily - 7 x weekly - 1 sets - 10 reps - Supine Heel Slide  - 3-5 x daily - 7 x weekly - 1 sets - 10 reps - 5 hold - Supine Heel Slide with Strap (Mirrored)  - 3-5 x daily - 7 x weekly - 1 sets - 10 reps - 5 hold - Supine Quadricep Sets  - 3-5 x daily - 7 x weekly - 1 sets - 10 reps - 5 hold - Seated Long Arc Quad  - 3-5 x daily - 7 x weekly - 1 sets - 5-10 reps - 2 hold - Seated Quad Set  - 3-5 x daily - 7 x weekly - 1 sets - 10 reps - 5 hold  ASSESSMENT:  CLINICAL IMPRESSION: She appears to be progressing well with strength and ambulation. She will need to further improve knee flexion ROM which was a focus of session today.     OBJECTIVE IMPAIRMENTS: Abnormal gait, decreased activity tolerance, decreased balance, decreased coordination, decreased endurance, decreased mobility, difficulty walking,  decreased ROM, decreased strength, hypomobility, increased edema, increased fascial restrictions, impaired perceived functional ability, increased muscle spasms, impaired flexibility, improper body mechanics, and pain.   ACTIVITY LIMITATIONS: lifting, bending, sitting, standing, squatting, stairs, transfers, and locomotion level  PARTICIPATION LIMITATIONS: meal prep, cleaning, laundry, interpersonal relationship, driving, shopping, community activity, and occupation  PERSONAL FACTORS:  Medical hx: anemia, back pain, hyperlipidemia, OA rt knee  are also affecting patient's functional outcome.   REHAB POTENTIAL: Good  CLINICAL DECISION MAKING: Stable/uncomplicated  EVALUATION COMPLEXITY: Low   GOALS: Goals reviewed with patient? Yes  SHORT TERM GOALS: (target date for Short term goals are 3 weeks 08/17/2023)   1.  Patient will demonstrate independent use of home exercise program to maintain progress from in clinic treatments.  Goal status: on going 08/07/2023  LONG TERM GOALS: (target dates for all long term goals are 10 weeks  10/05/2023 )   1. Patient will demonstrate/report pain at worst less than or equal to 2/10 to facilitate minimal limitation in daily activity secondary to pain symptoms.  Goal status: New   2. Patient will demonstrate independent use of home exercise program to facilitate ability to maintain/progress functional gains from skilled physical therapy services.  Goal status: New   3. Patient will demonstrate Patient specific functional scale avg > or = 8/10 to indicate reduced disability due to condition.   Goal status: New   4.  Patient will demonstrate Rt LE MMT 5/5 throughout to faciltiate usual transfers, stairs, squatting at [facility] for daily life.   Goal status: New   5.  Patient will demonstrate Rt knee 0-110 deg to facilitate transfers, squatting, and ambulation at PLOF s deviation.  Goal status: New   6.  Patient will demonstrate bilateral SLS > 15  seconds to facilitate stability in ambulation on even and uneven surfaces.  Goal status: New  7.  Patient will demonstrate ascending/descending stairs reciprocally s UE assist for community integration.   Goal Status: New   PLAN:  PT FREQUENCY: 1-2x/week  PT DURATION: 10 weeks  PLANNED INTERVENTIONS: Can include [id number]- PT Re-evaluation, 97110-Therapeutic exercises, 97530- Therapeutic activity, 97112- Neuromuscular re-education, 97535- Self Care, 97140- Manual therapy, [phone]- Gait training, [phone]- Orthotic Fit/training, [phone]- Canalith repositioning, [id number]- Aquatic Therapy, [phone]- Electrical stimulation (unattended), [phone]- Electrical stimulation (manual), [id number] Physical performance testing, 97016- Vasopneumatic device, [id number]- Ultrasound, [id number]- Traction (mechanical), [id number]- Ionotophoresis 4mg /ml Dexamethasone, Patient/Family education, Balance training, Stair training, Taping, Dry Needling, Joint mobilization, Joint manipulation, Spinal manipulation, Spinal mobilization, Scar mobilization, Vestibular training, Visual/preceptual remediation/compensation, DME instructions, Cryotherapy, and Moist heat.  All performed as medically necessary.  All included unless contraindicated  PLAN FOR NEXT SESSION: Progress flexion, strength as able.    Ivery Quale, PT, DPT 08/15/23 9:20 AM

## 2023-08-16 ENCOUNTER — Encounter: Payer: Self-pay | Admitting: Rehabilitative and Restorative Service Providers"

## 2023-08-16 ENCOUNTER — Ambulatory Visit (INDEPENDENT_AMBULATORY_CARE_PROVIDER_SITE_OTHER): Admitting: Rehabilitative and Restorative Service Providers"

## 2023-08-16 DIAGNOSIS — R262 Difficulty in walking, not elsewhere classified: Secondary | ICD-10-CM | POA: Diagnosis not present

## 2023-08-16 DIAGNOSIS — M25561 Pain in right knee: Secondary | ICD-10-CM

## 2023-08-16 DIAGNOSIS — R600 Localized edema: Secondary | ICD-10-CM | POA: Diagnosis not present

## 2023-08-16 DIAGNOSIS — M6281 Muscle weakness (generalized): Secondary | ICD-10-CM

## 2023-08-16 DIAGNOSIS — G8929 Other chronic pain: Secondary | ICD-10-CM

## 2023-08-16 NOTE — Therapy (Signed)
 OUTPATIENT PHYSICAL THERAPY    Patient Name: Victoria [name] MRN: [id number] DOB:1961/01/15, 63 y.o., female Today's Date: 08/16/2023  END OF SESSION:  PT End of Session - 08/16/23 1512     Visit Number 5    Number of Visits 20    Date for PT Re-Evaluation 10/05/23    Authorization Type [employer] 15% coinsurance - 60 visits    Progress Note Due on Visit 10    PT Start Time 1508    PT Stop Time 1557    PT Time Calculation (min) 49 min    Activity Tolerance Patient tolerated treatment well    Behavior During Therapy [facility] for tasks assessed/performed                Past Medical History:  Diagnosis Date   Acute bronchitis 02/15/2023   Allergic rhinitis    Anemia    Anemia, iron deficiency    Back pain    Gallbladder problem    Heavy menses    Hyperglycemia 02/22/2016   Hyperlipidemia    Lipoma    on L arm   Osteoarthritis of right knee 05/2023   Pre-diabetes    SOBOE (shortness of breath on exertion)    Uterine fibroid    Vitamin D deficiency    Past Surgical History:  Procedure Laterality Date   CESAREAN SECTION     [phone]   CHOLECYSTECTOMY  11/07/2011   ENDOMETRIAL ABLATION  2010   fibroids   PARTIAL KNEE ARTHROPLASTY Right 06/01/2023   Procedure: Right unicondylar knee arthroplasty;  Surgeon: Christena Flake, MD;  Location: [facility] ORS;  Service: Orthopedics;  Laterality: Right;   Pelvic U/S and transvaginal  04/27/2009   fibroids x 2   TONSILLECTOMY  1976   TUBAL LIGATION     Uterine ultrasound  06/1998   and endo biopsy- neg   Patient Active Problem List   Diagnosis Date Noted   Routine general medical examination at a health care facility 08/08/2023   Situational anxiety 08/01/2022   Skin lesion 08/01/2022   Encounter for screening mammogram for breast cancer 07/27/2021   Herpes simplex 07/27/2021   Vitamin D deficiency 08/06/2020   At risk for osteoporosis 08/06/2020   History of iron deficiency 08/06/2020   Back pain 07/23/2020   Anemia  07/23/2020   At risk for heart disease 07/23/2020   H/O small bowel obstruction 08/28/2018   Prediabetes 06/01/2018   Panic attacks 02/22/2016   Encounter for routine gynecological examination 09/02/2014   Chest pain 11/20/2011   Special screening for malignant neoplasms, colon 06/08/2011   Hyperlipidemia 06/08/2011   Encounter for general adult medical examination with abnormal findings 06/01/2011   LIPOMA 12/30/2009   FIBROIDS, UTERUS 04/27/2009   Obesity 03/19/2007   ALLERGIC RHINITIS 02/27/2007    PCP: Judy Pimple MD  REFERRING PROVIDER: Christena Flake, MD  REFERRING DIAG: [phone] (ICD-10-CM) - Presence of right artificial knee joint M17.11 (ICD-10-CM) - Unilateral primary osteoarthritis, right knee  THERAPY DIAG:  Chronic pain of right knee  Muscle weakness (generalized)  Difficulty in walking, not elsewhere classified  Localized edema  Rationale for Evaluation and Treatment: Rehabilitation  ONSET DATE: 07/14/2023 Partial Rt TKA  SUBJECTIVE:   SUBJECTIVE STATEMENT: Pt indicated no specific pain today, just stiff.  Felt ok after yesterday.   PERTINENT HISTORY: Medical hx: anemia, back pain, hyperlipidemia, OA rt knee  PAIN:  NPRS scale: no specific pain upon arrival.  Pain location: Rt knee  Pain description: irritation  Aggravating factors: prolonged WB activity, sitting prolonged Relieving factors: icing  PRECAUTIONS: None  WEIGHT BEARING RESTRICTIONS: No  FALLS:  Has patient fallen in last 6 months? No  LIVING ENVIRONMENT: Lives in: House/apartment Stairs: stairs to enter with railing both side:  7 steps.  Has second floor but not to bedroom.  Has following equipment at home: has walker, cane.    OCCUPATION: work requires standing/walking.  Work 5-6hrs most of the week.   PLOF: Independent, walking.  Housework.   PATIENT GOALS: Reduce pain  OBJECTIVE:   PATIENT SURVEYS:  Patient-Specific Activity Scoring Scheme  "0" represents "unable to  perform." "10" represents "able to perform at prior level. 0 1 2 3 4 5 6 7 8 9  10 (Date and Score)   Activity 07/27/2023    1. Putting shoes on  6    2. Walking at work  5    3. Sitting prolonged 5   4.    5.    Score 5.333    Total score = sum of the activity scores/number of activities Minimum detectable change (90%CI) for average score = 2 points Minimum detectable change (90%CI) for single activity score = 3 points  COGNITION: 07/27/2023 Overall cognitive status: WFL    SENSATION: 07/27/2023 Not tested  EDEMA:  07/27/2023 Localized edema Rt knee, lower leg.   MUSCLE LENGTH: 07/27/2023   POSTURE:  07/27/2023 No Significant postural limitations  PALPATION: 07/27/2023 Mild tenderness incision to light touch.   LOWER EXTREMITY ROM:   ROM A: active, P: passive Right 07/27/2023 Left 07/27/23 Rt 08/01/23 Right 08/07/2023  Hip flexion      Hip extension      Hip abduction      Hip adduction      Hip internal rotation      Hip external rotation      Knee flexion 85 AROM in supine heel slide  A: 80 P: 86 Supine heel slide AROM in supine heel slide 90  Knee extension -8 AROM in seated LAQ     Ankle dorsiflexion      Ankle plantarflexion      Ankle inversion      Ankle eversion       (Blank rows = not tested)  LOWER EXTREMITY MMT:  MMT Right 07/27/2023 Left 07/27/2023  Hip flexion 4/5 5/5  Hip extension    Hip abduction    Hip adduction    Hip internal rotation    Hip external rotation    Knee flexion 5/5   Knee extension 4/5 5/5  Ankle dorsiflexion 5/5 5/5  Ankle plantarflexion    Ankle inversion    Ankle eversion     (Blank rows = not tested)  LOWER EXTREMITY SPECIAL TESTS:  07/27/2023 No specific testing  FUNCTIONAL TESTS:  07/27/2023 18 inch chair transfer:  hands on knees  Lt SLS: 12 seconds Rt SLS: 4 seconds  GAIT: 07/27/2023 Independent ambulation with reduced knee flexion in swing and mild decreased in stance on Rt leg.  TODAY'S TREATMENT                                                                          DATE: 08/16/2023 Therex: Nustep lvl 6 10 mins for ROM using UE/LE. Incline gastroc stretch 30 sec x 3 bilateral  Seated hamstring curl Rt leg green band x20 Seated Rt leg LAQ c end range pauses 2 x 15 3 lbs  Neuro Re-ed Tandem stance on foam 1 min x 2 bilateral with occasional HHA  SLS with black mat corner touching contralateral leg x 8 each, performed bilaterally SLS with contralateral leg step over and back 6 inch hurdle x 10 bilateral   Manual: Seated Rt knee flexion c distraction, IR with contralateral leg movement opposite.  Contract relax to Rt quad for flexion mobility gains.   Vasopneumatic 34 deg Rt knee in elevation medium compression  X10 min  TODAY'S TREATMENT                                                                          DATE: 08/15/2023 Therex: Nustep lvl 5 10.5 mins for ROM using UE/LE. Gastroc stretch 30 sec X 3 bilat on slantboard Seated hamstring curl Rt leg green band 2 X 15 Seated Rt leg LAQ c end range pauses 2 x 15 2.5 lbs   Manual: Seated Rt knee flexion c distraction, IR with contralateral leg movement opposite.  Contract relax to Rt quad for flexion mobility gains.   TherActivity (to improve transfers, stairs) Leg press double leg 68 lbs 2 x 10, single leg 50 lbs performed bilaterally 2 X 10 Step ups forward 6 inch step leading with Rt, X 10 Lateral step ups bilat, 6 inch step X 10 each side Step on over and down 6 inch step x 10 WB on Rt leg.   Vasopneumatic 34 deg Rt knee in elevation medium compression  X10 min  TODAY'S TREATMENT                                                                          DATE:08/07/2023 Therex: Nustep lvl 5 10.5 mins for ROM using UE/LE. Seated hamstring curl Rt leg green  band x 16 Seated Rt leg LAQ c end range pauses 2 x 15 2.5 lbs Cues for use and application of Rt foot df/pf, circles in elevation for swelling reduction.  Performed intermittently during vaso  Manual: Seated Rt knee flexion c distraction, IR with contralateral leg movement opposite.  Contract relax to Rt quad for flexion mobility gains.   TherActivity (to improve transfers, stairs) Leg press double leg 68 lbs x 15, single leg performed bilaterally x 15 Step on over and down 4  inch step x 10 WB on Rt leg.   Vasopneumatic 34 deg Rt knee in elevation medium compression  with active DF/PF, circles ankle   PATIENT EDUCATION:  07/27/2023 Education details: HEP, POC Person educated: Patient Education method: Programmer, multimedia, Demonstration, Verbal cues, and Handouts Education comprehension: verbalized understanding, returned demonstration, and verbal cues required  HOME EXERCISE PROGRAM: Access Code: [email or web address] URL: [email or web address] Date: 07/27/2023 Prepared by: Chyrel Masson  Exercises - Sit to Stand  - 3 x daily - 7 x weekly - 1 sets - 10 reps - Supine Heel Slide  - 3-5 x daily - 7 x weekly - 1 sets - 10 reps - 5 hold - Supine Heel Slide with Strap (Mirrored)  - 3-5 x daily - 7 x weekly - 1 sets - 10 reps - 5 hold - Supine Quadricep Sets  - 3-5 x daily - 7 x weekly - 1 sets - 10 reps - 5 hold - Seated Long Arc Quad  - 3-5 x daily - 7 x weekly - 1 sets - 5-10 reps - 2 hold - Seated Quad Set  - 3-5 x daily - 7 x weekly - 1 sets - 10 reps - 5 hold  ASSESSMENT:  CLINICAL IMPRESSION: Held leg press/stairs due to back to back visit days.  Fair to good control on balance intervention.  Continued improvement in SLS control to help improve uneven surface ambulation.  Flexion mobility gains still important.   OBJECTIVE IMPAIRMENTS: Abnormal gait, decreased activity tolerance, decreased balance, decreased coordination, decreased endurance, decreased mobility, difficulty walking,  decreased ROM, decreased strength, hypomobility, increased edema, increased fascial restrictions, impaired perceived functional ability, increased muscle spasms, impaired flexibility, improper body mechanics, and pain.   ACTIVITY LIMITATIONS: lifting, bending, sitting, standing, squatting, stairs, transfers, and locomotion level  PARTICIPATION LIMITATIONS: meal prep, cleaning, laundry, interpersonal relationship, driving, shopping, community activity, and occupation  PERSONAL FACTORS:  Medical hx: anemia, back pain, hyperlipidemia, OA rt knee  are also affecting patient's functional outcome.   REHAB POTENTIAL: Good  CLINICAL DECISION MAKING: Stable/uncomplicated  EVALUATION COMPLEXITY: Low   GOALS: Goals reviewed with patient? Yes  SHORT TERM GOALS: (target date for Short term goals are 3 weeks 08/17/2023)   1.  Patient will demonstrate independent use of home exercise program to maintain progress from in clinic treatments.  Goal status: Met 08/16/2023  LONG TERM GOALS: (target dates for all long term goals are 10 weeks  10/05/2023 )   1. Patient will demonstrate/report pain at worst less than or equal to 2/10 to facilitate minimal limitation in daily activity secondary to pain symptoms.  Goal status: on going 08/16/2023   2. Patient will demonstrate independent use of home exercise program to facilitate ability to maintain/progress functional gains from skilled physical therapy services.  Goal status: on going 08/16/2023   3. Patient will demonstrate Patient specific functional scale avg > or = 8/10 to indicate reduced disability due to condition.   Goal status: on going 08/16/2023   4.  Patient will demonstrate Rt LE MMT 5/5 throughout to faciltiate usual transfers, stairs, squatting at [facility] for daily life.   Goal status: on going 08/16/2023   5.  Patient will demonstrate Rt knee 0-110 deg to facilitate transfers, squatting, and ambulation at PLOF s deviation.  Goal status: on going  08/16/2023   6.  Patient will demonstrate bilateral SLS > 15 seconds to facilitate stability in ambulation on even and uneven surfaces.  Goal status: on going 08/16/2023   7.  Patient  will demonstrate ascending/descending stairs reciprocally s UE assist for community integration.   Goal Status: on going 08/16/2023   PLAN:  PT FREQUENCY: 1-2x/week  PT DURATION: 10 weeks  PLANNED INTERVENTIONS: Can include [id number]- PT Re-evaluation, 97110-Therapeutic exercises, 97530- Therapeutic activity, 97112- Neuromuscular re-education, 97535- Self Care, 97140- Manual therapy, [phone]- Gait training, [phone]- Orthotic Fit/training, [phone]- Canalith repositioning, [id number]- Aquatic Therapy, [phone]- Electrical stimulation (unattended), [phone]- Electrical stimulation (manual), [id number] Physical performance testing, 97016- Vasopneumatic device, [id number]- Ultrasound, [id number]- Traction (mechanical), [id number]- Ionotophoresis 4mg /ml Dexamethasone, Patient/Family education, Balance training, Stair training, Taping, Dry Needling, Joint mobilization, Joint manipulation, Spinal manipulation, Spinal mobilization, Scar mobilization, Vestibular training, Visual/preceptual remediation/compensation, DME instructions, Cryotherapy, and Moist heat.  All performed as medically necessary.  All included unless contraindicated  PLAN FOR NEXT SESSION: Progress flexion/strength, compliant balance.     Chyrel Masson, PT, DPT, OCS, ATC 08/16/23  3:44 PM

## 2023-08-21 ENCOUNTER — Ambulatory Visit (INDEPENDENT_AMBULATORY_CARE_PROVIDER_SITE_OTHER): Admitting: Physical Therapy

## 2023-08-21 ENCOUNTER — Encounter: Payer: Self-pay | Admitting: Physical Therapy

## 2023-08-21 DIAGNOSIS — R600 Localized edema: Secondary | ICD-10-CM | POA: Diagnosis not present

## 2023-08-21 DIAGNOSIS — M25561 Pain in right knee: Secondary | ICD-10-CM

## 2023-08-21 DIAGNOSIS — M6281 Muscle weakness (generalized): Secondary | ICD-10-CM | POA: Diagnosis not present

## 2023-08-21 DIAGNOSIS — R262 Difficulty in walking, not elsewhere classified: Secondary | ICD-10-CM | POA: Diagnosis not present

## 2023-08-21 DIAGNOSIS — G8929 Other chronic pain: Secondary | ICD-10-CM

## 2023-08-21 NOTE — Therapy (Signed)
 OUTPATIENT PHYSICAL THERAPY    Patient Name: Victoria [name] MRN: [id number] DOB:1960-08-08, 63 y.o., female Today's Date: 08/21/2023  END OF SESSION:  PT End of Session - 08/21/23 1020     Visit Number 6    Number of Visits 20    Date for PT Re-Evaluation 10/05/23    Authorization Type [employer] 15% coinsurance - 60 visits    Progress Note Due on Visit 10    PT Start Time 1015    PT Stop Time 1103    PT Time Calculation (min) 48 min    Activity Tolerance Patient tolerated treatment well    Behavior During Therapy [facility] for tasks assessed/performed                Past Medical History:  Diagnosis Date   Acute bronchitis 02/15/2023   Allergic rhinitis    Anemia    Anemia, iron deficiency    Back pain    Gallbladder problem    Heavy menses    Hyperglycemia 02/22/2016   Hyperlipidemia    Lipoma    on L arm   Osteoarthritis of right knee 05/2023   Pre-diabetes    SOBOE (shortness of breath on exertion)    Uterine fibroid    Vitamin D deficiency    Past Surgical History:  Procedure Laterality Date   CESAREAN SECTION     [phone]   CHOLECYSTECTOMY  11/07/2011   ENDOMETRIAL ABLATION  2010   fibroids   PARTIAL KNEE ARTHROPLASTY Right 06/01/2023   Procedure: Right unicondylar knee arthroplasty;  Surgeon: Christena Flake, MD;  Location: [facility] ORS;  Service: Orthopedics;  Laterality: Right;   Pelvic U/S and transvaginal  04/27/2009   fibroids x 2   TONSILLECTOMY  1976   TUBAL LIGATION     Uterine ultrasound  06/1998   and endo biopsy- neg   Patient Active Problem List   Diagnosis Date Noted   Routine general medical examination at a health care facility 08/08/2023   Situational anxiety 08/01/2022   Skin lesion 08/01/2022   Encounter for screening mammogram for breast cancer 07/27/2021   Herpes simplex 07/27/2021   Vitamin D deficiency 08/06/2020   At risk for osteoporosis 08/06/2020   History of iron deficiency 08/06/2020   Back pain 07/23/2020   Anemia  07/23/2020   At risk for heart disease 07/23/2020   H/O small bowel obstruction 08/28/2018   Prediabetes 06/01/2018   Panic attacks 02/22/2016   Encounter for routine gynecological examination 09/02/2014   Chest pain 11/20/2011   Special screening for malignant neoplasms, colon 06/08/2011   Hyperlipidemia 06/08/2011   Encounter for general adult medical examination with abnormal findings 06/01/2011   LIPOMA 12/30/2009   FIBROIDS, UTERUS 04/27/2009   Obesity 03/19/2007   ALLERGIC RHINITIS 02/27/2007    PCP: Judy Pimple MD  REFERRING PROVIDER: Christena Flake, MD  REFERRING DIAG: [phone] (ICD-10-CM) - Presence of right artificial knee joint M17.11 (ICD-10-CM) - Unilateral primary osteoarthritis, right knee  THERAPY DIAG:  Chronic pain of right knee  Muscle weakness (generalized)  Difficulty in walking, not elsewhere classified  Localized edema  Rationale for Evaluation and Treatment: Rehabilitation  ONSET DATE: 07/14/2023 Partial Rt TKA  SUBJECTIVE:   SUBJECTIVE STATEMENT: Pt states knee is doing okay overall   PERTINENT HISTORY: Medical hx: anemia, back pain, hyperlipidemia, OA rt knee  PAIN:  NPRS scale: 3/10  Pain location: Rt knee  Pain description: irritation Aggravating factors: prolonged WB activity, sitting prolonged Relieving factors: icing  PRECAUTIONS: None  WEIGHT BEARING RESTRICTIONS: No  FALLS:  Has patient fallen in last 6 months? No  LIVING ENVIRONMENT: Lives in: House/apartment Stairs: stairs to enter with railing both side:  7 steps.  Has second floor but not to bedroom.  Has following equipment at home: has walker, cane.    OCCUPATION: work requires standing/walking.  Work 5-6hrs most of the week.   PLOF: Independent, walking.  Housework.   PATIENT GOALS: Reduce pain  OBJECTIVE:   PATIENT SURVEYS:  Patient-Specific Activity Scoring Scheme  "0" represents "unable to perform." "10" represents "able to perform at prior level. 0 1  2 3 4 5 6 7 8 9 10  (Date and Score)   Activity 07/27/2023    1. Putting shoes on  6    2. Walking at work  5    3. Sitting prolonged 5   4.    5.    Score 5.333    Total score = sum of the activity scores/number of activities Minimum detectable change (90%CI) for average score = 2 points Minimum detectable change (90%CI) for single activity score = 3 points  COGNITION: 07/27/2023 Overall cognitive status: WFL    SENSATION: 07/27/2023 Not tested  EDEMA:  07/27/2023 Localized edema Rt knee, lower leg.   MUSCLE LENGTH: 07/27/2023   POSTURE:  07/27/2023 No Significant postural limitations  PALPATION: 07/27/2023 Mild tenderness incision to light touch.   LOWER EXTREMITY ROM:   ROM A: active, P: passive Right 07/27/2023 Left 07/27/23 Rt 08/01/23 Right 08/07/2023 Right 08/21/23  Hip flexion       Hip extension       Hip abduction       Hip adduction       Hip internal rotation       Hip external rotation       Knee flexion 85 AROM in supine heel slide  A: 80 P: 86 Supine heel slide AROM in supine heel slide 90 A:93 P:95  Knee extension -8 AROM in seated LAQ      Ankle dorsiflexion       Ankle plantarflexion       Ankle inversion       Ankle eversion        (Blank rows = not tested)  LOWER EXTREMITY MMT:  MMT Right 07/27/2023 Left 07/27/2023  Hip flexion 4/5 5/5  Hip extension    Hip abduction    Hip adduction    Hip internal rotation    Hip external rotation    Knee flexion 5/5   Knee extension 4/5 5/5  Ankle dorsiflexion 5/5 5/5  Ankle plantarflexion    Ankle inversion    Ankle eversion     (Blank rows = not tested)  LOWER EXTREMITY SPECIAL TESTS:  07/27/2023 No specific testing  FUNCTIONAL TESTS:  07/27/2023 18 inch chair transfer:  hands on knees  Lt SLS: 12 seconds Rt SLS: 4 seconds  GAIT: 07/27/2023 Independent ambulation with reduced knee flexion in swing and mild decreased in stance on Rt leg.  TODAY'S TREATMENT                                                                          DATE: 08/21/2023 Therex: Recumbent bike rocking 5 min for ROM Nustep lvl 5 for 5 mins for ROM using UE/LE. Gastroc stretch 30 sec X 3 bilat on slantboard Seated Rt leg LAQ c end range pauses 2 x 15 2.5 lbs   Manual: Seated Rt knee flexion c distraction, IR with contralateral leg movement opposite.  Contract relax to Rt quad for flexion mobility gains.   TherActivity (to improve transfers, stairs) Leg press double leg 68 lbs 2 x 10, single leg 50 lbs performed bilaterally 2 X 10 Step ups forward 6 inch step leading with Rt, X 10 without UE support Lateral step ups bilat, 6 inch step X 10 each side without UE support .  Vasopneumatic 34 deg Rt knee in elevation medium compression  X10 min  TODAY'S TREATMENT                                                                          DATE: 08/16/2023 Therex: Nustep lvl 6 10 mins for ROM using UE/LE. Incline gastroc stretch 30 sec x 3 bilateral  Seated hamstring curl Rt leg green band x20 Seated Rt leg LAQ c end range pauses 2 x 15 3 lbs  Neuro Re-ed Tandem stance on foam 1 min x 2 bilateral with occasional HHA  SLS with black mat corner touching contralateral leg x 8 each, performed bilaterally SLS with contralateral leg step over and back 6 inch hurdle x 10 bilateral   Manual: Seated Rt knee flexion c distraction, IR with contralateral leg movement opposite.  Contract relax to Rt quad for flexion mobility gains.   Vasopneumatic 34 deg Rt knee in elevation medium compression  X10 min  TODAY'S TREATMENT                                                                          DATE: 08/15/2023 Therex: Nustep lvl 5 10.5 mins for ROM using UE/LE. Gastroc stretch 30 sec X 3 bilat on slantboard Seated hamstring curl Rt leg green band  2 X 15 Seated Rt leg LAQ c end range pauses 2 x 15 2.5 lbs   Manual: Seated Rt knee flexion c distraction, IR with contralateral leg movement opposite.  Contract relax to Rt quad for flexion mobility gains.   TherActivity (to improve transfers, stairs) Leg press double leg 68 lbs 2 x 10, single leg 50 lbs performed bilaterally 2 X 10 Step ups forward 6 inch step leading with Rt, X 10 Lateral step ups bilat, 6 inch step X 10  each side Step on over and down 6 inch step x 10 WB on Rt leg.   Vasopneumatic 34 deg Rt knee in elevation medium compression  X10 min   PATIENT EDUCATION:  07/27/2023 Education details: HEP, POC Person educated: Patient Education method: Programmer, multimedia, Demonstration, Verbal cues, and Handouts Education comprehension: verbalized understanding, returned demonstration, and verbal cues required  HOME EXERCISE PROGRAM: Access Code: [email or web address] URL: [email or web address] Date: 07/27/2023 Prepared by: Chyrel Masson  Exercises - Sit to Stand  - 3 x daily - 7 x weekly - 1 sets - 10 reps - Supine Heel Slide  - 3-5 x daily - 7 x weekly - 1 sets - 10 reps - 5 hold - Supine Heel Slide with Strap (Mirrored)  - 3-5 x daily - 7 x weekly - 1 sets - 10 reps - 5 hold - Supine Quadricep Sets  - 3-5 x daily - 7 x weekly - 1 sets - 10 reps - 5 hold - Seated Long Arc Quad  - 3-5 x daily - 7 x weekly - 1 sets - 5-10 reps - 2 hold - Seated Quad Set  - 3-5 x daily - 7 x weekly - 1 sets - 10 reps - 5 hold  ASSESSMENT:  CLINICAL IMPRESSION: Ambulation looking much better since I last saw her, she no longer walks with limp. She will need continued knee strength and knee flexion ROM work to improve her overall function. Knee flexion ROM slowly improving but will need to continue to improve to gain more functional knee ROM.  OBJECTIVE IMPAIRMENTS: Abnormal gait, decreased activity tolerance, decreased balance, decreased coordination, decreased endurance, decreased mobility,  difficulty walking, decreased ROM, decreased strength, hypomobility, increased edema, increased fascial restrictions, impaired perceived functional ability, increased muscle spasms, impaired flexibility, improper body mechanics, and pain.   ACTIVITY LIMITATIONS: lifting, bending, sitting, standing, squatting, stairs, transfers, and locomotion level  PARTICIPATION LIMITATIONS: meal prep, cleaning, laundry, interpersonal relationship, driving, shopping, community activity, and occupation  PERSONAL FACTORS:  Medical hx: anemia, back pain, hyperlipidemia, OA rt knee  are also affecting patient's functional outcome.   REHAB POTENTIAL: Good  CLINICAL DECISION MAKING: Stable/uncomplicated  EVALUATION COMPLEXITY: Low   GOALS: Goals reviewed with patient? Yes  SHORT TERM GOALS: (target date for Short term goals are 3 weeks 08/17/2023)   1.  Patient will demonstrate independent use of home exercise program to maintain progress from in clinic treatments.  Goal status: Met 08/16/2023  LONG TERM GOALS: (target dates for all long term goals are 10 weeks  10/05/2023 )   1. Patient will demonstrate/report pain at worst less than or equal to 2/10 to facilitate minimal limitation in daily activity secondary to pain symptoms.  Goal status: on going 08/16/2023   2. Patient will demonstrate independent use of home exercise program to facilitate ability to maintain/progress functional gains from skilled physical therapy services.  Goal status: on going 08/16/2023   3. Patient will demonstrate Patient specific functional scale avg > or = 8/10 to indicate reduced disability due to condition.   Goal status: on going 08/16/2023   4.  Patient will demonstrate Rt LE MMT 5/5 throughout to faciltiate usual transfers, stairs, squatting at [facility] for daily life.   Goal status: on going 08/16/2023   5.  Patient will demonstrate Rt knee 0-110 deg to facilitate transfers, squatting, and ambulation at PLOF s deviation.   Goal status: on going 08/16/2023   6.  Patient will demonstrate bilateral SLS > 15 seconds to facilitate stability in  ambulation on even and uneven surfaces.  Goal status: on going 08/16/2023   7.  Patient will demonstrate ascending/descending stairs reciprocally s UE assist for community integration.   Goal Status: on going 08/16/2023   PLAN:  PT FREQUENCY: 1-2x/week  PT DURATION: 10 weeks  PLANNED INTERVENTIONS: Can include [id number]- PT Re-evaluation, 97110-Therapeutic exercises, 97530- Therapeutic activity, 97112- Neuromuscular re-education, 97535- Self Care, 97140- Manual therapy, [phone]- Gait training, [phone]- Orthotic Fit/training, [phone]- Canalith repositioning, [id number]- Aquatic Therapy, [phone]- Electrical stimulation (unattended), [phone]- Electrical stimulation (manual), [id number] Physical performance testing, 97016- Vasopneumatic device, [id number]- Ultrasound, [id number]- Traction (mechanical), [id number]- Ionotophoresis 4mg /ml Dexamethasone, Patient/Family education, Balance training, Stair training, Taping, Dry Needling, Joint mobilization, Joint manipulation, Spinal manipulation, Spinal mobilization, Scar mobilization, Vestibular training, Visual/preceptual remediation/compensation, DME instructions, Cryotherapy, and Moist heat.  All performed as medically necessary.  All included unless contraindicated  PLAN FOR NEXT SESSION: Progress flexion ROM and overall leg strength as tolerated.   Ivery Quale, PT, DPT 08/21/23 10:21 AM

## 2023-08-23 ENCOUNTER — Ambulatory Visit (INDEPENDENT_AMBULATORY_CARE_PROVIDER_SITE_OTHER): Admitting: Physical Therapy

## 2023-08-23 ENCOUNTER — Encounter: Payer: Self-pay | Admitting: Physical Therapy

## 2023-08-23 DIAGNOSIS — R600 Localized edema: Secondary | ICD-10-CM

## 2023-08-23 DIAGNOSIS — M25561 Pain in right knee: Secondary | ICD-10-CM

## 2023-08-23 DIAGNOSIS — M6281 Muscle weakness (generalized): Secondary | ICD-10-CM | POA: Diagnosis not present

## 2023-08-23 DIAGNOSIS — R262 Difficulty in walking, not elsewhere classified: Secondary | ICD-10-CM

## 2023-08-23 DIAGNOSIS — G8929 Other chronic pain: Secondary | ICD-10-CM

## 2023-08-23 NOTE — Therapy (Signed)
 OUTPATIENT PHYSICAL THERAPY    Patient Name: Victoria [name] MRN: [id number] DOB:22-Mar-1961, 63 y.o., female Today's Date: 08/23/2023  END OF SESSION:  PT End of Session - 08/23/23 1425     Visit Number 7    Number of Visits 20    Date for PT Re-Evaluation 10/05/23    Authorization Type [employer] 15% coinsurance - 60 visits    Progress Note Due on Visit 10    PT Start Time 1420    PT Stop Time 1508    PT Time Calculation (min) 48 min    Activity Tolerance Patient tolerated treatment well    Behavior During Therapy [facility] for tasks assessed/performed                Past Medical History:  Diagnosis Date   Acute bronchitis 02/15/2023   Allergic rhinitis    Anemia    Anemia, iron deficiency    Back pain    Gallbladder problem    Heavy menses    Hyperglycemia 02/22/2016   Hyperlipidemia    Lipoma    on L arm   Osteoarthritis of right knee 05/2023   Pre-diabetes    SOBOE (shortness of breath on exertion)    Uterine fibroid    Vitamin D deficiency    Past Surgical History:  Procedure Laterality Date   CESAREAN SECTION     [phone]   CHOLECYSTECTOMY  11/07/2011   ENDOMETRIAL ABLATION  2010   fibroids   PARTIAL KNEE ARTHROPLASTY Right 06/01/2023   Procedure: Right unicondylar knee arthroplasty;  Surgeon: Christena Flake, MD;  Location: [facility] ORS;  Service: Orthopedics;  Laterality: Right;   Pelvic U/S and transvaginal  04/27/2009   fibroids x 2   TONSILLECTOMY  1976   TUBAL LIGATION     Uterine ultrasound  06/1998   and endo biopsy- neg   Patient Active Problem List   Diagnosis Date Noted   Routine general medical examination at a health care facility 08/08/2023   Situational anxiety 08/01/2022   Skin lesion 08/01/2022   Encounter for screening mammogram for breast cancer 07/27/2021   Herpes simplex 07/27/2021   Vitamin D deficiency 08/06/2020   At risk for osteoporosis 08/06/2020   History of iron deficiency 08/06/2020   Back pain 07/23/2020   Anemia  07/23/2020   At risk for heart disease 07/23/2020   H/O small bowel obstruction 08/28/2018   Prediabetes 06/01/2018   Panic attacks 02/22/2016   Encounter for routine gynecological examination 09/02/2014   Chest pain 11/20/2011   Special screening for malignant neoplasms, colon 06/08/2011   Hyperlipidemia 06/08/2011   Encounter for general adult medical examination with abnormal findings 06/01/2011   LIPOMA 12/30/2009   FIBROIDS, UTERUS 04/27/2009   Obesity 03/19/2007   ALLERGIC RHINITIS 02/27/2007    PCP: Judy Pimple MD  REFERRING PROVIDER: Christena Flake, MD  REFERRING DIAG: [phone] (ICD-10-CM) - Presence of right artificial knee joint M17.11 (ICD-10-CM) - Unilateral primary osteoarthritis, right knee  THERAPY DIAG:  Chronic pain of right knee  Muscle weakness (generalized)  Difficulty in walking, not elsewhere classified  Localized edema  Rationale for Evaluation and Treatment: Rehabilitation  ONSET DATE: 07/14/2023 Partial Rt TKA  SUBJECTIVE:   SUBJECTIVE STATEMENT: Pt states knee is a little more sore and painful today  PERTINENT HISTORY: Medical hx: anemia, back pain, hyperlipidemia, OA rt knee  PAIN:  NPRS scale: 6/10  Pain location: Rt knee  Pain description: irritation Aggravating factors: prolonged WB activity, sitting prolonged  Relieving factors: icing  PRECAUTIONS: None  WEIGHT BEARING RESTRICTIONS: No  FALLS:  Has patient fallen in last 6 months? No  LIVING ENVIRONMENT: Lives in: House/apartment Stairs: stairs to enter with railing both side:  7 steps.  Has second floor but not to bedroom.  Has following equipment at home: has walker, cane.    OCCUPATION: work requires standing/walking.  Work 5-6hrs most of the week.   PLOF: Independent, walking.  Housework.   PATIENT GOALS: Reduce pain  OBJECTIVE:   PATIENT SURVEYS:  Patient-Specific Activity Scoring Scheme  "0" represents "unable to perform." "10" represents "able to perform at  prior level. 0 1 2 3 4 5 6 7 8 9  10 (Date and Score)   Activity 07/27/2023    1. Putting shoes on  6    2. Walking at work  5    3. Sitting prolonged 5   4.    5.    Score 5.333    Total score = sum of the activity scores/number of activities Minimum detectable change (90%CI) for average score = 2 points Minimum detectable change (90%CI) for single activity score = 3 points  COGNITION: 07/27/2023 Overall cognitive status: WFL    SENSATION: 07/27/2023 Not tested  EDEMA:  07/27/2023 Localized edema Rt knee, lower leg.   MUSCLE LENGTH: 07/27/2023   POSTURE:  07/27/2023 No Significant postural limitations  PALPATION: 07/27/2023 Mild tenderness incision to light touch.   LOWER EXTREMITY ROM:   ROM A: active, P: passive Right 07/27/2023 Left 07/27/23 Rt 08/01/23 Right 08/07/2023 Right 08/21/23  Hip flexion       Hip extension       Hip abduction       Hip adduction       Hip internal rotation       Hip external rotation       Knee flexion 85 AROM in supine heel slide  A: 80 P: 86 Supine heel slide AROM in supine heel slide 90 A:93 P:95  Knee extension -8 AROM in seated LAQ      Ankle dorsiflexion       Ankle plantarflexion       Ankle inversion       Ankle eversion        (Blank rows = not tested)  LOWER EXTREMITY MMT:  MMT Right 07/27/2023 Left 07/27/2023  Hip flexion 4/5 5/5  Hip extension    Hip abduction    Hip adduction    Hip internal rotation    Hip external rotation    Knee flexion 5/5   Knee extension 4/5 5/5  Ankle dorsiflexion 5/5 5/5  Ankle plantarflexion    Ankle inversion    Ankle eversion     (Blank rows = not tested)  LOWER EXTREMITY SPECIAL TESTS:  07/27/2023 No specific testing  FUNCTIONAL TESTS:  07/27/2023 18 inch chair transfer:  hands on knees  Lt SLS: 12 seconds Rt SLS: 4 seconds  GAIT: 07/27/2023 Independent ambulation with reduced knee flexion in swing and mild decreased in stance on Rt leg.  TODAY'S TREATMENT                                                                          DATE: 08/23/2023 Therex: Nustep lvl 6 for 10 mins seat #8 for ROM using UE/LE. Gastroc stretch 30 sec X 3 bilat on slantboard Seated Rt leg LAQ c end range pauses 2 x 15 3 lbs Seated knee flexion stretch 5 sec X 10 AAROM   Manual: Seated Rt knee flexion c distraction, IR with contralateral leg movement opposite.  Contract relax to Rt quad for flexion mobility gains.   TherActivity (to improve transfers, stairs) Leg press double leg 68 lbs 2 x 12, single leg 50 lbs performed bilaterally 2 X 10 Step ups forward 6 inch step leading with Rt, X 10 without UE support Standing lunge stretch for knee flexion at treadmill 5 sec X 10 .  Vasopneumatic 34 deg Rt knee in elevation medium compression  X10 min  TODAY'S TREATMENT                                                                          DATE: 08/21/2023 Therex: Recumbent bike rocking 5 min for ROM Nustep lvl 5 for 5 mins for ROM using UE/LE. Gastroc stretch 30 sec X 3 bilat on slantboard Seated Rt leg LAQ c end range pauses 2 x 15 2.5 lbs   Manual: Seated Rt knee flexion c distraction, IR with contralateral leg movement opposite.  Contract relax to Rt quad for flexion mobility gains.   TherActivity (to improve transfers, stairs) Leg press double leg 68 lbs 2 x 10, single leg 50 lbs performed bilaterally 2 X 10 Step ups forward 6 inch step leading with Rt, X 10 without UE support Lateral step ups bilat, 6 inch step X 10 each side without UE support .  Vasopneumatic 34 deg Rt knee in elevation medium compression  X10 min  TODAY'S TREATMENT                                                                          DATE: 08/16/2023 Therex: Nustep lvl 6 10 mins for ROM using UE/LE. Incline gastroc stretch 30  sec x 3 bilateral  Seated hamstring curl Rt leg green band x20 Seated Rt leg LAQ c end range pauses 2 x 15 3 lbs  Neuro Re-ed Tandem stance on foam 1 min x 2 bilateral with occasional HHA  SLS with black mat corner touching contralateral leg x 8 each, performed bilaterally SLS with contralateral leg step over and back 6 inch hurdle x 10 bilateral   Manual: Seated Rt knee flexion c distraction, IR with contralateral leg movement opposite.  Contract  relax to Rt quad for flexion mobility gains.   Vasopneumatic 34 deg Rt knee in elevation medium compression  X10 min  TODAY'S TREATMENT                                                                          DATE: 08/15/2023 Therex: Nustep lvl 5 10.5 mins for ROM using UE/LE. Gastroc stretch 30 sec X 3 bilat on slantboard Seated hamstring curl Rt leg green band 2 X 15 Seated Rt leg LAQ c end range pauses 2 x 15 2.5 lbs   Manual: Seated Rt knee flexion c distraction, IR with contralateral leg movement opposite.  Contract relax to Rt quad for flexion mobility gains.   TherActivity (to improve transfers, stairs) Leg press double leg 68 lbs 2 x 10, single leg 50 lbs performed bilaterally 2 X 10 Step ups forward 6 inch step leading with Rt, X 10 Lateral step ups bilat, 6 inch step X 10 each side Step on over and down 6 inch step x 10 WB on Rt leg.   Vasopneumatic 34 deg Rt knee in elevation medium compression  X10 min   PATIENT EDUCATION:  07/27/2023 Education details: HEP, POC Person educated: Patient Education method: Programmer, multimedia, Demonstration, Verbal cues, and Handouts Education comprehension: verbalized understanding, returned demonstration, and verbal cues required  HOME EXERCISE PROGRAM: Access Code: [email or web address] URL: [email or web address] Date: 07/27/2023 Prepared by: Chyrel Masson  Exercises - Sit to Stand  - 3 x daily - 7 x weekly - 1 sets - 10 reps - Supine Heel Slide  - 3-5 x daily - 7 x weekly - 1 sets -  10 reps - 5 hold - Supine Heel Slide with Strap (Mirrored)  - 3-5 x daily - 7 x weekly - 1 sets - 10 reps - 5 hold - Supine Quadricep Sets  - 3-5 x daily - 7 x weekly - 1 sets - 10 reps - 5 hold - Seated Long Arc Quad  - 3-5 x daily - 7 x weekly - 1 sets - 5-10 reps - 2 hold - Seated Quad Set  - 3-5 x daily - 7 x weekly - 1 sets - 10 reps - 5 hold  ASSESSMENT:  CLINICAL IMPRESSION: She is doing quite well overall but is still limited with some knee flexion ROM limiting functional activities. We are working hard to improve with PT.   OBJECTIVE IMPAIRMENTS: Abnormal gait, decreased activity tolerance, decreased balance, decreased coordination, decreased endurance, decreased mobility, difficulty walking, decreased ROM, decreased strength, hypomobility, increased edema, increased fascial restrictions, impaired perceived functional ability, increased muscle spasms, impaired flexibility, improper body mechanics, and pain.   ACTIVITY LIMITATIONS: lifting, bending, sitting, standing, squatting, stairs, transfers, and locomotion level  PARTICIPATION LIMITATIONS: meal prep, cleaning, laundry, interpersonal relationship, driving, shopping, community activity, and occupation  PERSONAL FACTORS:  Medical hx: anemia, back pain, hyperlipidemia, OA rt knee  are also affecting patient's functional outcome.   REHAB POTENTIAL: Good  CLINICAL DECISION MAKING: Stable/uncomplicated  EVALUATION COMPLEXITY: Low   GOALS: Goals reviewed with patient? Yes  SHORT TERM GOALS: (target date for Short term goals are 3 weeks 08/17/2023)   1.  Patient will demonstrate independent use of home exercise program to maintain progress from  in clinic treatments.  Goal status: Met 08/16/2023  LONG TERM GOALS: (target dates for all long term goals are 10 weeks  10/05/2023 )   1. Patient will demonstrate/report pain at worst less than or equal to 2/10 to facilitate minimal limitation in daily activity secondary to pain  symptoms.  Goal status: on going 08/16/2023   2. Patient will demonstrate independent use of home exercise program to facilitate ability to maintain/progress functional gains from skilled physical therapy services.  Goal status: on going 08/16/2023   3. Patient will demonstrate Patient specific functional scale avg > or = 8/10 to indicate reduced disability due to condition.   Goal status: on going 08/16/2023   4.  Patient will demonstrate Rt LE MMT 5/5 throughout to faciltiate usual transfers, stairs, squatting at [facility] for daily life.   Goal status: on going 08/16/2023   5.  Patient will demonstrate Rt knee 0-110 deg to facilitate transfers, squatting, and ambulation at PLOF s deviation.  Goal status: on going 08/16/2023   6.  Patient will demonstrate bilateral SLS > 15 seconds to facilitate stability in ambulation on even and uneven surfaces.  Goal status: on going 08/16/2023   7.  Patient will demonstrate ascending/descending stairs reciprocally s UE assist for community integration.   Goal Status: on going 08/16/2023   PLAN:  PT FREQUENCY: 1-2x/week  PT DURATION: 10 weeks  PLANNED INTERVENTIONS: Can include 16109- PT Re-evaluation, 97110-Therapeutic exercises, 97530- Therapeutic activity, 97112- Neuromuscular re-education, 97535- Self Care, 97140- Manual therapy, [phone]- Gait training, [phone]- Orthotic Fit/training, [phone]- Canalith repositioning, [id number]- Aquatic Therapy, [phone]- Electrical stimulation (unattended), [phone]- Electrical stimulation (manual), [id number] Physical performance testing, 97016- Vasopneumatic device, [id number]- Ultrasound, [id number]- Traction (mechanical), [id number]- Ionotophoresis 4mg /ml Dexamethasone, Patient/Family education, Balance training, Stair training, Taping, Dry Needling, Joint mobilization, Joint manipulation, Spinal manipulation, Spinal mobilization, Scar mobilization, Vestibular training, Visual/preceptual remediation/compensation, DME instructions, Cryotherapy, and Moist  heat.  All performed as medically necessary.  All included unless contraindicated  PLAN FOR NEXT SESSION: Progress flexion ROM and overall leg strength as tolerated.   Ivery Quale, PT, DPT 08/23/23 2:26 PM

## 2023-08-28 ENCOUNTER — Ambulatory Visit (INDEPENDENT_AMBULATORY_CARE_PROVIDER_SITE_OTHER): Admitting: Rehabilitative and Restorative Service Providers"

## 2023-08-28 ENCOUNTER — Encounter: Payer: Self-pay | Admitting: Rehabilitative and Restorative Service Providers"

## 2023-08-28 DIAGNOSIS — M6281 Muscle weakness (generalized): Secondary | ICD-10-CM | POA: Diagnosis not present

## 2023-08-28 DIAGNOSIS — M25561 Pain in right knee: Secondary | ICD-10-CM | POA: Diagnosis not present

## 2023-08-28 DIAGNOSIS — R262 Difficulty in walking, not elsewhere classified: Secondary | ICD-10-CM

## 2023-08-28 DIAGNOSIS — G8929 Other chronic pain: Secondary | ICD-10-CM

## 2023-08-28 DIAGNOSIS — R600 Localized edema: Secondary | ICD-10-CM

## 2023-08-28 NOTE — Therapy (Signed)
 OUTPATIENT PHYSICAL THERAPY    Patient Name: Victoria [name] MRN: [id number] DOB:Oct 02, 1960, 63 y.o., female Today's Date: 08/28/2023  END OF SESSION:  PT End of Session - 08/28/23 1035     Visit Number 8    Number of Visits 20    Date for PT Re-Evaluation 10/05/23    Authorization Type [employer] 15% coinsurance - 60 visits    Progress Note Due on Visit 10    PT Start Time 1011    PT Stop Time 1103    PT Time Calculation (min) 52 min    Activity Tolerance Patient tolerated treatment well    Behavior During Therapy WFL for tasks assessed/performed                 Past Medical History:  Diagnosis Date   Acute bronchitis 02/15/2023   Allergic rhinitis    Anemia    Anemia, iron deficiency    Back pain    Gallbladder problem    Heavy menses    Hyperglycemia 02/22/2016   Hyperlipidemia    Lipoma    on L arm   Osteoarthritis of right knee 05/2023   Pre-diabetes    SOBOE (shortness of breath on exertion)    Uterine fibroid    Vitamin D deficiency    Past Surgical History:  Procedure Laterality Date   CESAREAN SECTION     [phone]   CHOLECYSTECTOMY  11/07/2011   ENDOMETRIAL ABLATION  2010   fibroids   PARTIAL KNEE ARTHROPLASTY Right 06/01/2023   Procedure: Right unicondylar knee arthroplasty;  Surgeon: Christena Flake, MD;  Location: [facility];  Service: Orthopedics;  Laterality: Right;   Pelvic U/S and transvaginal  04/27/2009   fibroids x 2   TONSILLECTOMY  1976   TUBAL LIGATION     Uterine ultrasound  06/1998   and endo biopsy- neg   Patient Active Problem List   Diagnosis Date Noted   Routine general medical examination at a health care facility 08/08/2023   Situational anxiety 08/01/2022   Skin lesion 08/01/2022   Encounter for screening mammogram for breast cancer 07/27/2021   Herpes simplex 07/27/2021   Vitamin D deficiency 08/06/2020   At risk for osteoporosis 08/06/2020   History of iron deficiency 08/06/2020   Back pain 07/23/2020   Anemia  07/23/2020   At risk for heart disease 07/23/2020   H/O small bowel obstruction 08/28/2018   Prediabetes 06/01/2018   Panic attacks 02/22/2016   Encounter for routine gynecological examination 09/02/2014   Chest pain 11/20/2011   Special screening for malignant neoplasms, colon 06/08/2011   Hyperlipidemia 06/08/2011   Encounter for general adult medical examination with abnormal findings 06/01/2011   LIPOMA 12/30/2009   FIBROIDS, UTERUS 04/27/2009   Obesity 03/19/2007   ALLERGIC RHINITIS 02/27/2007    PCP: Judy Pimple MD  REFERRING PROVIDER: Christena Flake, MD  REFERRING DIAG: [phone] (ICD-10-CM) - Presence of right artificial knee joint M17.11 (ICD-10-CM) - Unilateral primary osteoarthritis, right knee  THERAPY DIAG:  Chronic pain of right knee  Muscle weakness (generalized)  Difficulty in walking, not elsewhere classified  Localized edema  Rationale for Evaluation and Treatment: Rehabilitation  ONSET DATE: 07/14/2023 Partial Rt TKA  SUBJECTIVE:   SUBJECTIVE STATEMENT: Pt indicated continued complaints of stiffness as well as swelling after work.   PERTINENT HISTORY: Medical hx: anemia, back pain, hyperlipidemia, OA rt knee  PAIN:  NPRS scale: "nothing major." Pain location: Rt knee  Pain description: irritation Aggravating factors: prolonged WB  activity, sitting prolonged Relieving factors: icing  PRECAUTIONS: None  WEIGHT BEARING RESTRICTIONS: No  FALLS:  Has patient fallen in last 6 months? No  LIVING ENVIRONMENT: Lives in: House/apartment Stairs: stairs to enter with railing both side:  7 steps.  Has second floor but not to bedroom.  Has following equipment at home: has walker, cane.    OCCUPATION: work requires standing/walking.  Work 5-6hrs most of the week.   PLOF: Independent, walking.  Housework.   PATIENT GOALS: Reduce pain  OBJECTIVE:   PATIENT SURVEYS:  Patient-Specific Activity Scoring Scheme  "0" represents "unable to perform."  "10" represents "able to perform at prior level. 0 1 2 3 4 5 6 7 8 9  10 (Date and Score)   Activity 07/27/2023    1. Putting shoes on  6    2. Walking at work  5    3. Sitting prolonged 5   4.    5.    Score 5.333    Total score = sum of the activity scores/number of activities Minimum detectable change (90%CI) for average score = 2 points Minimum detectable change (90%CI) for single activity score = 3 points  COGNITION: 07/27/2023 Overall cognitive status: WFL    SENSATION: 07/27/2023 Not tested  EDEMA:  07/27/2023 Localized edema Rt knee, lower leg.   MUSCLE LENGTH: 07/27/2023   POSTURE:  07/27/2023 No Significant postural limitations  PALPATION: 07/27/2023 Mild tenderness incision to light touch.   LOWER EXTREMITY ROM:   ROM A: active, P: passive Right 07/27/2023 Left 07/27/23 Rt 08/01/23 Right 08/07/2023 Right 08/21/23 Right 08/28/2023  Hip flexion        Hip extension        Hip abduction        Hip adduction        Hip internal rotation        Hip external rotation        Knee flexion 85 AROM in supine heel slide  A: 80 P: 86 Supine heel slide AROM in supine heel slide 90 A:93 P:95 AROM in supine heel slide: 96    Knee extension -8 AROM in seated LAQ       Ankle dorsiflexion        Ankle plantarflexion        Ankle inversion        Ankle eversion         (Blank rows = not tested)  LOWER EXTREMITY MMT:  MMT Right 07/27/2023 Left 07/27/2023  Hip flexion 4/5 5/5  Hip extension    Hip abduction    Hip adduction    Hip internal rotation    Hip external rotation    Knee flexion 5/5   Knee extension 4/5 5/5  Ankle dorsiflexion 5/5 5/5  Ankle plantarflexion    Ankle inversion    Ankle eversion     (Blank rows = not tested)  LOWER EXTREMITY SPECIAL TESTS:  07/27/2023 No specific testing  FUNCTIONAL TESTS:  07/27/2023 18 inch chair transfer:  hands on knees  Lt SLS: 12 seconds Rt SLS: 4 seconds  GAIT: 07/27/2023 Independent ambulation  with reduced knee flexion in swing and mild decreased in stance on Rt leg.  TODAY'S TREATMENT                                                                          DATE: 08/28/2023 Therex: Nustep lvl 6 for 9 mins seat #8 for ROM using UE/LE. Gastroc stretch 30 sec X 3 bilat on slantboard Seated Rt leg LAQ c end range pauses 2 x 15 4 lbs Seated knee flexion stretch 5 sec X 10 AAROM Seated quad set 5 sec hold x 10 Rt leg Seated quad set with SLR slowly Rt leg x 15 Supine Rt knee LAQ in 90 deg hip flexion for swelling muscle pump 2 x 10     Manual: Seated Rt knee flexion c distraction, IR with contralateral leg movement opposite.  Contract relax to Rt quad for flexion mobility gains.   TherActivity (to improve transfers, stairs) Leg press double leg 87 lbs 2 x15  single leg 50 lbs Rt leg x 15   - performed in max knee flexion tolerated  .  Vasopneumatic 34 deg Rt knee, Rt ankle  in elevation medium compression  X10 min   TODAY'S TREATMENT                                                                          DATE: 08/23/2023 Therex: Nustep lvl 6 for 10 mins seat #8 for ROM using UE/LE. Gastroc stretch 30 sec X 3 bilat on slantboard Seated Rt leg LAQ c end range pauses 2 x 15 3 lbs Seated knee flexion stretch 5 sec X 10 AAROM   Manual: Seated Rt knee flexion c distraction, IR with contralateral leg movement opposite.  Contract relax to Rt quad for flexion mobility gains.   TherActivity (to improve transfers, stairs) Leg press double leg 68 lbs 2 x 12, single leg 50 lbs performed bilaterally 2 X 10 Step ups forward 6 inch step leading with Rt, X 10 without UE support Standing lunge stretch for knee flexion at treadmill 5 sec X 10 .  Vasopneumatic 34 deg Rt knee in elevation medium compression  X10 min  TODAY'S  TREATMENT                                                                          DATE: 08/21/2023 Therex: Recumbent bike rocking 5 min for ROM Nustep lvl 5 for 5 mins for ROM using UE/LE. Gastroc stretch 30 sec X 3 bilat on slantboard Seated Rt leg LAQ c end range pauses 2 x 15 2.5 lbs   Manual: Seated Rt knee flexion c distraction, IR with contralateral leg movement opposite.  Contract relax to Rt quad for flexion mobility gains.   TherActivity (to improve transfers, stairs) Leg  press double leg 68 lbs 2 x 10, single leg 50 lbs performed bilaterally 2 X 10 Step ups forward 6 inch step leading with Rt, X 10 without UE support Lateral step ups bilat, 6 inch step X 10 each side without UE support .  Vasopneumatic 34 deg Rt knee in elevation medium compression  X10 min  TODAY'S TREATMENT                                                                          DATE: 08/16/2023 Therex: Nustep lvl 6 10 mins for ROM using UE/LE. Incline gastroc stretch 30 sec x 3 bilateral  Seated hamstring curl Rt leg green band x20 Seated Rt leg LAQ c end range pauses 2 x 15 3 lbs  Neuro Re-ed Tandem stance on foam 1 min x 2 bilateral with occasional HHA  SLS with black mat corner touching contralateral leg x 8 each, performed bilaterally SLS with contralateral leg step over and back 6 inch hurdle x 10 bilateral   Manual: Seated Rt knee flexion c distraction, IR with contralateral leg movement opposite.  Contract relax to Rt quad for flexion mobility gains.   Vasopneumatic 34 deg Rt knee in elevation medium compression  X10 min   PATIENT EDUCATION:  07/27/2023 Education details: HEP, POC Person educated: Patient Education method: Programmer, multimedia, Demonstration, Verbal cues, and Handouts Education comprehension: verbalized understanding, returned demonstration, and verbal cues required  HOME EXERCISE PROGRAM: Access Code: [email or web address] URL: [email or web address] Date: 07/27/2023 Prepared  by: Bonna Bustard  Exercises - Sit to Stand  - 3 x daily - 7 x weekly - 1 sets - 10 reps - Supine Heel Slide  - 3-5 x daily - 7 x weekly - 1 sets - 10 reps - 5 hold - Supine Heel Slide with Strap (Mirrored)  - 3-5 x daily - 7 x weekly - 1 sets - 10 reps - 5 hold - Supine Quadricep Sets  - 3-5 x daily - 7 x weekly - 1 sets - 10 reps - 5 hold - Seated Long Arc Quad  - 3-5 x daily - 7 x weekly - 1 sets - 5-10 reps - 2 hold - Seated Quad Set  - 3-5 x daily - 7 x weekly - 1 sets - 10 reps - 5 hold  ASSESSMENT:  CLINICAL IMPRESSION: Continued discussion about factors into flexion mobility (surgery, edema, activity in WB during day).  Swelling continued to be present and limiting factors for activity, noted in Rt knee and lower leg.   OBJECTIVE IMPAIRMENTS: Abnormal gait, decreased activity tolerance, decreased balance, decreased coordination, decreased endurance, decreased mobility, difficulty walking, decreased ROM, decreased strength, hypomobility, increased edema, increased fascial restrictions, impaired perceived functional ability, increased muscle spasms, impaired flexibility, improper body mechanics, and pain.   ACTIVITY LIMITATIONS: lifting, bending, sitting, standing, squatting, stairs, transfers, and locomotion level  PARTICIPATION LIMITATIONS: meal prep, cleaning, laundry, interpersonal relationship, driving, shopping, community activity, and occupation  PERSONAL FACTORS:  Medical hx: anemia, back pain, hyperlipidemia, OA rt knee  are also affecting patient's functional outcome.   REHAB POTENTIAL: Good  CLINICAL DECISION MAKING: Stable/uncomplicated  EVALUATION COMPLEXITY: Low   GOALS: Goals reviewed with patient? Yes  SHORT TERM GOALS: (target date for Short  term goals are 3 weeks 08/17/2023)   1.  Patient will demonstrate independent use of home exercise program to maintain progress from in clinic treatments.  Goal status: Met 08/16/2023  LONG TERM GOALS: (target dates for  all long term goals are 10 weeks  10/05/2023 )   1. Patient will demonstrate/report pain at worst less than or equal to 2/10 to facilitate minimal limitation in daily activity secondary to pain symptoms.  Goal status: on going 08/16/2023   2. Patient will demonstrate independent use of home exercise program to facilitate ability to maintain/progress functional gains from skilled physical therapy services.  Goal status: on going 08/16/2023   3. Patient will demonstrate Patient specific functional scale avg > or = 8/10 to indicate reduced disability due to condition.   Goal status: on going 08/16/2023   4.  Patient will demonstrate Rt LE MMT 5/5 throughout to faciltiate usual transfers, stairs, squatting at [facility] for daily life.   Goal status: on going 08/16/2023   5.  Patient will demonstrate Rt knee 0-110 deg to facilitate transfers, squatting, and ambulation at PLOF s deviation.  Goal status: on going 08/16/2023   6.  Patient will demonstrate bilateral SLS > 15 seconds to facilitate stability in ambulation on even and uneven surfaces.  Goal status: on going 08/16/2023   7.  Patient will demonstrate ascending/descending stairs reciprocally s UE assist for community integration.   Goal Status: on going 08/16/2023   PLAN:  PT FREQUENCY: 1-2x/week  PT DURATION: 10 weeks  PLANNED INTERVENTIONS: Can include 16109- PT Re-evaluation, 97110-Therapeutic exercises, 97530- Therapeutic activity, 97112- Neuromuscular re-education, 97535- Self Care, 97140- Manual therapy, [phone]- Gait training, [phone]- Orthotic Fit/training, [phone]- Canalith repositioning, [id number]- Aquatic Therapy, [phone]- Electrical stimulation (unattended), [phone]- Electrical stimulation (manual), [id number] Physical performance testing, 97016- Vasopneumatic device, [id number]- Ultrasound, [id number]- Traction (mechanical), [id number]- Ionotophoresis 4mg /ml Dexamethasone, Patient/Family education, Balance training, Stair training, Taping, Dry Needling, Joint  mobilization, Joint manipulation, Spinal manipulation, Spinal mobilization, Scar mobilization, Vestibular training, Visual/preceptual remediation/compensation, DME instructions, Cryotherapy, and Moist heat.  All performed as medically necessary.  All included unless contraindicated  PLAN FOR NEXT SESSION: Continue emphasis on reducing swelling response.    Bonna Bustard, PT, DPT, OCS, ATC 08/28/23  10:53 AM

## 2023-08-30 ENCOUNTER — Encounter: Admitting: Rehabilitative and Restorative Service Providers"

## 2023-09-04 ENCOUNTER — Encounter: Payer: Self-pay | Admitting: Rehabilitative and Restorative Service Providers"

## 2023-09-04 ENCOUNTER — Ambulatory Visit (INDEPENDENT_AMBULATORY_CARE_PROVIDER_SITE_OTHER): Admitting: Rehabilitative and Restorative Service Providers"

## 2023-09-04 DIAGNOSIS — R600 Localized edema: Secondary | ICD-10-CM

## 2023-09-04 DIAGNOSIS — M6281 Muscle weakness (generalized): Secondary | ICD-10-CM

## 2023-09-04 DIAGNOSIS — M25561 Pain in right knee: Secondary | ICD-10-CM

## 2023-09-04 DIAGNOSIS — R262 Difficulty in walking, not elsewhere classified: Secondary | ICD-10-CM

## 2023-09-04 DIAGNOSIS — G8929 Other chronic pain: Secondary | ICD-10-CM

## 2023-09-04 NOTE — Therapy (Signed)
 OUTPATIENT PHYSICAL THERAPY    Patient Name: Victoria [name] MRN: [id number] DOB:22-Oct-1960, 63 y.o., female Today's Date: 09/04/2023  END OF SESSION:  PT End of Session - 09/04/23 1018     Visit Number 9    Number of Visits 20    Date for PT Re-Evaluation 10/05/23    Authorization Type [employer] 15% coinsurance - 60 visits    Progress Note Due on Visit 10    PT Start Time 1009    PT Stop Time 1059    PT Time Calculation (min) 50 min    Activity Tolerance Patient tolerated treatment well    Behavior During Therapy [facility] for tasks assessed/performed                  Past Medical History:  Diagnosis Date   Acute bronchitis 02/15/2023   Allergic rhinitis    Anemia    Anemia, iron deficiency    Back pain    Gallbladder problem    Heavy menses    Hyperglycemia 02/22/2016   Hyperlipidemia    Lipoma    on L arm   Osteoarthritis of right knee 05/2023   Pre-diabetes    SOBOE (shortness of breath on exertion)    Uterine fibroid    Vitamin D  deficiency    Past Surgical History:  Procedure Laterality Date   CESAREAN SECTION     [phone]   CHOLECYSTECTOMY  11/07/2011   ENDOMETRIAL ABLATION  2010   fibroids   PARTIAL KNEE ARTHROPLASTY Right 06/01/2023   Procedure: Right unicondylar knee arthroplasty;  Surgeon: Elner Hahn, MD;  Location: [facility] ORS;  Service: Orthopedics;  Laterality: Right;   Pelvic U/S and transvaginal  04/27/2009   fibroids x 2   TONSILLECTOMY  1976   TUBAL LIGATION     Uterine ultrasound  06/1998   and endo biopsy- neg   Patient Active Problem List   Diagnosis Date Noted   Routine general medical examination at a health care facility 08/08/2023   Situational anxiety 08/01/2022   Skin lesion 08/01/2022   Encounter for screening mammogram for breast cancer 07/27/2021   Herpes simplex 07/27/2021   Vitamin D  deficiency 08/06/2020   At risk for osteoporosis 08/06/2020   History of iron deficiency 08/06/2020   Back pain 07/23/2020    Anemia 07/23/2020   At risk for heart disease 07/23/2020   H/O small bowel obstruction 08/28/2018   Prediabetes 06/01/2018   Panic attacks 02/22/2016   Encounter for routine gynecological examination 09/02/2014   Chest pain 11/20/2011   Special screening for malignant neoplasms, colon 06/08/2011   Hyperlipidemia 06/08/2011   Encounter for general adult medical examination with abnormal findings 06/01/2011   LIPOMA 12/30/2009   FIBROIDS, UTERUS 04/27/2009   Obesity 03/19/2007   ALLERGIC RHINITIS 02/27/2007    PCP: Clemens Curt MD  REFERRING PROVIDER: Elner Hahn, MD  REFERRING DIAG: [phone] (ICD-10-CM) - Presence of right artificial knee joint M17.11 (ICD-10-CM) - Unilateral primary osteoarthritis, right knee  THERAPY DIAG:  Chronic pain of right knee  Muscle weakness (generalized)  Difficulty in walking, not elsewhere classified  Localized edema  Rationale for Evaluation and Treatment: Rehabilitation  ONSET DATE: 07/14/2023 Partial Rt TKA  SUBJECTIVE:   SUBJECTIVE STATEMENT: Pt indicated no pain upon arrival.  Reported getting some medicine from MD for swelling but not changed at this point.   PERTINENT HISTORY: Medical hx: anemia, back pain, hyperlipidemia, OA rt knee  PAIN:  NPRS scale: 0/10 upon arrival.  Pain location: Rt knee  Pain description: irritation Aggravating factors: prolonged WB activity, sitting prolonged Relieving factors: icing  PRECAUTIONS: None  WEIGHT BEARING RESTRICTIONS: No  FALLS:  Has patient fallen in last 6 months? No  LIVING ENVIRONMENT: Lives in: House/apartment Stairs: stairs to enter with railing both side:  7 steps.  Has second floor but not to bedroom.  Has following equipment at home: has walker, cane.    OCCUPATION: work requires standing/walking.  Work 5-6hrs most of the week.   PLOF: Independent, walking.  Housework.   PATIENT GOALS: Reduce pain  OBJECTIVE:   PATIENT SURVEYS:  Patient-Specific Activity  Scoring Scheme  "0" represents "unable to perform." "10" represents "able to perform at prior level. 0 1 2 3 4 5 6 7 8 9  10 (Date and Score)   Activity 07/27/2023    1. Putting shoes on  6    2. Walking at work  5    3. Sitting prolonged 5   4.    5.    Score 5.333    Total score = sum of the activity scores/number of activities Minimum detectable change (90%CI) for average score = 2 points Minimum detectable change (90%CI) for single activity score = 3 points  COGNITION: 07/27/2023 Overall cognitive status: WFL    SENSATION: 07/27/2023 Not tested  EDEMA:  07/27/2023 Localized edema Rt knee, lower leg.   MUSCLE LENGTH: 07/27/2023   POSTURE:  07/27/2023 No Significant postural limitations  PALPATION: 07/27/2023 Mild tenderness incision to light touch.   LOWER EXTREMITY ROM:   ROM A: active, P: passive Right 07/27/2023 Left 07/27/23 Rt 08/01/23 Right 08/07/2023 Right 08/21/23 Right 08/28/2023  Hip flexion        Hip extension        Hip abduction        Hip adduction        Hip internal rotation        Hip external rotation        Knee flexion 85 AROM in supine heel slide  A: 80 P: 86 Supine heel slide AROM in supine heel slide 90 A:93 P:95 AROM in supine heel slide: 96    Knee extension -8 AROM in seated LAQ       Ankle dorsiflexion        Ankle plantarflexion        Ankle inversion        Ankle eversion         (Blank rows = not tested)  LOWER EXTREMITY MMT:  MMT Right 07/27/2023 Left 07/27/2023  Hip flexion 4/5 5/5  Hip extension    Hip abduction    Hip adduction    Hip internal rotation    Hip external rotation    Knee flexion 5/5   Knee extension 4/5 5/5  Ankle dorsiflexion 5/5 5/5  Ankle plantarflexion    Ankle inversion    Ankle eversion     (Blank rows = not tested)  LOWER EXTREMITY SPECIAL TESTS:  07/27/2023 No specific testing  FUNCTIONAL TESTS:  07/27/2023 18 inch chair transfer:  hands on knees  Lt SLS: 12 seconds Rt SLS: 4  seconds  GAIT: 07/27/2023 Independent ambulation with reduced knee flexion in swing and mild decreased in stance on Rt leg.  TODAY'S TREATMENT                                                                          DATE: 09/04/2023 Therex: Recumbent bike partial revolutions stretching 5 sec 8 mins, seat 4 Gastroc stretch 30 sec X 3 bilat on slantboard  Neuro Re-ed (improve neuromuscular recruitment, balance control) SLS on foam with contralateral leg corner touching x 15 WB on Rt leg, x 8 on Lt leg  SLS with contralateral leg step over and back 6 inch hurdle with focus on knee flexion x 15 bilateral   Manual: Seated Rt knee flexion c distraction, IR with contralateral leg movement opposite.  Contract relax to Rt quad for flexion mobility gains.   TherActivity (to improve transfers, stairs) Leg press double leg 87 lbs  x15  single leg 50 lbs Rt leg 2 x 15   - performed in max knee flexion tolerated Step up forward 6 inch step x 15 WB on Rt leg Step on over and down attempts on 6 inch step x 5 but limited by pain/knee flexion on Rt leg.   .  Vasopneumatic 34 deg Rt knee  in elevation medium compression  X10 min  TODAY'S TREATMENT                                                                          DATE: 08/28/2023 Therex: Nustep lvl 6 for 9 mins seat #8 for ROM using UE/LE. Gastroc stretch 30 sec X 3 bilat on slantboard Seated Rt leg LAQ c end range pauses 2 x 15 4 lbs Seated knee flexion stretch 5 sec X 10 AAROM Seated quad set 5 sec hold x 10 Rt leg Seated quad set with SLR slowly Rt leg x 15 Supine Rt knee LAQ in 90 deg hip flexion for swelling muscle pump 2 x 10     Manual: Seated Rt knee flexion c distraction, IR with contralateral leg movement opposite.  Contract relax to Rt quad for flexion mobility  gains.   TherActivity (to improve transfers, stairs) Leg press double leg 87 lbs 2 x15  single leg 50 lbs Rt leg x 15   - performed in max knee flexion tolerated  .  Vasopneumatic 34 deg Rt knee, Rt ankle  in elevation medium compression  X10 min   TODAY'S TREATMENT                                                                          DATE: 08/23/2023 Therex: Nustep lvl 6 for 10 mins seat #8 for ROM using UE/LE. Gastroc stretch 30 sec X 3 bilat on slantboard Seated Rt leg LAQ c end range pauses 2  x 15 3 lbs Seated knee flexion stretch 5 sec X 10 AAROM   Manual: Seated Rt knee flexion c distraction, IR with contralateral leg movement opposite.  Contract relax to Rt quad for flexion mobility gains.   TherActivity (to improve transfers, stairs) Leg press double leg 68 lbs 2 x 12, single leg 50 lbs performed bilaterally 2 X 10 Step ups forward 6 inch step leading with Rt, X 10 without UE support Standing lunge stretch for knee flexion at treadmill 5 sec X 10 .  Vasopneumatic 34 deg Rt knee in elevation medium compression  X10 min  TODAY'S TREATMENT                                                                          DATE: 08/21/2023 Therex: Recumbent bike rocking 5 min for ROM Nustep lvl 5 for 5 mins for ROM using UE/LE. Gastroc stretch 30 sec X 3 bilat on slantboard Seated Rt leg LAQ c end range pauses 2 x 15 2.5 lbs   Manual: Seated Rt knee flexion c distraction, IR with contralateral leg movement opposite.  Contract relax to Rt quad for flexion mobility gains.   TherActivity (to improve transfers, stairs) Leg press double leg 68 lbs 2 x 10, single leg 50 lbs performed bilaterally 2 X 10 Step ups forward 6 inch step leading with Rt, X 10 without UE support Lateral step ups bilat, 6 inch step X 10 each side without UE support .  Vasopneumatic 34 deg Rt knee in elevation medium compression  X10 min  PATIENT EDUCATION:  07/27/2023 Education details: HEP, POC Person  educated: Patient Education method: Programmer, multimedia, Demonstration, Verbal cues, and Handouts Education comprehension: verbalized understanding, returned demonstration, and verbal cues required  HOME EXERCISE PROGRAM: Access Code: [email or web address] URL: [email or web address] Date: 07/27/2023 Prepared by: Bonna Bustard  Exercises - Sit to Stand  - 3 x daily - 7 x weekly - 1 sets - 10 reps - Supine Heel Slide  - 3-5 x daily - 7 x weekly - 1 sets - 10 reps - 5 hold - Supine Heel Slide with Strap (Mirrored)  - 3-5 x daily - 7 x weekly - 1 sets - 10 reps - 5 hold - Supine Quadricep Sets  - 3-5 x daily - 7 x weekly - 1 sets - 10 reps - 5 hold - Seated Long Arc Quad  - 3-5 x daily - 7 x weekly - 1 sets - 5-10 reps - 2 hold - Seated Quad Set  - 3-5 x daily - 7 x weekly - 1 sets - 10 reps - 5 hold  ASSESSMENT:  CLINICAL IMPRESSION: Flexion limitations still noted.  Balance and strength are improving well to this point.  Swelling/edema still noted in Rt leg , even in morning time.  Continued skilled PT services indicated at this time.   OBJECTIVE IMPAIRMENTS: Abnormal gait, decreased activity tolerance, decreased balance, decreased coordination, decreased endurance, decreased mobility, difficulty walking, decreased ROM, decreased strength, hypomobility, increased edema, increased fascial restrictions, impaired perceived functional ability, increased muscle spasms, impaired flexibility, improper body mechanics, and pain.   ACTIVITY LIMITATIONS: lifting, bending, sitting, standing, squatting, stairs, transfers, and locomotion level  PARTICIPATION LIMITATIONS: meal prep, cleaning, laundry, interpersonal relationship,  driving, shopping, community activity, and occupation  PERSONAL FACTORS:  Medical hx: anemia, back pain, hyperlipidemia, OA rt knee  are also affecting patient's functional outcome.   REHAB POTENTIAL: Good  CLINICAL DECISION MAKING: Stable/uncomplicated  EVALUATION COMPLEXITY:  Low   GOALS: Goals reviewed with patient? Yes  SHORT TERM GOALS: (target date for Short term goals are 3 weeks 08/17/2023)   1.  Patient will demonstrate independent use of home exercise program to maintain progress from in clinic treatments.  Goal status: Met 08/16/2023  LONG TERM GOALS: (target dates for all long term goals are 10 weeks  10/05/2023 )   1. Patient will demonstrate/report pain at worst less than or equal to 2/10 to facilitate minimal limitation in daily activity secondary to pain symptoms.  Goal status: on going 08/16/2023   2. Patient will demonstrate independent use of home exercise program to facilitate ability to maintain/progress functional gains from skilled physical therapy services.  Goal status: on going 08/16/2023   3. Patient will demonstrate Patient specific functional scale avg > or = 8/10 to indicate reduced disability due to condition.   Goal status: on going 08/16/2023   4.  Patient will demonstrate Rt LE MMT 5/5 throughout to faciltiate usual transfers, stairs, squatting at [facility] for daily life.   Goal status: on going 08/16/2023   5.  Patient will demonstrate Rt knee 0-110 deg to facilitate transfers, squatting, and ambulation at PLOF s deviation.  Goal status: on going 08/16/2023   6.  Patient will demonstrate bilateral SLS > 15 seconds to facilitate stability in ambulation on even and uneven surfaces.  Goal status: on going 08/16/2023   7.  Patient will demonstrate ascending/descending stairs reciprocally s UE assist for community integration.   Goal Status: on going 08/16/2023   PLAN:  PT FREQUENCY: 1-2x/week  PT DURATION: 10 weeks  PLANNED INTERVENTIONS: Can include 96045- PT Re-evaluation, 97110-Therapeutic exercises, 97530- Therapeutic activity, 97112- Neuromuscular re-education, 97535- Self Care, 97140- Manual therapy, [phone]- Gait training, [phone]- Orthotic Fit/training, [phone]- Canalith repositioning, [id number]- Aquatic Therapy, [phone]- Electrical  stimulation (unattended), [phone]- Electrical stimulation (manual), [id number] Physical performance testing, [id number]- Vasopneumatic device, [id number]- Ultrasound, [id number]- Traction (mechanical), [id number]- Ionotophoresis 4mg /ml Dexamethasone , Patient/Family education, Balance training, Stair training, Taping, Dry Needling, Joint mobilization, Joint manipulation, Spinal manipulation, Spinal mobilization, Scar mobilization, Vestibular training, Visual/preceptual remediation/compensation, DME instructions, Cryotherapy, and Moist heat.  All performed as medically necessary.  All included unless contraindicated  PLAN FOR NEXT SESSION: Flexion mobility gains.    Bonna Bustard, PT, DPT, OCS, ATC 09/04/23  10:54 AM

## 2023-09-06 ENCOUNTER — Encounter: Payer: Self-pay | Admitting: Physical Therapy

## 2023-09-06 ENCOUNTER — Ambulatory Visit (INDEPENDENT_AMBULATORY_CARE_PROVIDER_SITE_OTHER): Admitting: Physical Therapy

## 2023-09-06 DIAGNOSIS — G8929 Other chronic pain: Secondary | ICD-10-CM

## 2023-09-06 DIAGNOSIS — M6281 Muscle weakness (generalized): Secondary | ICD-10-CM | POA: Diagnosis not present

## 2023-09-06 DIAGNOSIS — R262 Difficulty in walking, not elsewhere classified: Secondary | ICD-10-CM | POA: Diagnosis not present

## 2023-09-06 DIAGNOSIS — R600 Localized edema: Secondary | ICD-10-CM | POA: Diagnosis not present

## 2023-09-06 DIAGNOSIS — M25561 Pain in right knee: Secondary | ICD-10-CM

## 2023-09-06 NOTE — Therapy (Signed)
 OUTPATIENT PHYSICAL THERAPY   Progress Note reporting period 07/27/23 to 09/06/23  See below for objective and subjective measurements relating to patients progress with PT. PHYSICAL THERAPY DISCHARGE SUMMARY  Visits from Start of Care: 10  Current functional level related to goals / functional outcomes: See below   Remaining deficits: See below   Education / Equipment: HEP  Plan:  Patient goals were mostlyt met. Patient is being discharged as she is at good functional level, she feels ready to discharge to independent program and she will work hard at home to gain the last little bit of knee flexion she is missing.      Patient Name: Victoria [name] MRN: [id number] DOB:Oct 10, 1960, 63 y.o., female Today's Date: 09/06/2023  END OF SESSION:  PT End of Session - 09/06/23 1353     Visit Number 10    Number of Visits 20    Date for PT Re-Evaluation 10/05/23    Authorization Type [employer] 15% coinsurance - 60 visits    Progress Note Due on Visit 20    PT Start Time 1345    PT Stop Time 1430    PT Time Calculation (min) 45 min    Activity Tolerance Patient tolerated treatment well    Behavior During Therapy [facility] for tasks assessed/performed                   Past Medical History:  Diagnosis Date   Acute bronchitis 02/15/2023   Allergic rhinitis    Anemia    Anemia, iron deficiency    Back pain    Gallbladder problem    Heavy menses    Hyperglycemia 02/22/2016   Hyperlipidemia    Lipoma    on L arm   Osteoarthritis of right knee 05/2023   Pre-diabetes    SOBOE (shortness of breath on exertion)    Uterine fibroid    Vitamin D  deficiency    Past Surgical History:  Procedure Laterality Date   CESAREAN SECTION     [id number]   CHOLECYSTECTOMY  11/07/2011   ENDOMETRIAL ABLATION  2010   fibroids   PARTIAL KNEE ARTHROPLASTY Right 06/01/2023   Procedure: Right unicondylar knee arthroplasty;  Surgeon: Elner Hahn, MD;  Location: [facility] ORS;  Service:  Orthopedics;  Laterality: Right;   Pelvic U/S and transvaginal  04/27/2009   fibroids x 2   TONSILLECTOMY  1976   TUBAL LIGATION     Uterine ultrasound  06/1998   and endo biopsy- neg   Patient Active Problem List   Diagnosis Date Noted   Routine general medical examination at a health care facility 08/08/2023   Situational anxiety 08/01/2022   Skin lesion 08/01/2022   Encounter for screening mammogram for breast cancer 07/27/2021   Herpes simplex 07/27/2021   Vitamin D  deficiency 08/06/2020   At risk for osteoporosis 08/06/2020   History of iron deficiency 08/06/2020   Back pain 07/23/2020   Anemia 07/23/2020   At risk for heart disease 07/23/2020   H/O small bowel obstruction 08/28/2018   Prediabetes 06/01/2018   Panic attacks 02/22/2016   Encounter for routine gynecological examination 09/02/2014   Chest pain 11/20/2011   Special screening for malignant neoplasms, colon 06/08/2011   Hyperlipidemia 06/08/2011   Encounter for general adult medical examination with abnormal findings 06/01/2011   LIPOMA 12/30/2009   FIBROIDS, UTERUS 04/27/2009   Obesity 03/19/2007   ALLERGIC RHINITIS 02/27/2007    PCP: Clemens Curt MD  REFERRING PROVIDER: Elner Hahn,  MD  REFERRING DIAG: Y86.578 (ICD-10-CM) - Presence of right artificial knee joint M17.11 (ICD-10-CM) - Unilateral primary osteoarthritis, right knee  THERAPY DIAG:  Chronic pain of right knee  Muscle weakness (generalized)  Difficulty in walking, not elsewhere classified  Localized edema  Rationale for Evaluation and Treatment: Rehabilitation  ONSET DATE: 07/14/2023 Partial Rt TKA  SUBJECTIVE:   SUBJECTIVE STATEMENT: Pt indicated no pain, just has some stiffness  PERTINENT HISTORY: Medical hx: anemia, back pain, hyperlipidemia, OA rt knee  PAIN:  NPRS scale: 0/10 upon arrival.  Pain location: Rt knee  Pain description: irritation Aggravating factors: prolonged WB activity, sitting prolonged Relieving  factors: icing  PRECAUTIONS: None  WEIGHT BEARING RESTRICTIONS: No  FALLS:  Has patient fallen in last 6 months? No  LIVING ENVIRONMENT: Lives in: House/apartment Stairs: stairs to enter with railing both side:  7 steps.  Has second floor but not to bedroom.  Has following equipment at home: has walker, cane.    OCCUPATION: work requires standing/walking.  Work 5-6hrs most of the week.   PLOF: Independent, walking.  Housework.   PATIENT GOALS: Reduce pain  OBJECTIVE:   PATIENT SURVEYS:  Patient-Specific Activity Scoring Scheme  "0" represents "unable to perform." "10" represents "able to perform at prior level. 0 1 2 3 4 5 6 7 8 9  10 (Date and Score)   Activity 07/27/2023 09/06/23   1. Putting shoes on  6  9  2. Walking at work  5  8  3. Sitting prolonged 5 9  4.    5.    Score 5.333 8.66   Total score = sum of the activity scores/number of activities Minimum detectable change (90%CI) for average score = 2 points Minimum detectable change (90%CI) for single activity score = 3 points  COGNITION: 07/27/2023 Overall cognitive status: WFL    SENSATION: 07/27/2023 Not tested  EDEMA:  07/27/2023 Localized edema Rt knee, lower leg.   MUSCLE LENGTH: 07/27/2023   POSTURE:  07/27/2023 No Significant postural limitations  PALPATION: 07/27/2023 Mild tenderness incision to light touch.   LOWER EXTREMITY ROM:   ROM A: active, P: passive Right 07/27/2023 Left 07/27/23 Rt 08/01/23 Right 08/07/2023 Right 08/21/23 Right 08/28/2023 Right 09/06/23  Hip flexion         Hip extension         Hip abduction         Hip adduction         Hip internal rotation         Hip external rotation         Knee flexion 85 AROM in supine heel slide  A: 80 P: 86 Supine heel slide AROM in supine heel slide 90 A:93 P:95 AROM in supine heel slide: 96   AAROM 100  Knee extension -8 AROM in seated LAQ        Ankle dorsiflexion         Ankle plantarflexion         Ankle inversion          Ankle eversion          (Blank rows = not tested)  LOWER EXTREMITY MMT:  MMT Right 07/27/2023 Left 07/27/2023 Right 09/06/23  Hip flexion 4/5 5/5   Hip extension     Hip abduction     Hip adduction     Hip internal rotation     Hip external rotation     Knee flexion 5/5    Knee extension 4/5 5/5 5/5  Ankle dorsiflexion 5/5 5/5 5/5  Ankle plantarflexion     Ankle inversion     Ankle eversion      (Blank rows = not tested)  LOWER EXTREMITY SPECIAL TESTS:  07/27/2023 No specific testing  FUNCTIONAL TESTS:  07/27/2023 18 inch chair transfer:  hands on knees  Lt SLS: 12 seconds Rt SLS: 4 seconds  09/06/23 Rt SLS : 30 sec   GAIT: 07/27/2023 Independent ambulation with reduced knee flexion in swing and mild decreased in stance on Rt leg.                                                                                                                                                                        TODAY'S TREATMENT                                                                          DATE: 09/06/2023 Therex: Nu step L5 X 10 min UE/LE seat #8  Updated measurements and goals Seated knee flexion AAROM stretch 5 sec X 10  Manual: Seated Rt knee flexion c distraction, IR with contralateral leg movement opposite.  Contract relax to Rt quad for flexion mobility gains.   TherActivity (to improve transfers, stairs) Leg press double leg 75 lbs 2X10  single leg 56 lbs Rt leg 2 x 10   - performed in max knee flexion tolerated Step up and down,forward 6 inch step up with right and down in front with left (to simulate reciprocal pattern and encourage knee flexion ROM) x 10 Lateral step up and over 6 inch X 10 bilat   .  Vasopneumatic 34 deg Rt knee  in elevation medium compression  X10 min  TODAY'S TREATMENT                                                                          DATE: 09/04/2023 Therex: Recumbent bike partial revolutions stretching 5 sec 8 mins, seat  4 Gastroc stretch 30 sec X 3 bilat on slantboard  Neuro Re-ed (improve neuromuscular recruitment, balance control) SLS on foam with contralateral leg corner touching x 15 WB on Rt leg, x 8 on Lt leg  SLS with contralateral leg  step over and back 6 inch hurdle with focus on knee flexion x 15 bilateral   Manual: Seated Rt knee flexion c distraction, IR with contralateral leg movement opposite.  Contract relax to Rt quad for flexion mobility gains.   TherActivity (to improve transfers, stairs) Leg press double leg 87 lbs  x15  single leg 50 lbs Rt leg 2 x 15   - performed in max knee flexion tolerated Step up forward 6 inch step x 15 WB on Rt leg Step on over and down attempts on 6 inch step x 5 but limited by pain/knee flexion on Rt leg.   .  Vasopneumatic 34 deg Rt knee  in elevation medium compression  X10 min  TODAY'S TREATMENT                                                                          DATE: 08/28/2023 Therex: Nustep lvl 6 for 9 mins seat #8 for ROM using UE/LE. Gastroc stretch 30 sec X 3 bilat on slantboard Seated Rt leg LAQ c end range pauses 2 x 15 4 lbs Seated knee flexion stretch 5 sec X 10 AAROM Seated quad set 5 sec hold x 10 Rt leg Seated quad set with SLR slowly Rt leg x 15 Supine Rt knee LAQ in 90 deg hip flexion for swelling muscle pump 2 x 10     Manual: Seated Rt knee flexion c distraction, IR with contralateral leg movement opposite.  Contract relax to Rt quad for flexion mobility gains.   TherActivity (to improve transfers, stairs) Leg press double leg 87 lbs 2 x15  single leg 50 lbs Rt leg x 15   - performed in max knee flexion tolerated  .  Vasopneumatic 34 deg Rt knee, Rt ankle  in elevation medium compression  X10 min   PATIENT EDUCATION:  07/27/2023 Education details: HEP, POC Person educated: Patient Education method: Programmer, multimedia, Demonstration, Verbal cues, and Handouts Education comprehension: verbalized understanding, returned  demonstration, and verbal cues required  HOME EXERCISE PROGRAM: Access Code: [email or web address] URL: [email or web address] Date: 07/27/2023 Prepared by: Bonna Bustard  Exercises - Sit to Stand  - 3 x daily - 7 x weekly - 1 sets - 10 reps - Supine Heel Slide  - 3-5 x daily - 7 x weekly - 1 sets - 10 reps - 5 hold - Supine Heel Slide with Strap (Mirrored)  - 3-5 x daily - 7 x weekly - 1 sets - 10 reps - 5 hold - Supine Quadricep Sets  - 3-5 x daily - 7 x weekly - 1 sets - 10 reps - 5 hold - Seated Long Arc Quad  - 3-5 x daily - 7 x weekly - 1 sets - 5-10 reps - 2 hold - Seated Quad Set  - 3-5 x daily - 7 x weekly - 1 sets - 10 reps - 5 hold  ASSESSMENT:  CLINICAL IMPRESSION: 10th visit progress notes shows she is making good progress overall.  Patient goals were mostlyt met. Patient is being discharged as she is at good functional level, she feels ready to discharge to independent program and she will work hard at home to gain the last little bit of knee flexion she is  missing.   OBJECTIVE IMPAIRMENTS: Abnormal gait, decreased activity tolerance, decreased balance, decreased coordination, decreased endurance, decreased mobility, difficulty walking, decreased ROM, decreased strength, hypomobility, increased edema, increased fascial restrictions, impaired perceived functional ability, increased muscle spasms, impaired flexibility, improper body mechanics, and pain.   ACTIVITY LIMITATIONS: lifting, bending, sitting, standing, squatting, stairs, transfers, and locomotion level  PARTICIPATION LIMITATIONS: meal prep, cleaning, laundry, interpersonal relationship, driving, shopping, community activity, and occupation  PERSONAL FACTORS:  Medical hx: anemia, back pain, hyperlipidemia, OA rt knee  are also affecting patient's functional outcome.   REHAB POTENTIAL: Good  CLINICAL DECISION MAKING: Stable/uncomplicated  EVALUATION COMPLEXITY: Low   GOALS: Goals reviewed with patient?  Yes  SHORT TERM GOALS: (target date for Short term goals are 3 weeks 08/17/2023)   1.  Patient will demonstrate independent use of home exercise program to maintain progress from in clinic treatments.  Goal status: Met 08/16/2023  LONG TERM GOALS: (target dates for all long term goals are 10 weeks  10/05/2023 )   1. Patient will demonstrate/report pain at worst less than or equal to 2/10 to facilitate minimal limitation in daily activity secondary to pain symptoms.  Goal status: met 09/06/23 2. Patient will demonstrate independent use of home exercise program to facilitate ability to maintain/progress functional gains from skilled physical therapy services.  Goal status: met 09/06/23   3. Patient will demonstrate Patient specific functional scale avg > or = 8/10 to indicate reduced disability due to condition.   Goal status: MET 09/06/23   4.  Patient will demonstrate Rt LE MMT 5/5 throughout to faciltiate usual transfers, stairs, squatting at [facility] for daily life.   Goal status: MET 09/06/23 5.  Patient will demonstrate Rt knee 0-110 deg to facilitate transfers, squatting, and ambulation at PLOF s deviation.  Goal status: some met 09/06/2023   6.  Patient will demonstrate bilateral SLS > 15 seconds to facilitate stability in ambulation on even and uneven surfaces.  Goal status: MET 09/06/23   7.  Patient will demonstrate ascending/descending stairs reciprocally s UE assist for community integration.   Goal Status: mostly met 09/06/23   PLAN:  PT FREQUENCY: 1-2x/week  PT DURATION: 10 weeks  PLANNED INTERVENTIONS: Can include 57846- PT Re-evaluation, 97110-Therapeutic exercises, 97530- Therapeutic activity, 97112- Neuromuscular re-education, 97535- Self Care, 97140- Manual therapy, [phone]- Gait training, [phone]- Orthotic Fit/training, [phone]- Canalith repositioning, [id number]- Aquatic Therapy, [phone]- Electrical stimulation (unattended), [phone]- Electrical stimulation (manual), [id number] Physical  performance testing, 97016- Vasopneumatic device, [id number]- Ultrasound, [id number]- Traction (mechanical), [id number]- Ionotophoresis 4mg /ml Dexamethasone , Patient/Family education, Balance training, Stair training, Taping, Dry Needling, Joint mobilization, Joint manipulation, Spinal manipulation, Spinal mobilization, Scar mobilization, Vestibular training, Visual/preceptual remediation/compensation, DME instructions, Cryotherapy, and Moist heat.  All performed as medically necessary.  All included unless contraindicated  PLAN FOR NEXT SESSION: DC today  Jamee Mazzoni, PT, DPT 09/06/23 2:21 PM

## 2023-09-11 ENCOUNTER — Encounter: Admitting: Rehabilitative and Restorative Service Providers"

## 2023-09-13 ENCOUNTER — Encounter: Admitting: Rehabilitative and Restorative Service Providers"

## 2023-09-18 ENCOUNTER — Encounter: Admitting: Physical Therapy

## 2023-09-18 LAB — HM MAMMOGRAPHY

## 2023-09-20 ENCOUNTER — Encounter: Admitting: Rehabilitative and Restorative Service Providers"

## 2023-09-25 ENCOUNTER — Encounter: Admitting: Physical Therapy

## 2023-09-27 ENCOUNTER — Encounter: Admitting: Rehabilitative and Restorative Service Providers"

## 2023-11-01 ENCOUNTER — Encounter: Payer: Self-pay | Admitting: Podiatry

## 2023-11-01 ENCOUNTER — Ambulatory Visit: Admitting: Podiatry

## 2023-11-01 ENCOUNTER — Ambulatory Visit (INDEPENDENT_AMBULATORY_CARE_PROVIDER_SITE_OTHER)

## 2023-11-01 DIAGNOSIS — M722 Plantar fascial fibromatosis: Secondary | ICD-10-CM

## 2023-11-01 DIAGNOSIS — M7751 Other enthesopathy of right foot: Secondary | ICD-10-CM

## 2023-11-01 MED ORDER — TRIAMCINOLONE ACETONIDE 10 MG/ML IJ SUSP
10.0000 mg | Freq: Once | INTRAMUSCULAR | Status: AC
  Administered 2023-11-01: 10 mg via INTRA_ARTICULAR

## 2023-11-01 MED ORDER — DICLOFENAC SODIUM 75 MG PO TBEC: 75.0000 mg | DELAYED_RELEASE_TABLET | Freq: Two times a day (BID) | ORAL | 2 refills | Status: AC

## 2023-11-02 NOTE — Progress Notes (Signed)
 Subjective:   Patient ID: Victoria [name], female   DOB: 63 y.o.   MRN: [id number]   HPI Patient has had significant increase in pain in the plantar fascia of both feet.  States that they are making it harder for her to walk or be active and its gotten very bad over the last 3 months.  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  I then had pedorthist evaluate the patient and casted for functional orthotic devices to reduce the strain on the heel and arch and patient will be seen back when those are ready or earlier if needed

## 2023-11-06 ENCOUNTER — Other Ambulatory Visit (INDEPENDENT_AMBULATORY_CARE_PROVIDER_SITE_OTHER)

## 2023-11-06 ENCOUNTER — Ambulatory Visit: Payer: Self-pay | Admitting: Family Medicine

## 2023-11-06 DIAGNOSIS — E7849 Other hyperlipidemia: Secondary | ICD-10-CM

## 2023-11-06 LAB — LIPID PANEL
Cholesterol: 201 mg/dL — ABNORMAL HIGH (ref 0–200)
HDL: 43.7 mg/dL (ref >39.00)
LDL Cholesterol: 138 mg/dL — ABNORMAL HIGH (ref 0–99)
NonHDL: 156.87
Total CHOL/HDL Ratio: 5
Triglycerides: 94 mg/dL (ref 0.0–149.0)
VLDL: 18.8 mg/dL (ref 0.0–40.0)

## 2023-12-04 ENCOUNTER — Other Ambulatory Visit

## 2023-12-04 ENCOUNTER — Other Ambulatory Visit: Payer: Self-pay | Admitting: Family Medicine

## 2023-12-04 NOTE — Telephone Encounter (Unsigned)
 Copied from CRM [id number]. Topic: Clinical - Medication Refill >> Dec 04, 2023 10:25 AM Thersia C wrote: Medication: ALPRAZolam  (XANAX ) 0.5 MG tablet  Has the patient contacted their pharmacy? Yes (Agent: If no, request that the patient contact the pharmacy for the refill. If patient does not wish to contact the pharmacy document the reason why and proceed with request.) (Agent: If yes, when and what did the pharmacy advise?)  This is the patient's preferred pharmacy:  [id number] [place], [place] - [address] [place] [address] Phone: [phone] Fax: [phone]  Is this the correct pharmacy for this prescription? Yes If no, delete pharmacy and type the correct one.   Has the prescription been filled recently? No  Is the patient out of the medication? Yes  Has the patient been seen for an appointment in the last year OR does the patient have an upcoming appointment? Yes  Can we respond through MyChart? Yes  Agent: Please be advised that Rx refills may take up to 3 business days. We ask that you follow-up with your pharmacy.

## 2023-12-04 NOTE — Telephone Encounter (Signed)
 FYI Only or Action Required?: Action required by provider: medication refill request.  Patient was last seen in primary care on 08/08/2023 by Randeen Laine LABOR, MD.  Called Nurse Triage reporting No chief complaint on file..  Symptoms began today.  Interventions attempted: Nothing.  Symptoms are: stable.  Triage Disposition: No disposition on file.  Patient/caregiver understands and will follow disposition?:

## 2023-12-05 ENCOUNTER — Ambulatory Visit

## 2023-12-05 MED ORDER — ALPRAZOLAM 0.5 MG PO TABS: 0.5000 mg | ORAL_TABLET | Freq: Two times a day (BID) | ORAL | 0 refills | Status: AC | PRN

## 2023-12-05 NOTE — Progress Notes (Signed)
Patient presents today to pick up custom molded foot orthotics, diagnosed with Plantar Fasciitis by Dr. Charlsie Merles.   Orthotics were dispensed and fit was satisfactory. Reviewed instructions for break-in and wear. Written instructions given to patient.  Patient will follow up as needed.   Addison Bailey Cped, CFo, CFm

## 2023-12-05 NOTE — Telephone Encounter (Signed)
 Name of Medication: Xanax  Name of Pharmacy: [facility] [address] Last Fill or Written Date and Quantity: 10/12/22 #10 tab/ 0 refill Last Office Visit and Type: CPE 08/08/23 Next Office Visit and Type: none scheduled
